# Patient Record
Sex: Male | Born: 1935 | Race: White | Hispanic: No | Smoking: Never smoker
Health system: Southern US, Community
[De-identification: ages and names within clinical notes are randomized; demographics above are authoritative.]

## PROBLEM LIST (undated history)

## (undated) DIAGNOSIS — I219 Acute myocardial infarction, unspecified: Secondary | ICD-10-CM

## (undated) DIAGNOSIS — L570 Actinic keratosis: Secondary | ICD-10-CM

## (undated) DIAGNOSIS — C8590 Non-Hodgkin lymphoma, unspecified, unspecified site: Secondary | ICD-10-CM

## (undated) HISTORY — DX: Non-Hodgkin lymphoma, unspecified, unspecified site: C85.90

## (undated) HISTORY — PX: CARDIAC SURGERY: SHX584

## (undated) HISTORY — DX: Actinic keratosis: L57.0

## (undated) HISTORY — PX: BACK SURGERY: SHX140

## (undated) HISTORY — PX: PROSTATE SURGERY: SHX751

## (undated) HISTORY — DX: Acute myocardial infarction, unspecified: I21.9

---

## 1989-10-03 DIAGNOSIS — C8590 Non-Hodgkin lymphoma, unspecified, unspecified site: Secondary | ICD-10-CM

## 1989-10-03 DIAGNOSIS — I2510 Atherosclerotic heart disease of native coronary artery without angina pectoris: Secondary | ICD-10-CM | POA: Diagnosis present

## 1989-10-03 DIAGNOSIS — R079 Chest pain, unspecified: Secondary | ICD-10-CM | POA: Diagnosis present

## 1989-10-03 HISTORY — DX: Non-Hodgkin lymphoma, unspecified, unspecified site: C85.90

## 2006-03-28 ENCOUNTER — Other Ambulatory Visit: Payer: Self-pay

## 2006-03-28 ENCOUNTER — Emergency Department: Payer: Self-pay | Admitting: Unknown Physician Specialty

## 2006-03-29 ENCOUNTER — Ambulatory Visit: Payer: Self-pay | Admitting: Unknown Physician Specialty

## 2006-03-30 ENCOUNTER — Ambulatory Visit: Payer: Self-pay | Admitting: Internal Medicine

## 2006-03-31 ENCOUNTER — Ambulatory Visit: Payer: Self-pay | Admitting: Surgery

## 2006-04-09 ENCOUNTER — Other Ambulatory Visit: Payer: Self-pay

## 2006-04-09 ENCOUNTER — Inpatient Hospital Stay: Payer: Self-pay | Admitting: Internal Medicine

## 2006-10-03 HISTORY — PX: ROTATOR CUFF REPAIR: SHX139

## 2007-01-08 ENCOUNTER — Ambulatory Visit: Payer: Self-pay | Admitting: Internal Medicine

## 2007-01-22 ENCOUNTER — Ambulatory Visit: Payer: Self-pay | Admitting: Internal Medicine

## 2007-01-23 ENCOUNTER — Ambulatory Visit: Payer: Self-pay | Admitting: Unknown Physician Specialty

## 2007-01-30 ENCOUNTER — Ambulatory Visit: Payer: Self-pay | Admitting: Unknown Physician Specialty

## 2007-02-23 ENCOUNTER — Ambulatory Visit: Payer: Self-pay | Admitting: Unknown Physician Specialty

## 2007-02-23 ENCOUNTER — Other Ambulatory Visit: Payer: Self-pay

## 2007-02-27 ENCOUNTER — Ambulatory Visit: Payer: Self-pay | Admitting: Unknown Physician Specialty

## 2007-03-09 ENCOUNTER — Encounter: Payer: Self-pay | Admitting: General Practice

## 2007-04-03 ENCOUNTER — Encounter: Payer: Self-pay | Admitting: General Practice

## 2007-05-04 ENCOUNTER — Encounter: Payer: Self-pay | Admitting: General Practice

## 2007-06-04 ENCOUNTER — Encounter: Payer: Self-pay | Admitting: General Practice

## 2007-06-12 ENCOUNTER — Ambulatory Visit: Payer: Self-pay | Admitting: Pain Medicine

## 2007-07-04 ENCOUNTER — Encounter: Payer: Self-pay | Admitting: General Practice

## 2007-08-02 ENCOUNTER — Ambulatory Visit: Payer: Self-pay | Admitting: Pain Medicine

## 2007-08-16 ENCOUNTER — Ambulatory Visit: Payer: Self-pay | Admitting: Physician Assistant

## 2007-09-13 ENCOUNTER — Ambulatory Visit: Payer: Self-pay | Admitting: Physician Assistant

## 2009-06-15 ENCOUNTER — Ambulatory Visit: Payer: Self-pay | Admitting: Physician Assistant

## 2009-06-15 ENCOUNTER — Inpatient Hospital Stay: Payer: Self-pay | Admitting: Internal Medicine

## 2009-06-17 DIAGNOSIS — I219 Acute myocardial infarction, unspecified: Secondary | ICD-10-CM

## 2009-06-17 HISTORY — DX: Acute myocardial infarction, unspecified: I21.9

## 2009-07-30 ENCOUNTER — Encounter: Payer: Self-pay | Admitting: Internal Medicine

## 2009-08-03 ENCOUNTER — Encounter: Payer: Self-pay | Admitting: Internal Medicine

## 2009-09-02 ENCOUNTER — Encounter: Payer: Self-pay | Admitting: Internal Medicine

## 2010-02-17 ENCOUNTER — Ambulatory Visit: Payer: Self-pay | Admitting: Vascular Surgery

## 2010-02-19 ENCOUNTER — Ambulatory Visit: Payer: Self-pay | Admitting: Vascular Surgery

## 2010-02-24 ENCOUNTER — Inpatient Hospital Stay: Payer: Self-pay | Admitting: Vascular Surgery

## 2010-02-24 HISTORY — PX: CAROTID ARTERY ANGIOPLASTY: SHX1300

## 2010-05-13 ENCOUNTER — Emergency Department: Payer: Self-pay | Admitting: Emergency Medicine

## 2010-10-03 HISTORY — PX: EYE SURGERY: SHX253

## 2010-10-03 HISTORY — PX: CATARACT EXTRACTION: SUR2

## 2011-03-31 ENCOUNTER — Ambulatory Visit: Payer: Self-pay | Admitting: Ophthalmology

## 2011-04-18 ENCOUNTER — Ambulatory Visit: Payer: Self-pay | Admitting: Ophthalmology

## 2011-05-11 ENCOUNTER — Ambulatory Visit: Payer: Self-pay | Admitting: Ophthalmology

## 2015-02-23 ENCOUNTER — Ambulatory Visit: Payer: Medicare PPO | Attending: Surgery

## 2015-02-23 DIAGNOSIS — M545 Low back pain, unspecified: Secondary | ICD-10-CM

## 2015-02-23 DIAGNOSIS — M47816 Spondylosis without myelopathy or radiculopathy, lumbar region: Secondary | ICD-10-CM | POA: Diagnosis not present

## 2015-02-23 NOTE — Therapy (Signed)
[place] MAIN [facility] SERVICES [address] [place], [place], [address] Phone: [phone]   Fax:  [phone]  Physical Therapy Evaluation  Patient Details  Name: Travis Schultz MRN: [id number] Date of Birth: July 16, 1926 Referring Provider:  Corky Mull, MD  Encounter Date: 02/23/2015      PT End of Session - 02/23/15 0949    Visit Number 1   Number of Visits 9   Date for PT Re-Evaluation 03/26/15   Authorization Type 1/10   PT Start Time 0800   PT Stop Time 0858   PT Time Calculation (min) 58 min   Activity Tolerance Patient tolerated treatment well   Behavior During Therapy [facility] for tasks assessed/performed      Past Medical History  Diagnosis Date  . Myocardial infarction 2012  . Lymphoma 1991    No past surgical history on file.  There were no vitals filed for this visit.  Visit Diagnosis:  Bilateral low back pain without sciatica      Subjective Assessment - 02/23/15 0807    Subjective lumbar surgery 1971- current pain is different. pt reports 6-8 months ago he fell off a ladder, pt reports he didnt have pain at the time. Pt reports he began to have L sidded lower back pain that wraps around the L hip and sometimes across his back. pt reports the pain is constant and mild  to the L lower back and sometimes is severe  when he does abrupt movements. pt reports he does not have numbness/tingling. pt denies any weakness. pt denies any changea in bowel or bladder. pt reports he can go hit golf balls but is very sore afterwards. pt reports he can sit to relieve the pain sometimes.    Patient Stated Goals reduce pain,    Currently in Pain? Yes   Pain Location Back   Pain Orientation Lower   Pain Descriptors / Indicators Aching   Aggravating Factors  unknown- sometimes abrupt movements    Pain Relieving Factors unknown            [facility] PT Assessment - 02/23/15 0819    Assessment   Medical Diagnosis lower back pain   Onset Date/Surgical Date 08/25/14   Balance Screen   Has the patient fallen in the past 6 months No   Has the patient had a decrease in activity level because of a fear of falling?  No   Is the patient reluctant to leave their home because of a fear of falling?  No   Home Environment   Living Environment Private residence   Living Arrangements Spouse/significant other   Home Access Level entry   [place] One level   Prior Function   Level of Independence Independent;Independent with basic ADLs  golf withoutpain   Observation/Other Assessments   Observations --  guarded movement   ROM / Strength   AROM / PROM / Strength Strength;AROM   AROM   Overall AROM  Deficits   Overall AROM Comments lumbar flexion 50 deg, extension 15 deg, R/L sidebend 30 deg, hip PROM WNL,    Strength   Overall Strength Comments hip flexion 4/5, remaining LE strength 5/5, abdominal strength 2/5, extensor strength 3/5, difficutly initiating TA contraction   Palpation   SI assessment  + LLL test, MET attempted, did not reduce- will retry next visit  - posterior shear, - figure 4, - sacral thrust   Palpation comment pt did not have particular area of tenderness  along L spine, hips, SIJ or surrounding musculature. pt is hypomobile throughout   Ambulation/Gait   Ambulation/Gait Yes   Ambulation/Gait Assistance 7: Independent   Gait Pattern --  reduced pelvic rotation, reduced arm swing   Functional Gait  Assessment   Gait assessed  --      repeated lumbar flexion increased pain, repeated lumbar extension increased pain Myotomes/dermatomes WNL bilaterally Reflexes 1+/bilaterally patellar and achilles Hip strength: hip abd 4/5, adduction 4/5, ER 3+/5, IR 3+/5  Therex: Transverse abdominis contraction in supine, mod verbal, tactile cues for performance and maintaining breath.                           PT Long Term Goals - 03-23-2015 0956    PT LONG TERM GOAL #1   Title pt will  demonstrate full lumbar flexion with pain <3/10 order to pick up a golf ball x5.    Time 4   Period Weeks   Status New   PT LONG TERM GOAL #2   Title pt will demo proper lifting mechanics of 20lb item x 5.    Time 4   Period Weeks   Status New   PT LONG TERM GOAL #3   Title pt will swing a golf club x 5 without pain >3/10 to return to PLOF   Time 4   Period Weeks   Status New   PT LONG TERM GOAL #4   Title pt will perfrom a sit up with arms outstretched demonstrating 3/5 abdominal strength significantly increasing core strength.    Baseline 2/5   Time 4   Period Weeks   Status New               Plan - 2015/03/23 0949    Clinical Impression Statement pt presents with what is now chronic L back pain, mostly L sided. pt is somewhat a poor historian, gathering information on aggs/eases was difficutly. pt did not have response with repeated movement testing, no particular areas of tenderness with bony of soft tissue palpation. pt does demonstrate  reduced ROM, joint mobility, impaired flexibility, impaired strength, especially of the core,. pt does not have symptoms of sciatic symptoms, no motor loss or sensation changes. pt does have + LLL testing for pelvic inomonate rotation.     Pt will benefit from skilled therapeutic intervention in order to improve on the following deficits Decreased range of motion;Pain;Impaired flexibility;Improper body mechanics;Postural dysfunction;Decreased strength;Decreased mobility;Decreased activity tolerance   Rehab Potential Good   PT Frequency 2x / week   PT Duration 4 weeks   PT Treatment/Interventions Traction;Moist Heat;Therapeutic exercise;Manual techniques;Taping;Therapeutic activities;Electrical Stimulation;Aquatic Therapy;Ultrasound;Neuromuscular re-education;Passive range of motion;Dry needling;Patient/family education;Gait training;Functional mobility training   PT Next Visit Plan re-try MET to correct pelvic inomonate    Consulted and Agree  with Plan of Care Patient          G-Codes - 03/23/15 1003    Functional Assessment Tool Used ROM, pain scale, clinical judement    Functional Limitation Changing and maintaining body position   Changing and Maintaining Body Position Current Status (E3154) At least 20 percent but less than 40 percent impaired, limited or restricted   Changing and Maintaining Body Position Goal Status (M0867) At least 1 percent but less than 20 percent impaired, limited or restricted       Problem List There are no active problems to display for this patient.  Gorden Harms. Dereon Williamsen, PT, DPT [phone]  Abigael Mogle 2015/03/23,  10:05 AM  [place] MAIN [facility] SERVICES [address] [place], [place], [address] Phone: [phone]   Fax:  [phone]

## 2015-02-23 NOTE — Patient Instructions (Signed)
TA contraction with breathing, diaphragmatic breathing x 5 min

## 2015-03-09 ENCOUNTER — Ambulatory Visit: Payer: Medicare PPO | Attending: Surgery

## 2015-03-09 DIAGNOSIS — M545 Low back pain, unspecified: Secondary | ICD-10-CM

## 2015-03-09 NOTE — Therapy (Signed)
[place] MAIN [facility] SERVICES [address] [place], [place], [address] Phone: [phone]   Fax:  [phone]  Physical Therapy Treatment  Patient Details  Name: Travis Schultz MRN: [id number] Date of Birth: July 23, 1926 Referring Provider:  Corky Mull, MD  Encounter Date: 03/09/2015      PT End of Session - 03/09/15 0845    Visit Number 2   Number of Visits 9   Date for PT Re-Evaluation 03/26/15   Authorization Type 2/10   PT Start Time 0800   PT Stop Time 0835   PT Time Calculation (min) 35 min   Activity Tolerance Patient tolerated treatment well   Behavior During Therapy [facility] for tasks assessed/performed      Past Medical History  Diagnosis Date  . Myocardial infarction 2012  . Lymphoma 1991    History reviewed. No pertinent past surgical history.  There were no vitals filed for this visit.  Visit Diagnosis:  Bilateral low back pain without sciatica      Subjective Assessment - 03/09/15 0840    Subjective pt reports he has been doing his initial HEP at home and it seems to be helping his R flank pain.    Patient Stated Goals reduce pain,    Currently in Pain? Yes   Pain Score 3    Pain Location Back   Pain Orientation Lower;Right     Therex: Alt UE flexion 2x10, bridges 2x10, alt LE march 2x10, LTR  2x10, double knee to chest 2x10  Pt required mod cues for proper exercise performance both verbal and tactile                    [facility] Adult PT Treatment/Exercise - 03/09/15 0001    Exercises   Exercises Lumbar   Lumbar Exercises: Supine   Bridge --  2x10                PT Education - 03/09/15 0845    Education provided Yes   Education Details diaphragmatic breathing    Person(s) Educated Patient   Methods Explanation   Comprehension Verbalized understanding;Returned demonstration             PT Long Term Goals - 02/23/15 0956    PT LONG TERM GOAL #1   Title pt will  demonstrate full lumbar flexion with pain <3/10 order to pick up a golf ball x5.    Time 4   Period Weeks   Status New   PT LONG TERM GOAL #2   Title pt will demo proper lifting mechanics of 20lb item x 5.    Time 4   Period Weeks   Status New   PT LONG TERM GOAL #3   Title pt will swing a golf club x 5 without pain >3/10 to return to PLOF   Time 4   Period Weeks   Status New   PT LONG TERM GOAL #4   Title pt will perfrom a sit up with arms outstretched demonstrating 3/5 abdominal strength significantly increasing core strength.    Baseline 2/5   Time 4   Period Weeks   Status New               Plan - 03/09/15 4259    Clinical Impression Statement pt tolerated progression of core strengthening well, did not have increased pain. pt had difficulty maintaining normal breathing with TA contractions needing frequent verbal and tactile cues. still unable to reproduce  specific "grabbing" pain.    Pt will benefit from skilled therapeutic intervention in order to improve on the following deficits Decreased range of motion;Pain;Impaired flexibility;Improper body mechanics;Postural dysfunction;Decreased strength;Decreased mobility;Decreased activity tolerance   Rehab Potential Good   PT Frequency 2x / week   PT Duration 4 weeks   PT Treatment/Interventions Traction;Moist Heat;Therapeutic exercise;Manual techniques;Taping;Therapeutic activities;Electrical Stimulation;Aquatic Therapy;Ultrasound;Neuromuscular re-education;Passive range of motion;Dry needling;Patient/family education;Gait training;Functional mobility training   PT Next Visit Plan re-try MET to correct pelvic inomonate         Problem List There are no active problems to display for this patient.  Gorden Harms. Ruffin Lada, PT, DPT [phone]  Jafet Wissing 03/09/2015, 8:52 AM  [place] MAIN [facility] SERVICES [address] [place], [place], [address] Phone: [phone]   Fax:   [phone]

## 2015-03-09 NOTE — Patient Instructions (Addendum)
[email or web address] transverse abdominis contractions with the following: Alt UE flexion 2x10, bridges 2x10, alt LE march 2x10, LTR  2x10, double knee to chest 2x10

## 2015-03-12 ENCOUNTER — Ambulatory Visit: Payer: Medicare PPO

## 2015-03-12 DIAGNOSIS — M545 Low back pain, unspecified: Secondary | ICD-10-CM

## 2015-03-12 NOTE — Therapy (Signed)
[place] MAIN [facility] SERVICES [address] [place], [place], [address] Phone: [phone]   Fax:  [phone]  Physical Therapy Treatment  Patient Details  Name: REQUAN HARDGE MRN: [id number] Date of Birth: 1925/11/23 Referring Provider:  Corky Mull, MD  Encounter Date: 03/12/2015      PT End of Session - 03/12/15 0859    Visit Number 3   Number of Visits 9   Date for PT Re-Evaluation 03/26/15   Authorization Type 3/10   PT Start Time 0802   PT Stop Time 0850   PT Time Calculation (min) 48 min   Activity Tolerance Patient tolerated treatment well   Behavior During Therapy [facility] for tasks assessed/performed      Past Medical History  Diagnosis Date  . Myocardial infarction 2012  . Lymphoma 1991    History reviewed. No pertinent past surgical history.  There were no vitals filed for this visit.  Visit Diagnosis:  Bilateral low back pain without sciatica      Subjective Assessment - 03/12/15 0857    Subjective pt reports he played 9 holes of golf and his L lower back did hurt, but he was able to finish the game.    Patient Stated Goals reduce pain,    Currently in Pain? Yes   Pain Score 3    Pain Location Back   Pain Orientation Lower;Left   Pain Descriptors / Indicators Aching      Therex: PT assessed pelvic alignment: + LLL on the L side MET to correct inomonate rotation followed by shotgun technique which was unsuccessful x 2. Pt then issued R heel lift  Diaphragmatic breathing x 2 min cues min verbal cues and visual cues with hand on abdomin.  Continue diaphragmatic breathing through exercises perform exercise on the exhale: Including: alt LE march, bridges, alt UE flexion, LTR  piriformis stretching 30 s x 2 QL stretching in sidelyingand standing 30s x 2 each Pt reports his pain changed post exercises, reports pain is more central but bilateral at 3/10 Pt required min-max cues at time throughout exercises.                            PT Education - 03/12/15 0858    Education provided Yes   Education Details diaphragmatic breathing during exercises. !   Person(s) Educated Patient   Methods Explanation;Tactile cues;Verbal cues   Comprehension Verbalized understanding;Returned demonstration;Verbal cues required             PT Long Term Goals - 02/23/15 0956    PT LONG TERM GOAL #1   Title pt will demonstrate full lumbar flexion with pain <3/10 order to pick up a golf ball x5.    Time 4   Period Weeks   Status New   PT LONG TERM GOAL #2   Title pt will demo proper lifting mechanics of 20lb item x 5.    Time 4   Period Weeks   Status New   PT LONG TERM GOAL #3   Title pt will swing a golf club x 5 without pain >3/10 to return to PLOF   Time 4   Period Weeks   Status New   PT LONG TERM GOAL #4   Title pt will perfrom a sit up with arms outstretched demonstrating 3/5 abdominal strength significantly increasing core strength.    Baseline 2/5   Time 4   Period Weeks  Status New               Plan - 03/12/15 0859    Clinical Impression Statement modifed therex today to include diaphragmatic breathing throughout exercises with exertion on the exhale as pt continues to not be able to maintain breath and TA contraction during exercises. pt improved his ability with this as session continued. pt reported having less pain following therex in supine, but then more pain bilaterally as he stood up. pt reported feeling a "big stretch" with the QL stretching on the L side. unable to reduce pelvic inomonate rotation today with MET. x 2. PT issued heel lift on the R to compensate for this.    Pt will benefit from skilled therapeutic intervention in order to improve on the following deficits Decreased range of motion;Pain;Impaired flexibility;Improper body mechanics;Postural dysfunction;Decreased strength;Decreased mobility;Decreased activity tolerance   Rehab Potential  Good   PT Frequency 2x / week   PT Duration 4 weeks   PT Treatment/Interventions Traction;Moist Heat;Therapeutic exercise;Manual techniques;Taping;Therapeutic activities;Electrical Stimulation;Aquatic Therapy;Ultrasound;Neuromuscular re-education;Passive range of motion;Dry needling;Patient/family education;Gait training;Functional mobility training   PT Next Visit Plan --        Problem List There are no active problems to display for this patient.  Gorden Harms. Arnette Driggs, PT, DPT [phone]  Rut Betterton 03/12/2015, 9:02 AM  [place] [facility] MAIN [facility] SERVICES [address] [place], [place], [address] Phone: [phone]   Fax:  [phone]

## 2015-03-16 ENCOUNTER — Ambulatory Visit: Payer: Medicare PPO

## 2015-03-16 DIAGNOSIS — M545 Low back pain, unspecified: Secondary | ICD-10-CM

## 2015-03-16 NOTE — Therapy (Signed)
[place] MAIN [facility] SERVICES [address] [place], [place], [address] Phone: [phone]   Fax:  [phone]  Physical Therapy Treatment  Patient Details  Name: Travis Schultz MRN: [id number] Date of Birth: 1925/12/15 Referring Provider:  Corky Mull, MD  Encounter Date: 03/16/2015      PT End of Session - 03/16/15 0911    Visit Number 4   Number of Visits 9   Date for PT Re-Evaluation 03/26/15   Authorization Type 4/10   PT Start Time 0810   PT Stop Time 0852   PT Time Calculation (min) 42 min   Activity Tolerance Patient tolerated treatment well   Behavior During Therapy [facility] for tasks assessed/performed      Past Medical History  Diagnosis Date  . Myocardial infarction 2012  . Lymphoma 1991    History reviewed. No pertinent past surgical history.  There were no vitals filed for this visit.  Visit Diagnosis:  Bilateral low back pain without sciatica      Subjective Assessment - 03/16/15 0843    Subjective pt reports he is having the sharp catching in his back much frequently. he feels stif but is having less pain.    Patient Stated Goals reduce pain,    Currently in Pain? Yes   Pain Score 2    Pain Location Back   Pain Orientation Lower   Pain Descriptors / Indicators Aching     Treatment: LTR with Tball 4x10 Transverse abdominis contraction (TrA) with alt LE/UE marching 2x10 TrA contraction with mini bridging 2x10 TrA contraction with clamshell 2x10 bilaterally  Pavloff press : red band 2x10  Pt requires mod cues to maintain diaphragmatic breathing and proper exercise performance.                             PT Education - 03/16/15 0908    Education provided Yes   Education Details wallet in front pocket due to pelvic inominate, diaphragmatic breathing.     Person(s) Educated Patient   Methods Explanation   Comprehension Verbalized understanding;Returned demonstration              PT Long Term Goals - 02/23/15 0956    PT LONG TERM GOAL #1   Title pt will demonstrate full lumbar flexion with pain <3/10 order to pick up a golf ball x5.    Time 4   Period Weeks   Status New   PT LONG TERM GOAL #2   Title pt will demo proper lifting mechanics of 20lb item x 5.    Time 4   Period Weeks   Status New   PT LONG TERM GOAL #3   Title pt will swing a golf club x 5 without pain >3/10 to return to PLOF   Time 4   Period Weeks   Status New   PT LONG TERM GOAL #4   Title pt will perfrom a sit up with arms outstretched demonstrating 3/5 abdominal strength significantly increasing core strength.    Baseline 2/5   Time 4   Period Weeks   Status New               Plan - 03/16/15 0913    Clinical Impression Statement pt still has difficutly maintaining normal breath volume with diaphragmatic breathing. pt still needs mod to max cues to perform exercises correctly. did progress hip and core strengthening today. pt progressing as he  is having less sharp catching pain, and more stiffness. vs pain.    Pt will benefit from skilled therapeutic intervention in order to improve on the following deficits Decreased range of motion;Pain;Impaired flexibility;Improper body mechanics;Postural dysfunction;Decreased strength;Decreased mobility;Decreased activity tolerance   Rehab Potential Good   PT Frequency 2x / week   PT Duration 4 weeks   PT Treatment/Interventions Traction;Moist Heat;Therapeutic exercise;Manual techniques;Taping;Therapeutic activities;Electrical Stimulation;Aquatic Therapy;Ultrasound;Neuromuscular re-education;Passive range of motion;Dry needling;Patient/family education;Gait training;Functional mobility training    plan: next visit try posterior pelvic tilt, modify/correct exercises     Problem List There are no active problems to display for this patient.   Daphanie Oquendo 03/16/2015, 9:20 AM  [place] MAIN [facility] SERVICES [address] [place], [place], [address] Phone: [phone]   Fax:  [phone]

## 2015-03-19 ENCOUNTER — Ambulatory Visit: Payer: Medicare PPO

## 2015-03-19 DIAGNOSIS — M545 Low back pain, unspecified: Secondary | ICD-10-CM

## 2015-03-19 NOTE — Therapy (Signed)
[place] MAIN [facility] SERVICES [address] [place], [place], [address] Phone: [phone]   Fax:  [phone]  Physical Therapy Treatment  Patient Details  Name: Travis Schultz MRN: [id number] Date of Birth: [birth year] Referring Provider:  Corky Mull, MD  Encounter Date: 03/19/2015      PT End of Session - 03/19/15 0918    Visit Number 5   Number of Visits 9   Date for PT Re-Evaluation 03/26/15   Authorization Type 5/10   PT Start Time 0804   PT Stop Time 5053   PT Time Calculation (min) 40 min   Activity Tolerance Patient tolerated treatment well;No increased pain   Behavior During Therapy [facility] for tasks assessed/performed      Past Medical History  Diagnosis Date  . Myocardial infarction 2012  . Lymphoma 1991    History reviewed. No pertinent past surgical history.  There were no vitals filed for this visit.  Visit Diagnosis:  Bilateral low back pain without sciatica      Subjective Assessment - 03/19/15 0914    Subjective pt reports he had some increased pain after last session when he got in his car, but no more since then. pt is happy with his progress.    Patient Stated Goals reduce pain,    Currently in Pain? No/denies     Therex: DKTC with PT assist 10s x 5 (performed at start and end of session) Posterior pelvic tilt 5s x 10 (performed at start and end of session) Seated on Tball alt LE march with diaphragmatic breathing 2x10 Seated on Tball over headh arm lift with diaphragmatic breathing Seated on Tball pavloff press red band 2x x10 bilaterally Seated on Tball D2 shoulder fleixon red band 2x10 bilaterally Low row red band 2x10 bilaterally on Tball Pt requried min-mod verbal and tactile cues for TA contraction with diaphragmatic breathing and scapular positioning/ engagement,.                             PT Education - 03/19/15 0916    Education provided Yes   Education Details  diaphragmatic breathing    Person(s) Educated Patient   Methods Explanation;Verbal cues;Tactile cues             PT Long Term Goals - 02/23/15 0956    PT LONG TERM GOAL #1   Title pt will demonstrate full lumbar flexion with pain <3/10 order to pick up a golf ball x5.    Time 4   Period Weeks   Status New   PT LONG TERM GOAL #2   Title pt will demo proper lifting mechanics of 20lb item x 5.    Time 4   Period Weeks   Status New   PT LONG TERM GOAL #3   Title pt will swing a golf club x 5 without pain >3/10 to return to PLOF   Time 4   Period Weeks   Status New   PT LONG TERM GOAL #4   Title pt will perfrom a sit up with arms outstretched demonstrating 3/5 abdominal strength significantly increasing core strength.    Baseline 2/5   Time 4   Period Weeks   Status New               Plan - 03/19/15 9767    Clinical Impression Statement progressed therex today to core strengthening on Tball as well as posture/scapular muscle strenghtening.pt  still needs min-mod cues for maintain diaphragmatic breathing during exercies with verbal and tactile cues, but is improving his coordination somewhat. pt responded well to sunrise stretch, reporting "thats the spot that bothers me" pt points to QL, upon palpation in this position pt was tender. progressed stretching program for home also.    Pt will benefit from skilled therapeutic intervention in order to improve on the following deficits Decreased range of motion;Pain;Impaired flexibility;Improper body mechanics;Postural dysfunction;Decreased strength;Decreased mobility;Decreased activity tolerance   Rehab Potential Good   PT Frequency 2x / week   PT Duration 4 weeks   PT Treatment/Interventions Traction;Moist Heat;Therapeutic exercise;Manual techniques;Taping;Therapeutic activities;Electrical Stimulation;Aquatic Therapy;Ultrasound;Neuromuscular re-education;Passive range of motion;Dry needling;Patient/family education;Gait  training;Functional mobility training        Problem List There are no active problems to display for this patient.  Gorden Harms. Travis Schultz, PT, DPT [phone]   Travis Schultz 03/19/2015, 9:22 AM  [place] MAIN [facility] SERVICES [address] [place], [place], [address] Phone: [phone]   Fax:  [phone]

## 2015-03-19 NOTE — Patient Instructions (Signed)
Sunrise stretch L side 30s x 2 Double knee to chest 10s x 5 Issued from [email or web address]

## 2015-03-23 ENCOUNTER — Ambulatory Visit: Payer: Medicare PPO

## 2015-03-23 DIAGNOSIS — M545 Low back pain, unspecified: Secondary | ICD-10-CM

## 2015-03-23 NOTE — Therapy (Signed)
[place] MAIN [facility] SERVICES [address] [place], [place], [address] Phone: [phone]   Fax:  [phone]  Physical Therapy Treatment  Patient Details  Name: AYOUB AREY MRN: [id number] Date of Birth: Dec 16, 1925 Referring Provider:  Corky Mull, MD  Encounter Date: 03/23/2015      PT End of Session - 03/23/15 0905    Visit Number 6   Number of Visits 9   Date for PT Re-Evaluation 03/26/15   Authorization Type 6/10   PT Start Time 0804   PT Stop Time 8338   PT Time Calculation (min) 45 min   Activity Tolerance Patient tolerated treatment well;No increased pain   Behavior During Therapy [facility] for tasks assessed/performed      Past Medical History  Diagnosis Date  . Myocardial infarction 2012  . Lymphoma 1991    History reviewed. No pertinent past surgical history.  There were no vitals filed for this visit.  Visit Diagnosis:  Bilateral low back pain without sciatica      Subjective Assessment - 03/23/15 0903    Subjective pt reports he has been doing his new stretches at home. pt reports only mild pain and tightness, no more catching in the back.    Patient Stated Goals reduce pain,    Pain Score 1    Pain Location Back      Therex:  DKTC with PT assist 10s x 5  Posterior pelvic tilt 5s x 10 on Tball  Seated on Tball alt LE march with diaphragmatic breathing 2x10 Seated on Tball pavloff press red band 2x x10 bilaterally Seated on Tball D2 shoulder fleixon red band 2x10 bilaterally Shoulder horiz abduction red band 2x10 Low row red band 2x10 bilaterally on Tball Ball bridges 2x10 Sunrise stretch x 1 min each side with contract relax for QL  Pt requried min-mod verbal and tactile cues for TA contraction with diaphragmatic breathing and scapular positioning/ engagement,.                           PT Education - 03/23/15 0904    Education provided Yes   Education Details diaphragmatic  breathing  during exertion    Methods Explanation;Verbal cues;Tactile cues   Comprehension Verbalized understanding;Returned demonstration;Verbal cues required;Tactile cues required             PT Long Term Goals - 02/23/15 0956    PT LONG TERM GOAL #1   Title pt will demonstrate full lumbar flexion with pain <3/10 order to pick up a golf ball x5.    Time 4   Period Weeks   Status New   PT LONG TERM GOAL #2   Title pt will demo proper lifting mechanics of 20lb item x 5.    Time 4   Period Weeks   Status New   PT LONG TERM GOAL #3   Title pt will swing a golf club x 5 without pain >3/10 to return to PLOF   Time 4   Period Weeks   Status New   PT LONG TERM GOAL #4   Title pt will perfrom a sit up with arms outstretched demonstrating 3/5 abdominal strength significantly increasing core strength.    Baseline 2/5   Time 4   Period Weeks   Status New               Plan - 03/23/15 0905    Clinical Impression Statement pt still  requires mod cues for proper breathing during exercises to promote TA contraction. pt needs verbal, visual and tactile cues, but is slowely improving. pt no longer has sharp instances of catching pain in his back, but still mild pain/tightness.    Pt will benefit from skilled therapeutic intervention in order to improve on the following deficits Decreased range of motion;Pain;Impaired flexibility;Improper body mechanics;Postural dysfunction;Decreased strength;Decreased mobility;Decreased activity tolerance   Rehab Potential Good   PT Frequency 2x / week   PT Duration 4 weeks   PT Treatment/Interventions Traction;Moist Heat;Therapeutic exercise;Manual techniques;Taping;Therapeutic activities;Electrical Stimulation;Aquatic Therapy;Ultrasound;Neuromuscular re-education;Passive range of motion;Dry needling;Patient/family education;Gait training;Functional mobility training        Problem List There are no active problems to display for this  patient.  Gorden Harms. Kylie Simmonds, PT, DPT [phone]  Brenley Priore 03/23/2015, 9:09 AM  [place] MAIN [facility] SERVICES [address] [place], [place], [address] Phone: [phone]   Fax:  [phone]

## 2015-03-26 ENCOUNTER — Ambulatory Visit: Payer: Medicare PPO

## 2015-03-26 DIAGNOSIS — M545 Low back pain, unspecified: Secondary | ICD-10-CM

## 2015-03-26 NOTE — Therapy (Signed)
[place] MAIN [facility] SERVICES [address] [place], [place], [address] Phone: [phone]   Fax:  [phone]  Physical Therapy Treatment  Patient Details  Name: Travis Schultz MRN: [id number] Date of Birth: 12-18-1925 Referring Provider:  Corky Mull, MD  Encounter Date: 03/26/2015      PT End of Session - 03/26/15 1236    Visit Number 7   Number of Visits 9   Date for PT Re-Evaluation 03/26/15   Authorization Type 7/10   PT Start Time 0802   PT Stop Time 7124   PT Time Calculation (min) 42 min   Activity Tolerance Patient tolerated treatment well;No increased pain   Behavior During Therapy [facility] for tasks assessed/performed      Past Medical History  Diagnosis Date  . Myocardial infarction 2012  . Lymphoma 1991    History reviewed. No pertinent past surgical history.  There were no vitals filed for this visit.  Visit Diagnosis:  Bilateral low back pain without sciatica      Subjective Assessment - 03/26/15 1235    Subjective pt reports he no longer is having much of the L flank pain, just c/o stiffness in the middle of his lower back which eases with movement.    Patient Stated Goals reduce pain,    Currently in Pain? Yes   Pain Score 2    Pain Location Back   Pain Orientation Lower   Pain Descriptors / Indicators --  stiff     Therex: DKTC 10s x 10 LTR with Tball 2x10 Posterior pelvic tilt 5s x 10 Ball bridge 2x10 Alt LE/UE flexion on Tball 2x10 Golf swing with cues for TA contraction  X 8 min Pt education with demonstration/pt performance of his usual exercise routine including bicep curls, chest press etc. With instruction to exhale on the contraction to engage transverse abdominis- pt needs mod -max cues at times.                             PT Education - 03/26/15 1236    Education provided Yes   Education Details pt continues to struggle to perform exercises upon exhale, pt again  educated in this including his golf swing    Person(s) Educated Patient   Methods Explanation;Demonstration   Comprehension Verbalized understanding;Returned demonstration;Verbal cues required;Tactile cues required;Need further instruction             PT Long Term Goals - 02/23/15 0956    PT LONG TERM GOAL #1   Title pt will demonstrate full lumbar flexion with pain <3/10 order to pick up a golf ball x5.    Time 4   Period Weeks   Status New   PT LONG TERM GOAL #2   Title pt will demo proper lifting mechanics of 20lb item x 5.    Time 4   Period Weeks   Status New   PT LONG TERM GOAL #3   Title pt will swing a golf club x 5 without pain >3/10 to return to PLOF   Time 4   Period Weeks   Status New   PT LONG TERM GOAL #4   Title pt will perfrom a sit up with arms outstretched demonstrating 3/5 abdominal strength significantly increasing core strength.    Baseline 2/5   Time 4   Period Weeks   Status New  Plan - 03/26/15 1237    Clinical Impression Statement pt still struggles to perform exercise contractions with exhale at times, needing cues. did progress to his golf swing today with exhale on the swing. pt advised to practice this and his other exercises with focus on performing the contraction upon exhale. pt is progresing however, with regarding to reduced symptom severity and frequency.    Pt will benefit from skilled therapeutic intervention in order to improve on the following deficits Decreased range of motion;Pain;Impaired flexibility;Improper body mechanics;Postural dysfunction;Decreased strength;Decreased mobility;Decreased activity tolerance   Rehab Potential Good   PT Frequency 2x / week   PT Duration 4 weeks   PT Treatment/Interventions Traction;Moist Heat;Therapeutic exercise;Manual techniques;Taping;Therapeutic activities;Electrical Stimulation;Aquatic Therapy;Ultrasound;Neuromuscular re-education;Passive range of motion;Dry  needling;Patient/family education;Gait training;Functional mobility training        Problem List There are no active problems to display for this patient.  Gorden Harms. Velvia Mehrer, PT, DPT [phone]  Romaine Maciolek 03/26/2015, 12:41 PM  [place] MAIN [facility] SERVICES [address] [place], [place], [address] Phone: [phone]   Fax:  [phone]

## 2015-03-30 ENCOUNTER — Ambulatory Visit: Payer: Medicare PPO

## 2015-03-30 DIAGNOSIS — M545 Low back pain, unspecified: Secondary | ICD-10-CM

## 2015-03-30 NOTE — Therapy (Signed)
[place] MAIN [facility] SERVICES [address] [place], [place], [address] Phone: [phone]   Fax:  [phone]  Physical Therapy Treatment  Patient Details  Name: Travis Schultz MRN: [id number] Date of Birth: October 20, 1925 Referring Provider:  Corky Mull, MD  Encounter Date: 03/30/2015      PT End of Session - 03/30/15 1110    Visit Number 8   Number of Visits 9   Date for PT Re-Evaluation 04/27/15   Authorization Type 1/10   PT Start Time 0802   PT Stop Time 0840   PT Time Calculation (min) 38 min   Activity Tolerance Patient tolerated treatment well;No increased pain   Behavior During Therapy [facility] for tasks assessed/performed      Past Medical History  Diagnosis Date  . Myocardial infarction 2012  . Lymphoma 1991    History reviewed. No pertinent past surgical history.  There were no vitals filed for this visit.  Visit Diagnosis:  Bilateral low back pain without sciatica - Plan: PT plan of care cert/re-cert      Subjective Assessment - 03/30/15 1108    Subjective pt reportshe had an increase in his lower back pain over the weekend. pt reports no specific event, but it just feels sore and stiff.    Patient Stated Goals reduce pain,    Currently in Pain? Yes   Pain Score 3    Pain Location Back     Manual therapy Soft tissue massage and IASTM using edge tool to the paraspinals  And QL, including efflurage, petrusague, muscle stripping Myofascial release to the thoracolumbar fascia  Pt needed cues to relax during manual therapy. Pt particularly tender L QL                           PT Education - 03/30/15 1109    Education provided Yes   Education Details lumbar flexion stretching and quadratus lumborum stretching, modified over a smaller boulster than hes been doing.    Person(s) Educated Patient   Methods Explanation   Comprehension Verbalized understanding             PT Long Term  Goals - 03/30/15 1121    PT LONG TERM GOAL #1   Title pt will demonstrate full lumbar flexion with pain <3/10 order to pick up a golf ball x5.    Time 4   Period Weeks   Status Partially Met   PT LONG TERM GOAL #2   Title pt will demo proper lifting mechanics of 20lb item x 5.    Time 4   Period Weeks   Status Partially Met   PT LONG TERM GOAL #3   Title pt will swing a golf club x 5 without pain >3/10 to return to PLOF   Time 4   Period Weeks   Status Partially Met   PT LONG TERM GOAL #4   Title pt will perfrom a sit up with arms outstretched demonstrating 3/5 abdominal strength significantly increasing core strength.    Baseline 2/5   Time 4   Period Weeks   Status Partially Met               Plan - 03/30/15 1111    Clinical Impression Statement pt responded well to manual therapy today. pt has soft tissue tightness/ restriction of the paraspinals and QL L>R. emphasized stretching program again today. pt posutre forward flexed at hips likely  increases the muscle tightness. hip flexors not particularily tight upon exam today.  pt has partially met goals at this time and would benefit from continued skilled PT services  to improve impairments.    Pt will benefit from skilled therapeutic intervention in order to improve on the following deficits Decreased range of motion;Pain;Impaired flexibility;Improper body mechanics;Postural dysfunction;Decreased strength;Decreased mobility;Decreased activity tolerance   Rehab Potential Good   PT Frequency 2x / week   PT Duration 4 weeks   PT Treatment/Interventions Traction;Moist Heat;Therapeutic exercise;Manual techniques;Taping;Therapeutic activities;Electrical Stimulation;Aquatic Therapy;Ultrasound;Neuromuscular re-education;Passive range of motion;Dry needling;Patient/family education;Gait training;Functional mobility training          G-Codes - 27-Apr-2015 1122    Functional Assessment Tool Used ROM, pain scale, clinical judement     Functional Limitation Changing and maintaining body position   Changing and Maintaining Body Position Current Status (N2258) At least 20 percent but less than 40 percent impaired, limited or restricted   Changing and Maintaining Body Position Goal Status (T4621) At least 1 percent but less than 20 percent impaired, limited or restricted      Problem List There are no active problems to display for this patient.  Gorden Harms. Irva Loser, PT, DPT [phone]  Niang Mitcheltree April 27, 2015, 11:24 AM  [place] MAIN [facility] SERVICES [address] [place], [place], [address] Phone: [phone]   Fax:  [phone]

## 2015-04-02 ENCOUNTER — Ambulatory Visit: Payer: Medicare PPO

## 2015-04-02 DIAGNOSIS — M545 Low back pain, unspecified: Secondary | ICD-10-CM

## 2015-04-02 NOTE — Therapy (Signed)
[place] MAIN [facility] SERVICES [address] [place], [place], [address] Phone: [phone]   Fax:  [phone]  Physical Therapy Treatment  Patient Details  Name: OBDULIO MASH MRN: [id number] Date of Birth: 10/27/1925 Referring Provider:  Corky Mull, MD  Encounter Date: 04/02/2015      PT End of Session - 04/02/15 1240    Visit Number 9   Number of Visits 17   Date for PT Re-Evaluation 04/27/15   Authorization Type 2/10   PT Start Time 0802   PT Stop Time 0845   PT Time Calculation (min) 43 min   Activity Tolerance Patient tolerated treatment well;No increased pain   Behavior During Therapy [facility] for tasks assessed/performed      Past Medical History  Diagnosis Date  . Myocardial infarction 2012  . Lymphoma 1991    No past surgical history on file.  There were no vitals filed for this visit.  Visit Diagnosis:  Bilateral low back pain without sciatica     manual therapy: Soft tissue massage and ischemic trigger point release to quadratus lumborum (QL) Patient received dry needling therapy education and acknowledged understanding of risks and benefits of dry needling therapy prior to receiving treatment. Patient voiced understanding of treatment options and elected to proceed with dry needling therapy.  triggerpoint dry needling to the L QL in sidelying. Pt reported feeling the associated light dull ache with the technique.  Followed by QL stretch over boulster  Therex: Seated on Tball: Low row: red band 2x10 pavloff press red band 2x10 each side horiz shoulder abudction 2x10 red band  D2 shoulder flexion red band 2x10 Min-mod cues for proper diaphragmatic breathing and scap positioning  Standing D1 shoulder extension (Bilateral) green band 2x10                           PT Education - 04/02/15 1239    Education provided Yes   Education Details dry needling education, diaphragmatic breathing  education   Person(s) Educated Patient   Methods Explanation   Comprehension Returned demonstration;Verbalized understanding;Tactile cues required;Verbal cues required;Need further instruction             PT Long Term Goals - 03/30/15 1121    PT LONG TERM GOAL #1   Title pt will demonstrate full lumbar flexion with pain <3/10 order to pick up a golf ball x5.    Time 4   Period Weeks   Status Partially Met   PT LONG TERM GOAL #2   Title pt will demo proper lifting mechanics of 20lb item x 5.    Time 4   Period Weeks   Status Partially Met   PT LONG TERM GOAL #3   Title pt will swing a golf club x 5 without pain >3/10 to return to PLOF   Time 4   Period Weeks   Status Partially Met   PT LONG TERM GOAL #4   Title pt will perfrom a sit up with arms outstretched demonstrating 3/5 abdominal strength significantly increasing core strength.    Baseline 2/5   Time 4   Period Weeks   Status Partially Met               Plan - 04/02/15 1242    Clinical Impression Statement pt is starting to need less cues for proper performance of this Therex. pt is doing better with diaphragmatic breathing on the concentric part  of exercises. pt responded well to dry needling, reporting reduced stiffness following.    Pt will benefit from skilled therapeutic intervention in order to improve on the following deficits Decreased range of motion;Pain;Impaired flexibility;Improper body mechanics;Postural dysfunction;Decreased strength;Decreased mobility;Decreased activity tolerance   Rehab Potential Good   PT Frequency 2x / week   PT Duration 4 weeks   PT Treatment/Interventions Traction;Moist Heat;Therapeutic exercise;Manual techniques;Taping;Therapeutic activities;Electrical Stimulation;Aquatic Therapy;Ultrasound;Neuromuscular re-education;Passive range of motion;Dry needling;Patient/family education;Gait training;Functional mobility training        Problem List There are no active problems  to display for this patient.  Gorden Harms. Shamar Kracke, PT, DPT [phone]  Shany Marinez 04/02/2015, 12:44 PM  [place] MAIN [facility] SERVICES [address] [place], [place], [address] Phone: [phone]   Fax:  [phone]

## 2015-04-13 ENCOUNTER — Ambulatory Visit: Payer: Medicare PPO | Attending: Surgery

## 2015-04-13 DIAGNOSIS — M545 Low back pain, unspecified: Secondary | ICD-10-CM

## 2015-04-13 NOTE — Therapy (Signed)
[place] MAIN [facility] SERVICES [address] [place], [place], [address] Phone: [phone]   Fax:  [phone]  Physical Therapy Treatment  Patient Details  Name: Travis Schultz MRN: [id number] Date of Birth: 02/11/1926 Referring Provider:  Corky Mull, MD  Encounter Date: 04/13/2015      PT End of Session - 04/13/15 1330    Visit Number 10   Number of Visits 17   Date for PT Re-Evaluation 04/27/15   Authorization Type 3/10   PT Start Time 1117   PT Stop Time 1200   PT Time Calculation (min) 43 min   Activity Tolerance Patient tolerated treatment well;No increased pain   Behavior During Therapy [facility] for tasks assessed/performed      Past Medical History  Diagnosis Date  . Myocardial infarction 2012  . Lymphoma 1991    History reviewed. No pertinent past surgical history.  There were no vitals filed for this visit.  Visit Diagnosis:  Bilateral low back pain without sciatica      Subjective Assessment - 04/13/15 1327    Subjective pt reports his left low back is sore/stiff this morning after weeding and lifting a bucket with his R UE.  pt relates he felt "wonderful" after his last session.    Patient Stated Goals reduce pain,    Pain Score 3    Pain Location Back   Pain Orientation Left        Therex: Seated on Tball: pavloff press red band 2x10 each side horiz shoulder abudction 2x10 red band  D2 shoulder flexion red band 2x10 Min cues for proper diaphragmatic breathing  Posterior pelvic tilts x10, pt required verbal and tactile cues for correct technique  Alternating marching x10, pt required verbal cues to maintain upright posture versus leaning towards the opposite LE   Standing D1 shoulder extension (Bilateral) green band 2x10 Bilateral Lateral trunk rotation with green theraband 2x10, pt required verbal and tactile from rotating hips   Supine: Knees to chest 2x20 sec Bridges x10 Posterior pelvic  tilts, pt required verbal and tactile cueing for T/A activation   Pt is progressing well with there ex, requiring less verbal and tactile cueing.                         PT Education - 04/13/15 1329    Education provided Yes   Education Details how the ther ex involving his UE help impove core strength    Person(s) Educated Patient   Methods Explanation   Comprehension Verbalized understanding             PT Long Term Goals - 03/30/15 1121    PT LONG TERM GOAL #1   Title pt will demonstrate full lumbar flexion with pain <3/10 order to pick up a golf ball x5.    Time 4   Period Weeks   Status Partially Met   PT LONG TERM GOAL #2   Title pt will demo proper lifting mechanics of 20lb item x 5.    Time 4   Period Weeks   Status Partially Met   PT LONG TERM GOAL #3   Title pt will swing a golf club x 5 without pain >3/10 to return to PLOF   Time 4   Period Weeks   Status Partially Met   PT LONG TERM GOAL #4   Title pt will perfrom a sit up with arms outstretched demonstrating 3/5 abdominal strength  significantly increasing core strength.    Baseline 2/5   Time 4   Period Weeks   Status Partially Met               Plan - 04/13/15 1331    Clinical Impression Statement pt is able to perform ther ex with improved form and less verbal and tactile cueing and tolerated progression of strength exercises well with no increase in pain.  pt would continue to benefit from skilled PT services to improve lifting mechanics and remaining deficits.    Pt will benefit from skilled therapeutic intervention in order to improve on the following deficits Decreased range of motion;Pain;Impaired flexibility;Improper body mechanics;Postural dysfunction;Decreased strength;Decreased mobility;Decreased activity tolerance   Rehab Potential Good   PT Frequency 2x / week   PT Duration 4 weeks   PT Treatment/Interventions Traction;Moist Heat;Therapeutic exercise;Manual  techniques;Taping;Therapeutic activities;Electrical Stimulation;Aquatic Therapy;Ultrasound;Neuromuscular re-education;Passive range of motion;Dry needling;Patient/family education;Gait training;Functional mobility training   PT Next Visit Plan lifting mechanics, HEP         Problem List There are no active problems to display for this patient.  Renford Dills, SPT Renford Dills 04/13/2015, 1:36 PM This entire session was performed under direct supervision and direction of a licensed therapist/therapist assistant . I have personally read, edited and approve of the note as written.  Gorden Harms. [place], PT, DPT [phone]  [place] MAIN [facility] SERVICES [address] [place], [place], [address] Phone: [phone]   Fax:  [phone]

## 2015-04-20 ENCOUNTER — Ambulatory Visit: Payer: Medicare PPO

## 2015-04-20 DIAGNOSIS — M545 Low back pain, unspecified: Secondary | ICD-10-CM

## 2015-04-20 NOTE — Patient Instructions (Signed)
[email or web address] Pt issued low rows, shoulder horiz abduction, shoulder D2 flexion, shoulder D1 extension, double knee to chest, bridges, 2x10 each, written cues for breathing

## 2015-04-20 NOTE — Therapy (Signed)
[place] MAIN [facility] SERVICES [address] [place], [place], [address] Phone: [phone]   Fax:  [phone]  Physical Therapy Treatment and Discharge summary  Patient Details  Name: Travis Schultz MRN: [id number] Date of Birth: August 08, 1926 Referring Provider:  [name], MD  Encounter Date: 04/20/2015      PT End of Session - 04/20/15 1039    Visit Number 11   Number of Visits 17   Date for PT Re-Evaluation 04/27/15   Authorization Type 4/10   PT Start Time 1002   PT Stop Time 1040   PT Time Calculation (min) 38 min   Activity Tolerance Patient tolerated treatment well;No increased pain   Behavior During Therapy [facility] for tasks assessed/performed      Past Medical History  Diagnosis Date  . Myocardial infarction 2012  . Lymphoma 1991    History reviewed. No pertinent past surgical history.  There were no vitals filed for this visit.  Visit Diagnosis:  Bilateral low back pain without sciatica      Subjective Assessment - 04/20/15 1037    Subjective pt reports he no longer has the sharp pains in his back. he only c/o mild pain/stiffness in the AM, which is eased with movement and stretches.    Patient Stated Goals reduce pain,    Currently in Pain? No/denies      therexTherex: Pt assessed ROM: WFL all planes without pain Repeated motion testing was - for reproduction of symptoms pavloff press red band 2x10 each side horiz shoulder abudction 2x10 red band  Min cues for proper diaphragmatic breathing     Standing D1 shoulder extension (Bilateral) green band 2x10 D2 shoulder flexion red band 2x10 Bilateral Lateral trunk rotation with green theraband 2x10, pt required verbal and tactile from rotating hips   Supine: Knees to chest 2x20 sec Bridges x10 Posterior pelvic tilts, pt required verbal and tactile cueing for T/A activation   Pt is progressing well with there ex, requiring less verbal and tactile  cueing.   Outcome measures  Modified Oswestry : 4% disability                                 PT Long Term Goals - 04/20/15 1042    PT LONG TERM GOAL #1   Title pt will demonstrate full lumbar flexion with pain <3/10 order to pick up a golf ball x5.    Time 4   Period Weeks   Status Achieved   PT LONG TERM GOAL #2   Title pt will demo proper lifting mechanics of 20lb item x 5.    Time 4   Period Weeks   Status Achieved   PT LONG TERM GOAL #3   Title pt will swing a golf club x 5 without pain >3/10 to return to PLOF   Time 4   Period Weeks   Status Achieved   PT LONG TERM GOAL #4   Title pt will perfrom a sit up with arms outstretched demonstrating 3/5 abdominal strength significantly increasing core strength.    Baseline 2/5   Time 4   Period Weeks   Status Achieved               Plan - 04/20/15 1040    Clinical Impression Statement pt has achieved PT goals and will be DC to HEP at this time. progressed HEP today with pt demonstrating improved  performance, however did need min cues. most cues were written on the HEP for him, however he was urged to call with any questions or concerns. pt has made good imrpovement regarding his strength and pain. pt is no longer having his primary compliant of sharp L sided pain, and now has mild morning stiffness/pain which is eased by his stretches likely due to OA. Modified oswestry survey showed only 4% disability which is very mild.    Pt will benefit from skilled therapeutic intervention in order to improve on the following deficits Decreased range of motion;Pain;Impaired flexibility;Improper body mechanics;Postural dysfunction;Decreased strength;Decreased mobility;Decreased activity tolerance   Rehab Potential Good   PT Frequency 2x / week   PT Duration 4 weeks   PT Treatment/Interventions Traction;Moist Heat;Therapeutic exercise;Manual techniques;Taping;Therapeutic activities;Electrical Stimulation;Aquatic  Therapy;Ultrasound;Neuromuscular re-education;Passive range of motion;Dry needling;Patient/family education;Gait training;Functional mobility training   PT Next Visit Plan lifting mechanics, HEP           G-Codes - 04-29-15 1043    Functional Assessment Tool Used ROM, pain scale, clinical judement , modifed oswestry    Functional Limitation Changing and maintaining body position   Changing and Maintaining Body Position Current Status (O6712) At least 1 percent but less than 20 percent impaired, limited or restricted   Changing and Maintaining Body Position Goal Status (W5809) At least 1 percent but less than 20 percent impaired, limited or restricted   Changing and Maintaining Body Position Discharge Status (X8338) At least 1 percent but less than 20 percent impaired, limited or restricted      Problem List There are no active problems to display for this patient.  Gorden Harms. Raechel Marcos, PT, DPT [phone]  [name] 29-Apr-2015, 10:44 AM  [place] MAIN [facility] SERVICES [address] [place], [place], [address] Phone: [phone]   Fax:  [phone]

## 2016-01-19 ENCOUNTER — Emergency Department: Payer: Medicare PPO

## 2016-01-19 ENCOUNTER — Emergency Department
Admission: EM | Admit: 2016-01-19 | Discharge: 2016-01-19 | Disposition: A | Discharge status: Home / Self Care | Payer: Medicare PPO | Attending: Emergency Medicine | Admitting: Emergency Medicine

## 2016-01-19 DIAGNOSIS — Z7982 Long term (current) use of aspirin: Secondary | ICD-10-CM | POA: Insufficient documentation

## 2016-01-19 DIAGNOSIS — Y9389 Activity, other specified: Secondary | ICD-10-CM | POA: Diagnosis not present

## 2016-01-19 DIAGNOSIS — Y929 Unspecified place or not applicable: Secondary | ICD-10-CM | POA: Diagnosis not present

## 2016-01-19 DIAGNOSIS — Y999 Unspecified external cause status: Secondary | ICD-10-CM | POA: Insufficient documentation

## 2016-01-19 DIAGNOSIS — Z888 Allergy status to other drugs, medicaments and biological substances status: Secondary | ICD-10-CM | POA: Insufficient documentation

## 2016-01-19 DIAGNOSIS — Z79899 Other long term (current) drug therapy: Secondary | ICD-10-CM | POA: Insufficient documentation

## 2016-01-19 DIAGNOSIS — M79604 Pain in right leg: Secondary | ICD-10-CM | POA: Diagnosis not present

## 2016-01-19 DIAGNOSIS — S76301A Unspecified injury of muscle, fascia and tendon of the posterior muscle group at thigh level, right thigh, initial encounter: Secondary | ICD-10-CM

## 2016-01-19 DIAGNOSIS — M79651 Pain in right thigh: Secondary | ICD-10-CM | POA: Insufficient documentation

## 2016-01-19 DIAGNOSIS — W19XXXA Unspecified fall, initial encounter: Secondary | ICD-10-CM

## 2016-01-19 DIAGNOSIS — I252 Old myocardial infarction: Secondary | ICD-10-CM | POA: Diagnosis not present

## 2016-01-19 DIAGNOSIS — M791 Myalgia: Secondary | ICD-10-CM | POA: Insufficient documentation

## 2016-01-19 MED ORDER — TRAMADOL HCL 50 MG PO TABS
50.0000 mg | ORAL_TABLET | Freq: Four times a day (QID) | ORAL | Status: DC | PRN
Start: 2016-01-19 — End: 2017-03-06

## 2016-01-19 MED ORDER — TRAMADOL HCL 50 MG PO TABS
50.0000 mg | ORAL_TABLET | Freq: Once | ORAL | Status: AC
  Administered 2016-01-19: 50 mg via ORAL
  Filled 2016-01-19: qty 1

## 2016-01-19 NOTE — ED Provider Notes (Signed)
[facility] Emergency Department Provider Note  Time seen: 4:32 PM  I have reviewed the triage vital signs and the nursing notes.   HISTORY  Chief Complaint Fall    HPI Travis Schultz is a 82 y.o. male with a past medical history of MI, lymphoma, presents the emergency department with right leg pain after falling off a lawnmower. According to the patient he was stepping off the lawnmower however it rolled forward approximately 6 inches to 1 foot, his right leg was still in the lawnmower causing him to twist and fall. States pain in the right hamstring, only when he attempts to move or walk. Patient was able to stand, but cannot walk due to pain and states his knee buckles. States very mild dull aching pain in the hamstring at rest, severe pain if he attempts to move the leg.    Past Medical History  Diagnosis Date  . Myocardial infarction ([place]) 2012  . Lymphoma ([place]) 1991    There are no active problems to display for this patient.   History reviewed. No pertinent past surgical history.  Current Outpatient Rx  Name  Route  Sig  Dispense  Refill  . aspirin 81 MG tablet   Oral   Take 81 mg by mouth daily.         . methacholine (PROVOCHOLINE) 100 MG inhaler solution   Inhalation   Inhale into the lungs once.         . multivitamin-iron-minerals-folic acid (CENTRUM) chewable tablet   Oral   Chew 1 tablet by mouth daily.         Marland Kitchen omeprazole (PRILOSEC) 10 MG capsule   Oral   Take 10 mg by mouth daily.           Allergies Phenergan  No family history on file.  Social History Social History  Substance Use Topics  . Smoking status: Never Smoker   . Smokeless tobacco: None  . Alcohol Use: No    Review of Systems Constitutional: Negative for fever. Cardiovascular: Negative for chest pain. Respiratory: Negative for shortness of breath. Gastrointestinal: Negative for abdominal pain Musculoskeletal: Right leg  pain Neurological: Negative for headache 10-point ROS otherwise negative.  ____________________________________________   PHYSICAL EXAM:  VITAL SIGNS: ED Triage Vitals  Enc Vitals Group     BP 01/19/16 1500 137/53 mmHg     Pulse --      Resp 01/19/16 1500 16     Temp 01/19/16 1500 98.3 F (36.8 C)     Temp src --      SpO2 01/19/16 1500 100 %     Weight --      Height --      Head Cir --      Peak Flow --      Pain Score 01/19/16 1457 10     Pain Loc --      Pain Edu? --      Excl. in [place]? --     Constitutional: Alert and oriented. Well appearing and in no distress. Eyes: Normal exam ENT   Head: Normocephalic and atraumatic   Mouth/Throat: Mucous membranes are moist. Cardiovascular: Normal rate, regular rhythm. No murmur Respiratory: Normal respiratory effort without tachypnea nor retractions. Breath sounds are clear Gastrointestinal: Soft and nontender. No distention.   Musculoskeletal: Moderate tenderness to palpation in the right hamstring. No bony tenderness. Good range of motion in all joints without pain. Neurologic:  Normal speech and language. No gross focal neurologic  deficits  Skin:  Skin is warm, dry and intact.  Psychiatric: Mood and affect are normal. Speech and behavior are normal.  ____________________________________________   INITIAL IMPRESSION / ASSESSMENT AND PLAN / ED COURSE  Pertinent labs & imaging results that were available during my care of the patient were reviewed by me and considered in my medical decision making (see chart for details).  Patient presents the emergency department with right side pain. According to the patient he stepped off of his riding lawnmower but the writing or more continued to move approximately 6 inches to 1 foot enough that it twisted his right leg and he fell. Denies hitting his head. Denies LOC. The patient's only pain complaint is of right hamstring pain if he attempts to bear weight. No bony pain. No  pelvis pain. No pain with hip tenderness palpation. Good range of motion in the right hip, right knee, right ankle without pain.  X-ray negative for fracture. Highly suspect muscular injury versus tendon injury. We'll place the patient on crutches, pain medication as needed, and have the patient follow-up with orthopedics as soon as possible.  ____________________________________________   FINAL CLINICAL IMPRESSION(S) / ED DIAGNOSES  Right hamstring pain. Right leg pain Musculoskeletal pain  Harvest Dark, MD 01/19/16 1650

## 2016-01-19 NOTE — ED Notes (Signed)
Patient transported to X-ray 

## 2016-01-19 NOTE — ED Notes (Signed)
Pt from home, states he was mowing the lawn when he fell off the lawnmower, reports R leg pain. Is able to lift leg and pulses present

## 2016-01-19 NOTE — Discharge Instructions (Signed)
You have been seen in the emergency department for right leg pain. As we discussed your workup is most consistent with muscular pain/injury versus tendon injury. Please use your crutches as needed with weightbearing as tolerated. Please call Dr. Sabra Heck for a follow-up appointment as soon as possible. Please use your prescribed pain medication as needed for discomfort. You may also use Tylenol or Motrin. Return to the emergency department for any worsening pain, or any other symptom personally concerning to yourself.   Tendon Injury Tendons are strong, cordlike structures that connect muscle to bone. Tendons are made up of woven fibers, like a rope. A tendon injury is a tear (rupture) of the tendon. The rupture may be partial (only a few of the fibers in your tendon rupture) or complete (your entire tendon ruptures). CAUSES  Tendon injuries can be caused by high-stress activities, such as sports. They also can be caused by a repetitive injury or by a single injury from an excessive, rapid force. SYMPTOMS  Symptoms of tendon injury include pain when you move the joint close to the tendon. Other symptoms are swelling, redness, and warmth. DIAGNOSIS  Tendon injuries often can be diagnosed by physical exam. However, sometimes an X-ray exam or advanced imaging, such as magnetic resonance imaging (MRI), is necessary to determine the extent of the injury. TREATMENT  Partial tendon ruptures often can be treated with immobilization. A splint, bandage, or removable brace usually is used to immobilize the injured tendon. Most injured tendons need to be immobilized for 1-2 months before they are completely healed. Complete tendon ruptures may require surgical reattachment.   This information is not intended to replace advice given to you by your health care provider. Make sure you discuss any questions you have with your health care provider.   Document Released: 10/27/2004 Document Revised: 09/08/2011 Document  Reviewed: 12/11/2011 Elsevier Interactive Patient Education [employer].

## 2016-08-08 ENCOUNTER — Ambulatory Visit: Payer: Self-pay | Attending: Internal Medicine | Admitting: Physical Therapy

## 2016-08-08 DIAGNOSIS — R262 Difficulty in walking, not elsewhere classified: Secondary | ICD-10-CM | POA: Insufficient documentation

## 2016-08-08 NOTE — Therapy (Signed)
[place] MAIN [facility] SERVICES [address] [place], [place], [address] Phone: [phone]   Fax:  [phone]  Patient Details  Name: Travis Schultz MRN: [id number] Date of Birth: [birth year] Referring Provider:  [name], MD  Encounter Date: 08/08/2016  PT/OT/SLP Screening Form   Time: in__4:02p____     Time out_4:34p____   Complaint ____Spinal pain (near coccyx) when walking_______ Past Medical Hx:  ____Spinal surgery at L4-L5, Heart surgery in 1970s_______ Injury Date:____2-3 weeks ago______  Pain Scale: _Now: 2/10, Worst: 8/10_____ Patient's phone number: [phone]  Hx (this occurrence):  Throughout history, the patient is mildly unsure of the injury timeline. The patient notes he fell off his lawn mower 6-8 weeks ago and injured BLE. He states he injured his "hamstrings" while he points to his R quadricep muscle. He went to the ER after his injury, and they referred him to see an orthopedic specialist.  He received 2 shots of cortisone but cannot remember where although he states he thinks it was in the knees. His orthopedist prescribed exercises for him to do. The exercises include LAQ with weights and hip extension with weights. He states he does exercise on the floor every morning, which he thinks his orthopedist gave him but that he has also been doing these exercises long before the pain began. The patient states that since he has been performing his exercises, his hamstrings have improved but his bottom as hurt more. The patient notes pain pointing to his coccyx and lateral aspect each direction near SI joint. He states the pain only occurs when walking and lifting LLE. He cannot give a specific point in his gait that is bothersome. He notes that he is able to exercise with little/no discomfort including walking on a treadmill. He states he has received PT before for low back pain but notes that this pain is very different.    Assessment: The patient gait pattern looks very uncomfortable with a wide base of support and very limited advancement of each lower extremity. His SLR does not reproduce symptoms and the patient demonstrates good flexibility with ability to achieve at least 70 degrees. With piriformis stretching he denies increase in pain or stretching feeling. He notes that he does both these stretches every morning. He is tender to palpation at distal coccyx and lateral to SI joint. He appears to have difficulty relaxing gluteal muscles upon palpation and when laying still in prone. He denies tenderness in lumbar spine and has mild hypomobility in paraspinal muscles. Pelvic compression, SI distraction, and Gaenlyn's were negative. Patient notes no difference in repeated flexion or extension. Repeated forward flexion irritates the pain initially when moving out of the position, but then patient does not feel pain although it's not necessarily relieving. The only pain producing activities were ambulation, lifting LLE x1, and forward flexion x1 with the only consistent pain being during ambulation. Other than pain during ambulation, patient denies any limitation to daily activities noting that sitting, his exercises, and moving BLE was not painful.     Recommendations: Recommended the patient to get a physical therapy evaluation in order to address pain and muscular tightness in gluteal muscles. Instructed to perform activities that he usually performs on the floor on a bed or sleeping bag to relieve pressure in that area.  Comments: Patient noted that since it's improving, he wants to wait a couple weeks to see if the pain begins to dissipate before getting a physical therapy evaluation.    [  x] Patient would benefit from an MD referral [x]  Patient would benefit from a full PT evaluation and treatment. []  No intervention recommended at this time.  Tilman Neat, SPT This entire session was performed under direct  supervision and direction of a licensed therapist/therapist assistant . I have personally read, edited and approve of the note as written.  Trotter,Margaret PT, DPT 08/09/2016, 8:24 AM  [place] MAIN [facility] SERVICES [address] [place], [place], [address] Phone: [phone]   Fax:  [phone]

## 2016-09-01 ENCOUNTER — Ambulatory Visit: Payer: Self-pay | Admitting: Physical Therapy

## 2016-12-26 ENCOUNTER — Other Ambulatory Visit: Payer: Self-pay | Admitting: Surgery

## 2016-12-26 DIAGNOSIS — M47816 Spondylosis without myelopathy or radiculopathy, lumbar region: Secondary | ICD-10-CM

## 2016-12-27 ENCOUNTER — Other Ambulatory Visit: Payer: Self-pay | Admitting: Internal Medicine

## 2016-12-27 DIAGNOSIS — R609 Edema, unspecified: Secondary | ICD-10-CM

## 2016-12-30 ENCOUNTER — Ambulatory Visit
Admission: RE | Admit: 2016-12-30 | Discharge: 2016-12-30 | Disposition: A | Discharge status: Home / Self Care | Payer: Medicare PPO | Source: Ambulatory Visit | Attending: Internal Medicine | Admitting: Internal Medicine

## 2016-12-30 DIAGNOSIS — R609 Edema, unspecified: Secondary | ICD-10-CM

## 2017-01-12 ENCOUNTER — Ambulatory Visit
Admission: RE | Admit: 2017-01-12 | Discharge: 2017-01-12 | Disposition: A | Discharge status: Home / Self Care | Payer: Medicare PPO | Source: Ambulatory Visit | Attending: Surgery | Admitting: Surgery

## 2017-01-12 DIAGNOSIS — M47816 Spondylosis without myelopathy or radiculopathy, lumbar region: Secondary | ICD-10-CM | POA: Insufficient documentation

## 2017-01-12 DIAGNOSIS — M4316 Spondylolisthesis, lumbar region: Secondary | ICD-10-CM | POA: Diagnosis not present

## 2017-01-12 DIAGNOSIS — M48061 Spinal stenosis, lumbar region without neurogenic claudication: Secondary | ICD-10-CM | POA: Insufficient documentation

## 2017-02-28 ENCOUNTER — Encounter (INDEPENDENT_AMBULATORY_CARE_PROVIDER_SITE_OTHER): Payer: Medicare PPO

## 2017-02-28 ENCOUNTER — Other Ambulatory Visit (INDEPENDENT_AMBULATORY_CARE_PROVIDER_SITE_OTHER): Payer: Self-pay | Admitting: Vascular Surgery

## 2017-02-28 ENCOUNTER — Ambulatory Visit (INDEPENDENT_AMBULATORY_CARE_PROVIDER_SITE_OTHER): Payer: Self-pay | Admitting: Vascular Surgery

## 2017-02-28 DIAGNOSIS — I6523 Occlusion and stenosis of bilateral carotid arteries: Secondary | ICD-10-CM

## 2017-03-06 ENCOUNTER — Emergency Department
Admission: EM | Admit: 2017-03-06 | Discharge: 2017-03-06 | Disposition: A | Discharge status: Home / Self Care | Payer: Medicare PPO | Attending: Emergency Medicine | Admitting: Emergency Medicine

## 2017-03-06 DIAGNOSIS — S299XXA Unspecified injury of thorax, initial encounter: Secondary | ICD-10-CM | POA: Diagnosis present

## 2017-03-06 DIAGNOSIS — X58XXXA Exposure to other specified factors, initial encounter: Secondary | ICD-10-CM | POA: Insufficient documentation

## 2017-03-06 DIAGNOSIS — Z7982 Long term (current) use of aspirin: Secondary | ICD-10-CM | POA: Diagnosis not present

## 2017-03-06 DIAGNOSIS — Z8572 Personal history of non-Hodgkin lymphomas: Secondary | ICD-10-CM | POA: Diagnosis not present

## 2017-03-06 DIAGNOSIS — I252 Old myocardial infarction: Secondary | ICD-10-CM | POA: Insufficient documentation

## 2017-03-06 DIAGNOSIS — Y939 Activity, unspecified: Secondary | ICD-10-CM | POA: Diagnosis not present

## 2017-03-06 DIAGNOSIS — Y999 Unspecified external cause status: Secondary | ICD-10-CM | POA: Insufficient documentation

## 2017-03-06 DIAGNOSIS — S29012A Strain of muscle and tendon of back wall of thorax, initial encounter: Secondary | ICD-10-CM | POA: Diagnosis not present

## 2017-03-06 DIAGNOSIS — M549 Dorsalgia, unspecified: Secondary | ICD-10-CM

## 2017-03-06 DIAGNOSIS — Y929 Unspecified place or not applicable: Secondary | ICD-10-CM | POA: Diagnosis not present

## 2017-03-06 DIAGNOSIS — T148XXA Other injury of unspecified body region, initial encounter: Secondary | ICD-10-CM

## 2017-03-06 LAB — COMPREHENSIVE METABOLIC PANEL
ALBUMIN: 4.2 g/dL (ref 3.5–5.0)
ALT: 24 U/L (ref 17–63)
AST: 34 U/L (ref 15–41)
Alkaline Phosphatase: 75 U/L (ref 38–126)
Anion gap: 6 (ref 5–15)
BILIRUBIN TOTAL: 1.5 mg/dL — AB (ref 0.3–1.2)
BUN: 22 mg/dL — AB (ref 6–20)
CO2: 27 mmol/L (ref 22–32)
CREATININE: 0.99 mg/dL (ref 0.61–1.24)
Calcium: 9.3 mg/dL (ref 8.9–10.3)
Chloride: 107 mmol/L (ref 101–111)
GFR calc Af Amer: >60 mL/min (ref >60)
GLUCOSE: 129 mg/dL — AB (ref 65–99)
Potassium: 4.3 mmol/L (ref 3.5–5.1)
Sodium: 140 mmol/L (ref 135–145)
TOTAL PROTEIN: 7.2 g/dL (ref 6.5–8.1)

## 2017-03-06 LAB — CBC WITH DIFFERENTIAL/PLATELET
BASOS ABS: 0.1 10*3/uL (ref 0–0.1)
Basophils Relative: 1 %
EOS ABS: 0.2 10*3/uL (ref 0–0.7)
EOS PCT: 2 %
HCT: 38.3 % — ABNORMAL LOW (ref 40.0–52.0)
Hemoglobin: 13.6 g/dL (ref 13.0–18.0)
LYMPHS ABS: 3.5 10*3/uL (ref 1.0–3.6)
Lymphocytes Relative: 33 %
MCH: 33.1 pg (ref 26.0–34.0)
MCHC: 35.4 g/dL (ref 32.0–36.0)
MCV: 93.3 fL (ref 80.0–100.0)
MONO ABS: 0.5 10*3/uL (ref 0.2–1.0)
Monocytes Relative: 5 %
Neutro Abs: 6.4 10*3/uL (ref 1.4–6.5)
Neutrophils Relative %: 59 %
PLATELETS: 193 10*3/uL (ref 150–440)
RBC: 4.11 MIL/uL — ABNORMAL LOW (ref 4.40–5.90)
RDW: 13.4 % (ref 11.5–14.5)
WBC: 10.6 10*3/uL (ref 3.8–10.6)

## 2017-03-06 LAB — TROPONIN I: Troponin I: <0.03 ng/mL (ref <0.03)

## 2017-03-06 MED ORDER — LIDOCAINE 5 % EX PTCH
1.0000 | MEDICATED_PATCH | CUTANEOUS | Status: DC
  Administered 2017-03-06: 1 via TRANSDERMAL
  Filled 2017-03-06: qty 1

## 2017-03-06 MED ORDER — TRAMADOL HCL 50 MG PO TABS: ORAL_TABLET | ORAL | 0 refills | Status: DC

## 2017-03-06 MED ORDER — TRAMADOL HCL 50 MG PO TABS
100.0000 mg | ORAL_TABLET | Freq: Once | ORAL | Status: AC
  Administered 2017-03-06: 100 mg via ORAL
  Filled 2017-03-06: qty 2

## 2017-03-06 MED ORDER — LIDOCAINE 5 % EX PTCH: 1.0000 | MEDICATED_PATCH | Freq: Two times a day (BID) | CUTANEOUS | 0 refills | Status: AC

## 2017-03-06 NOTE — ED Triage Notes (Signed)
Pt reports upper mid back pain X 3 days, intermittently 10/10. Pt had injection in back last Thursday. [facility] sent patient over. Pt alert and oriented X4, active, cooperative, pt in NAD. RR even and unlabored, color WNL.

## 2017-03-06 NOTE — ED Provider Notes (Signed)
[facility] Emergency Department Provider Note  ____________________________________________   First MD Initiated Contact with Patient 03/06/17 1434     (approximate)  I have reviewed the triage vital signs and the nursing notes.   HISTORY  Chief Complaint Back Pain    HPI Travis Schultz is a 83 y.o. male who is quite healthy and active in spite of his listed medical history.  He presents for evaluation of approximately 5 days of persistent upper back pain.  It is worse on the left between his scapula and spine but also radiates across his entire upper back and is now severe.  He states that there is a constant dull aching pain that occasionally gets worse with sharp stabbing pains.  It does not radiate to his chest.  It is not accompanied with shortness of breath, chest pain, nausea, vomiting, abdominal pain.  He denies any recent fever or chills.  Chronic orthopedic pain in his knees as well as his lower back and he had a lumbar injection by Dr. Sharlet Salina about a week ago but the pain in his upper back started after the injection and is in an unrelated site.  Either he nor his companion have noted any rash in the area that is painful.  It is very tender to palpation and he asks his companion to rub his back and massage the shoulders but then tells her it hurts when she does so.  It does not seem to be particularly sensitive to light touch.  Heating pads used at night make it a little bit better.  He has been taking ibuprofen or Aleve but neither one seems to be making a difference.  He has no history of any recent trauma or injuries, but he did probably tell me that he works out about 45 minutes every day including aerobic exercise and lifting weights and that several times a week he goes to his heart workout regimen.  This included several different constant weightlifting including lat pull downs  He goes to Dr. Roland Rack for orthopedic care and called that clinic but  was told he could not have an appointment for 2 weeks.  He went to [facility] walk-in today but was sent over to the emergency department.   Past Medical History:  Diagnosis Date  . Lymphoma ([place]) 1991  . Myocardial infarction [facility]) 2012    There are no active problems to display for this patient.   History reviewed. No pertinent surgical history.  Prior to Admission medications   Medication Sig Start Date End Date Taking? Authorizing Provider  aspirin 81 MG tablet Take 81 mg by mouth daily.    [provider]  lidocaine (LIDODERM) 5 % Place 1 patch onto the skin every 12 (twelve) hours. Remove & Discard patch within 12 hours or as directed by MD.  Pershing Proud the patch off for 12 hours before applying a new one. 03/06/17 03/06/18  Hinda Kehr, MD  methacholine (PROVOCHOLINE) 100 MG inhaler solution Inhale into the lungs once.    [provider]  multivitamin-iron-minerals-folic acid (CENTRUM) chewable tablet Chew 1 tablet by mouth daily.    [provider]  omeprazole (PRILOSEC) 10 MG capsule Take 10 mg by mouth daily.    [provider]  traMADol Veatrice Bourbon) 50 MG tablet Take 1-2 tablets by mouth every 6 hours as needed for moderate to severe pain 03/06/17   Hinda Kehr, MD    Allergies Phenergan [promethazine hcl]  No family history on file.  Social History Social History  Substance Use Topics  . Smoking status: Never Smoker  . Smokeless tobacco: Not on file  . Alcohol use No    Review of Systems Constitutional: No fever/chills Eyes: No visual changes. ENT: No sore throat. Cardiovascular: Denies chest pain. Respiratory: Denies shortness of breath. Gastrointestinal: No abdominal pain.  No nausea, no vomiting.  No diarrhea.  No constipation. Genitourinary: Negative for dysuria. Musculoskeletal: Severe pain for about 5 days across his upper back, sometimes radiating into his left shoulder and into his neck but mostly going across his upper  back worse slightly on the left Integumentary: Negative for rash. Neurological: Negative for headaches, focal weakness or numbness.   ____________________________________________   PHYSICAL EXAM:  VITAL SIGNS: ED Triage Vitals  Enc Vitals Group     BP 03/06/17 1153 114/63     Pulse Rate 03/06/17 1153 77     Resp 03/06/17 1153 16     Temp 03/06/17 1153 97.5 F (36.4 C)     Temp Source 03/06/17 1153 Oral     SpO2 03/06/17 1153 98 %     Weight 03/06/17 1153 83.9 kg (185 lb)     Height 03/06/17 1153 1.829 m (6')     Head Circumference --      Peak Flow --      Pain Score 03/06/17 1156 10     Pain Loc --      Pain Edu? --      Excl. in [place]? --     Constitutional: Alert and oriented. Well appearing But does appear to be in pain and is holding his shoulder and back Eyes: Conjunctivae are normal.  Head: Atraumatic. Nose: No congestion/rhinnorhea. Mouth/Throat: Mucous membranes are moist. Neck: No stridor.  No meningeal signs.  No cervical spine tenderness to palpation. Cardiovascular: Normal rate, regular rhythm. Good peripheral circulation. Grossly normal heart sounds. Respiratory: Normal respiratory effort.  No retractions. Lungs CTAB. Gastrointestinal: Soft and nontender. No distention.  Musculoskeletal: The patient has easily reproducible musculoskeletal tenderness all along his upper back.  There is no spinal tenderness to palpation of the C-spine nor the T-spine.  There are no vesicular lesions or any evidence of erythema or any kind of infection or rash.  The intensity of the tenderness increases with the intensity of the pressure.  He most easily reproduced the pain when he demonstrated for me the range of motion used for lat pull downs.  No lower extremity tenderness nor edema. No gross deformities of extremities. Neurologic:  Normal speech and language. No gross focal neurologic deficits are appreciated.  Skin:  Skin is warm, dry and intact. No rash noted.  No  allodynia. Psychiatric: Mood and affect are normal. Speech and behavior are normal.  ____________________________________________   LABS (all labs ordered are listed, but only abnormal results are displayed)  Labs Reviewed  COMPREHENSIVE METABOLIC PANEL - Abnormal; Notable for the following:       Result Value   Glucose, Bld 129 (*)    BUN 22 (*)    Total Bilirubin 1.5 (*)    All other components within normal limits  CBC WITH DIFFERENTIAL/PLATELET - Abnormal; Notable for the following:    RBC 4.11 (*)    HCT 38.3 (*)    All other components within normal limits  TROPONIN I   ____________________________________________  EKG  ED ECG REPORT I, Laityn Bensen, the attending physician, personally viewed and interpreted this ECG.  Date: 03/06/2017 EKG Time: 12:02 Rate: 77 Rhythm: sinus  rhythm with first-degree AV block QRS Axis: LAD Intervals: PR interval is 214 ms.  Right bundle branch block is present and has been present since at least 2011 according to old EKG ST/T Wave abnormalities: Non-specific ST segment / T-wave changes, but no evidence of acute ischemia. Conduction Disturbances: none Narrative Interpretation: No evidence of acute ischemia and no significant changes from prior  ____________________________________________  RADIOLOGY   No results found.  ____________________________________________   PROCEDURES  Critical Care performed: No   Procedure(s) performed:   Procedures   ____________________________________________   INITIAL IMPRESSION / ASSESSMENT AND PLAN / ED COURSE  Pertinent labs & imaging results that were available during my care of the patient were reviewed by me and considered in my medical decision making (see chart for details).  The patient has an unchanged EKG from 7 years ago and normal lab work.  He has highly reproducible muscular skeletal pain is upper back and sides reproducing it with palpation, he exacerbated it on ask  him to demonstrate how he does lat pull downs.  I believe that he has caused some muscle strain in his back as a result of his admirable workout routine.  I counseled him about this and encouraged him to back off on the weight he uses but to continue to try to stay mobile to that he does not become stiff and sore.  He will continue to use ibuprofen and I will prescribe him tramadol as well as Lidoderm patches.  We will place a Lidoderm patch on him today in the emergency department to demonstrate how to use it and I explained about using it for 12 hours and then removing it for 12 hours.  I will send Dr. Sharlet Salina a message through [facility] because I think that he may offer some additional management recommendations, possibly including nerve blocks, if the patient's pain is not improved.  The patient understands the plan and commented that he hopes that helps because he has a golf game scheduled for tomorrow.  I encouraged him to only play if he is feeling better and to otherwise take it easy and allow himself to heal.  I also recommended that he only use the heating pad when he does not have a Lidoderm patch in place.  I gave my usual and customary return precautions.         ____________________________________________  FINAL CLINICAL IMPRESSION(S) / ED DIAGNOSES  Final diagnoses:  Upper back pain  Muscle strain     MEDICATIONS GIVEN DURING THIS VISIT:  Medications  traMADol (ULTRAM) tablet 100 mg (100 mg Oral Given 03/06/17 1503)     NEW OUTPATIENT MEDICATIONS STARTED DURING THIS VISIT:  Discharge Medication List as of 03/06/2017  3:34 PM    START taking these medications   Details  lidocaine (LIDODERM) 5 % Place 1 patch onto the skin every 12 (twelve) hours. Remove & Discard patch within 12 hours or as directed by MD.  Pershing Proud the patch off for 12 hours before applying a new one., Starting Mon 03/06/2017, Until Tue 03/06/2018, Print        Discharge Medication List as of 03/06/2017  3:34 PM     CONTINUE these medications which have CHANGED   Details  traMADol (ULTRAM) 50 MG tablet Take 1-2 tablets by mouth every 6 hours as needed for moderate to severe pain, Print        Discharge Medication List as of 03/06/2017  3:34 PM       Note:  This  document was prepared using Systems analyst and may include unintentional dictation errors.    Hinda Kehr, MD 03/06/17 2217

## 2017-03-06 NOTE — ED Notes (Signed)
Electronic signature pad not working at this time. Discharge consent signed and placed in paper chart.

## 2017-03-06 NOTE — ED Notes (Signed)
Spoke with Dr. Kerman Passey regarding patient sx. Orders received.

## 2017-03-06 NOTE — ED Notes (Signed)
Dr Karma Greaser at bedside.

## 2017-03-06 NOTE — ED Notes (Signed)
FN: pt reports upper back pain, constant stabbing pain.

## 2017-03-06 NOTE — Discharge Instructions (Signed)
You have been seen in the Emergency Department (ED)  today for back pain.  Your workup and exam have not shown any acute abnormalities and you are likely suffering from muscle strain or possible problems with your discs, but there is no treatment that will fix your symptoms at this time.  Please take Motrin (ibuprofen) as needed for your pain according to the instructions written on the box.  Alternatively, for the next five days you can take 600mg  three times daily with meals (it may upset your stomach).  Take Tramadol as prescribed for severe pain. Do not drink alcohol, drive or participate in any other potentially dangerous activities while taking this medication as it may make you sleepy. Do not take this medication with any other sedating medications, either prescription or over-the-counter. If you were prescribed Percocet or Vicodin, do not take these with acetaminophen (Tylenol) as it is already contained within these medications.   This medication is an opiate (or narcotic) pain medication and can be habit forming.  Use it as little as possible to achieve adequate pain control.  Do not use or use it with extreme caution if you have a history of opiate abuse or dependence.  If you are on a pain contract with your primary care doctor or a pain specialist, be sure to let them know you were prescribed this medication today from the [facility] Emergency Department.  This medication is intended for your use only - do not give any to anyone else and keep it in a secure place where nobody else, especially children, have access to it.  It will also cause or worsen constipation, so you may want to consider taking an over-the-counter stool softener while you are taking this medication.  Please follow up with your doctor as soon as possible regarding today's ED visit and your back pain.  Return to the ED for worsening back pain, fever, weakness or numbness of either leg, or if you develop either (1) an  inability to urinate or have bowel movements, or (2) loss of your ability to control your bathroom functions (if you start having "accidents"), or if you develop other new symptoms that concern you.

## 2018-04-16 ENCOUNTER — Ambulatory Visit
Payer: Medicare PPO | Attending: Student in an Organized Health Care Education/Training Program | Admitting: Student in an Organized Health Care Education/Training Program

## 2018-04-16 ENCOUNTER — Encounter: Payer: Self-pay | Admitting: Student in an Organized Health Care Education/Training Program

## 2018-04-16 VITALS — BP 122/78 | HR 76 | Temp 98.2°F | Resp 16 | Ht 72.0 in | Wt 186.0 lb

## 2018-04-16 DIAGNOSIS — Z79899 Other long term (current) drug therapy: Secondary | ICD-10-CM | POA: Insufficient documentation

## 2018-04-16 DIAGNOSIS — M5136 Other intervertebral disc degeneration, lumbar region: Secondary | ICD-10-CM | POA: Insufficient documentation

## 2018-04-16 DIAGNOSIS — M51369 Other intervertebral disc degeneration, lumbar region without mention of lumbar back pain or lower extremity pain: Secondary | ICD-10-CM | POA: Insufficient documentation

## 2018-04-16 DIAGNOSIS — G894 Chronic pain syndrome: Secondary | ICD-10-CM | POA: Insufficient documentation

## 2018-04-16 DIAGNOSIS — M47896 Other spondylosis, lumbar region: Secondary | ICD-10-CM | POA: Insufficient documentation

## 2018-04-16 DIAGNOSIS — Z7982 Long term (current) use of aspirin: Secondary | ICD-10-CM | POA: Insufficient documentation

## 2018-04-16 DIAGNOSIS — M1288 Other specific arthropathies, not elsewhere classified, other specified site: Secondary | ICD-10-CM | POA: Diagnosis not present

## 2018-04-16 DIAGNOSIS — M170 Bilateral primary osteoarthritis of knee: Secondary | ICD-10-CM | POA: Diagnosis not present

## 2018-04-16 DIAGNOSIS — M47816 Spondylosis without myelopathy or radiculopathy, lumbar region: Secondary | ICD-10-CM | POA: Diagnosis not present

## 2018-04-16 MED ORDER — DICLOFENAC SODIUM 75 MG PO TBEC: 75.0000 mg | DELAYED_RELEASE_TABLET | Freq: Two times a day (BID) | ORAL | 0 refills | Status: DC

## 2018-04-16 NOTE — Progress Notes (Signed)
Safety precautions to be maintained throughout the outpatient stay will include: orient to surroundings, keep bed in low position, maintain call bell within reach at all times, provide assistance with transfer out of bed and ambulation.  

## 2018-04-16 NOTE — Patient Instructions (Addendum)
Procedure: Lumbar Facet   Facet Blocks Patient Information  Description: The facets are joints in the spine between the vertebrae.  Like any joints in the body, facets can become irritated and painful.  Arthritis can also effect the facets.  By injecting steroids and local anesthetic in and around these joints, we can temporarily block the nerve supply to them.  Steroids act directly on irritated nerves and tissues to reduce selling and inflammation which often leads to decreased pain.  Facet blocks may be done anywhere along the spine from the neck to the low back depending upon the location of your pain.   After numbing the skin with local anesthetic (like Novocaine), a small needle is passed onto the facet joints under x-ray guidance.  You may experience a sensation of pressure while this is being done.  The entire block usually lasts about 15-25 minutes.   Conditions which may be treated by facet blocks:   Low back/buttock pain  Neck/shoulder pain  Certain types of headaches  Preparation for the injection:  1. Do not eat any solid food or dairy products within 8 hours of your appointment. 2. You may drink clear liquid up to 3 hours before appointment.  Clear liquids include water, black coffee, juice or soda.  No milk or cream please. 3. You may take your regular medication, including pain medications, with a sip of water before your appointment.  Diabetics should hold regular insulin (if taken separately) and take 1/2 normal NPH dose the morning of the procedure.  Carry some sugar containing items with you to your appointment. 4. A driver must accompany you and be prepared to drive you home after your procedure. 5. Bring all your current medications with you. 6. An IV may be inserted and sedation may be given at the discretion of the physician. 7. A blood pressure cuff, EKG and other monitors will often be applied during the procedure.  Some patients may need to have extra oxygen  administered for a short period. 8. You will be asked to provide medical information, including your allergies and medications, prior to the procedure.  We must know immediately if you are taking blood thinners (like Coumadin/Warfarin) or if you are allergic to IV iodine contrast (dye).  We must know if you could possible be pregnant.  Possible side-effects:   Bleeding from needle site  Infection (rare, may require surgery)  Nerve injury (rare)  Numbness & tingling (temporary)  Difficulty urinating (rare, temporary)  Spinal headache (a headache worse with upright posture)  Light-headedness (temporary)  Pain at injection site (serveral days)  Decreased blood pressure (rare, temporary)  Weakness in arm/leg (temporary)  Pressure sensation in back/neck (temporary)   Call if you experience:   Fever/chills associated with headache or increased back/neck pain  Headache worsened by an upright position  New onset, weakness or numbness of an extremity below the injection site  Hives or difficulty breathing (go to the emergency room)  Inflammation or drainage at the injection site(s)  Severe back/neck pain greater than usual  New symptoms which are concerning to you  Please note:  Although the local anesthetic injected can often make your back or neck feel good for several hours after the injection, the pain will likely return. It takes 3-7 days for steroids to work.  You may not notice any pain relief for at least one week.  If effective, we will often do a series of 2-3 injections spaced 3-6 weeks apart to maximally decrease your  pain.  After the initial series, you may be a candidate for a more permanent nerve block of the facets.  If you have any questions, please call #[phone]) [facility]   ____________________________________________________________________________________________  Preparing for Procedure with  Sedation  Instructions: . Oral Intake: Do not eat or drink anything for at least 8 hours prior to your procedure. . Transportation: Public transportation is not allowed. Bring an adult driver. The driver must be physically present in our waiting room before any procedure can be started. Marland Kitchen Physical Assistance: Bring an adult physically capable of assisting you, in the event you need help. This adult should keep you company at home for at least 6 hours after the procedure. . Blood Pressure Medicine: Take your blood pressure medicine with a sip of water the morning of the procedure. . Blood thinners:  . Diabetics on insulin: Notify the staff so that you can be scheduled 1st case in the morning. If your diabetes requires high dose insulin, take only  of your normal insulin dose the morning of the procedure and notify the staff that you have done so. . Preventing infections: Shower with an antibacterial soap the morning of your procedure. . Build-up your immune system: Take 1000 mg of Vitamin C with every meal (3 times a day) the day prior to your procedure. Marland Kitchen Antibiotics: Inform the staff if you have a condition or reason that requires you to take antibiotics before dental procedures. . Pregnancy: If you are pregnant, call and cancel the procedure. . Sickness: If you have a cold, fever, or any active infections, call and cancel the procedure. . Arrival: You must be in the facility at least 30 minutes prior to your scheduled procedure. . Children: Do not bring children with you. . Dress appropriately: Bring dark clothing that you would not mind if they get stained. . Valuables: Do not bring any jewelry or valuables.  Procedure appointments are reserved for interventional treatments only. Marland Kitchen No Prescription Refills. . No medication changes will be discussed during procedure appointments. . No disability issues will be discussed.  Remember:  Regular Business hours are:  Monday to Thursday 8:00 AM  to 4:00 PM  Provider's Schedule: Milinda Pointer, MD:  Procedure days: Tuesday and Thursday 7:30 AM to 4:00 PM  Gillis Santa, MD:  Procedure days: Monday and Wednesday 7:30 AM to 4:00 PM ____________________________________________________________________________________________

## 2018-04-16 NOTE — Progress Notes (Signed)
Patient's Name: Travis Schultz  MRN: [id number]  Referring Provider: Corky Mull, MD  DOB: 05-Mar-1926  PCP: [name], MD  DOS: 04/16/2018  Note by: Gillis Santa, MD  Service setting: Ambulatory outpatient  Specialty: Interventional Pain Management  Location: [facility] (AMB) Pain Management Facility  Visit type: Initial Patient Evaluation  Patient type: New Patient   Primary Reason(s) for Visit: Encounter for initial evaluation of one or more chronic problems (new to examiner) potentially causing chronic pain, and posing a threat to normal musculoskeletal function. (Level of risk: High) CC: Back Pain (lower lumbar bilateral ) and Knee Pain (left is worse, injured when he fell off lawnmower when gettin  off)  HPI  Travis Schultz is a 84 y.o. year old, male patient, who comes today to see [place] for the first time for an initial evaluation of his chronic pain. He has Lumbar facet arthropathy; Spondylosis without myelopathy or radiculopathy, lumbar region; Lumbar degenerative disc disease; Bilateral primary osteoarthritis of knee; and Chronic pain syndrome on their problem list. Today he comes in for evaluation of his Back Pain (lower lumbar bilateral ) and Knee Pain (left is worse, injured when he fell off lawnmower when gettin  off)  Pain Assessment: Location: Lower, Left, Right Back Radiating: denies  Onset: More than a month ago Duration: Chronic pain Quality: Discomfort, Radiating Severity: 5 /10 (subjective, self-reported pain score)  Note: Reported level is compatible with observation.                         When using our objective Pain Scale, levels between 6 and 10/10 are said to belong in an emergency room, as it progressively worsens from a 6/10, described as severely limiting, requiring emergency care not usually available at an outpatient pain management facility. At a 6/10 level, communication becomes difficult and requires great effort. Assistance to reach the emergency department  may be required. Facial flushing and profuse sweating along with potentially dangerous increases in heart rate and blood pressure will be evident. Effect on ADL: walking is what seems to aggravate the pain.  he exercises every morning for approx 45 in the floor and he does not have pain at that time.  Timing: Intermittent Modifying factors: aleve bid.  vibrating chair.  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 Severity: Getting worse Timing: Not influenced by the time of the day Aggravating Factors: Walking Alleviating Factors: Lying down and Sitting Associated Problems: Erectile dysfunction and Inability to control bladder (urine) Quality of Pain: Pressure-like Previous Examinations or Tests: CT scan, Epidurogram, MRI scan, Orthopedic evaluation and Chiropractic evaluation Previous Treatments: Chiropractic manipulations, Epidural steroid injections, Physical Therapy and Strengthening exercises  The patient comes into the clinics today for the first time for a chronic pain management evaluation.   84 year old male with a chief complaint of axial low back pain that occasionally radiates into his upper buttock region.  Patient also endorses bilateral knee pain that started after he fell off of his lawnmower.  Patient's back pain is due to degenerative disc disease, degenerative anterolisthesis along with spinal canal stenosis.  Patient also has facet arthropathy.  Patient has been evaluated by neurosurgery who recommended physical therapy and given his age recommended against surgery.  Patient is generally taking Aleve twice daily as well as applying Biofreeze to his lower back which is only helping out to a moderate extent.  He finds greater  pain when he is walking or ambulating and states that his pain gets better when he is resting or sitting.  Of note patient does have an impressive cardiac history with 5 vessel bypass along with carotid  endarterectomy.  Patient does exercise regularly.  Today I took the time to provide the patient with information regarding my pain practice. The patient was informed that my practice is divided into two sections: an interventional pain management section, as well as a completely separate and distinct medication management section. I explained that I have procedure days for my interventional therapies, and evaluation days for follow-ups and medication management. Because of the amount of documentation required during both, they are kept separated. This means that there is the possibility that he may be scheduled for a procedure on one day, and medication management the next. I have also informed him that because of staffing and facility limitations, I no longer take patients for medication management only. To illustrate the reasons for this, I gave the patient the example of surgeons, and how inappropriate it would be to refer a patient to his/her care, just to write for the post-surgical antibiotics on a surgery done by a different surgeon.   Because interventional pain management is my board-certified specialty, the patient was informed that joining my practice means that they are open to any and all interventional therapies. I made it clear that this does not mean that they will be forced to have any procedures done. What this means is that I believe interventional therapies to be essential part of the diagnosis and proper management of chronic pain conditions. Therefore, patients not interested in these interventional alternatives will be better served under the care of a different practitioner.  The patient was also made aware of my Comprehensive Pain Management Safety Guidelines where by joining my practice, they limit all of their nerve blocks and joint injections to those done by our practice, for as long as we are retained to manage their care.   Historic Controlled Substance Pharmacotherapy Review   PMP and historical list of controlled substances: Tramadol 50 mill grams, quantity 30, last fill 03/06/2017.  Not on chronic opioid therapy Medications: The patient did not bring the medication(s) to the appointment, as requested in our "New Patient Package" Pharmacodynamics: Desired effects: Analgesia: The patient reports <50% benefit. Reported improvement in function: The patient reports medication allows him to accomplish basic ADLs. Clinically meaningful improvement in function (CMIF): Sustained CMIF goals met Perceived effectiveness: Described as relatively effective but with some room for improvement Undesirable effects: Side-effects or Adverse reactions: None reported Historical Monitoring: The patient  reports that he does not use drugs. List of all UDS Test(s): No results found for: MDMA, COCAINSCRNUR, PCPSCRNUR, PCPQUANT, CANNABQUANT, THCU, ETH List of other Serum/Urine Drug Screening Test(s):  No results found for: AMPHSCRSER, BARBSCRSER, BENZOSCRSER, COCAINSCRSER, COCAINSCRNUR, PCPSCRSER, PCPQUANT, THCSCRSER, THCU, CANNABQUANT, OPIATESCRSER, OXYSCRSER, PROPOXSCRSER, ETH Historical Background Evaluation: [place] PMP: Six (6) year initial data search conducted.             [place] Department of public safety, offender search: (Public Information) Non-contributory Risk Assessment Profile: Aberrant behavior: None observed or detected today Risk factors for fatal opioid overdose: None identified today Fatal overdose hazard ratio (HR): Calculation deferred Non-fatal overdose hazard ratio (HR): Calculation deferred Risk of opioid abuse or dependence: 0.7-3.0% with doses ? 36 MME/day and 6.1-26% with doses ? 120 MME/day. Substance use disorder (SUD) risk level: Low Opioid risk tool (ORT) (Total Score): 0 Opioid Risk   Tool - 04/16/18 1350      Family History of Substance Abuse   Alcohol  Negative    Illegal Drugs  Negative    Rx Drugs  Negative      Personal History of Substance Abuse    Alcohol  Negative    Illegal Drugs  Negative    Rx Drugs  Negative      Psychological Disease   Psychological Disease  Negative    Depression  Negative      Total Score   Opioid Risk Tool Scoring  0    Opioid Risk Interpretation  Low Risk      ORT Scoring interpretation table:  Score <3 = Low Risk for SUD  Score between 4-7 = Moderate Risk for SUD  Score >8 = High Risk for Opioid Abuse   PHQ-2 Depression Scale:  Total score: 0  PHQ-2 Scoring interpretation table: (Score and probability of major depressive disorder)  Score 0 = No depression  Score 1 = 15.4% Probability  Score 2 = 21.1% Probability  Score 3 = 38.4% Probability  Score 4 = 45.5% Probability  Score 5 = 56.4% Probability  Score 6 = 78.6% Probability   PHQ-9 Depression Scale:  Total score: 0  PHQ-9 Scoring interpretation table:  Score 0-4 = No depression  Score 5-9 = Mild depression  Score 10-14 = Moderate depression  Score 15-19 = Moderately severe depression  Score 20-27 = Severe depression (2.4 times higher risk of SUD and 2.89 times higher risk of overuse)   Pharmacologic Plan: As per protocol, I have not taken over any controlled substance management, pending the results of ordered tests and/or consults.            Initial impression: Pending review of available data and ordered tests.  Meds   Current Outpatient Medications:  .  aspirin 81 MG tablet, Take 81 mg by mouth daily., Disp: , Rfl:  .  docusate sodium (COLACE) 100 MG capsule, Take 1 capsule by mouth as needed., Disp: , Rfl:  .  Multiple Vitamins-Minerals (CENTRUM SILVER 50+MEN PO), Take 1 tablet by mouth daily., Disp: , Rfl:  .  Multiple Vitamins-Minerals (ICAPS AREDS 2) CAPS, Take 1 capsule by mouth 2 (two) times daily., Disp: , Rfl:  .  multivitamin-iron-minerals-folic acid (CENTRUM) chewable tablet, Chew 1 tablet by mouth daily., Disp: , Rfl:  .  omeprazole (PRILOSEC) 10 MG capsule, Take 10 mg by mouth daily., Disp: , Rfl:  .  pravastatin  (PRAVACHOL) 40 MG tablet, Take 40 mg by mouth daily., Disp: , Rfl:  .  diclofenac (VOLTAREN) 75 MG EC tablet, Take 1 tablet (75 mg total) by mouth 2 (two) times daily., Disp: 60 tablet, Rfl: 0 .  methacholine (PROVOCHOLINE) 100 MG inhaler solution, Inhale into the lungs once., Disp: , Rfl:  .  traMADol (ULTRAM) 50 MG tablet, Take 1-2 tablets by mouth every 6 hours as needed for moderate to severe pain (Patient not taking: Reported on 04/16/2018), Disp: 30 tablet, Rfl: 0  Imaging Review  Lumbosacral Imaging: Lumbar MR wo contrast:  Results for orders placed during the hospital encounter of 01/12/17  MR LUMBAR SPINE WO CONTRAST   Narrative CLINICAL DATA:  84 year old male with lumbar back pain after falling off riding lawnmower about 1 year ago. Remote history of lumbar spine surgery in 1971.  EXAM: MRI LUMBAR SPINE WITHOUT CONTRAST  TECHNIQUE: Multiplanar, multisequence MR imaging of the lumbar spine was performed. No intravenous contrast was administered.  COMPARISON:  Lumbar  MRI 01/23/2007. CT Abdomen and Pelvis 01/22/2007  FINDINGS: Segmentation: Appears to be normal on the comparison CT Abdomen and Pelvis. This appears to be the same numbering system used on the 2008 MRI.  Alignment: Stable vertebral height and alignment since 2008. Mild grade 1 anterolisthesis of L4 on L5.  Vertebrae: No marrow edema or evidence of acute osseous abnormality. Visualized bone marrow signal is within normal limits. Visible sacrum and SI joints appear intact.  Conus medullaris: Extends to the L1 level and appears normal.  Paraspinal and other soft tissues: Stable since 2008 and negative.  Disc levels:  T11-T12:  Negative.  T12-L1:  Negative.  L1-L2: Mild disc desiccation and circumferential disc bulge. Mild to moderate facet and ligament flavum hypertrophy with mild progression since 2008. New borderline to mild left lateral recess stenosis. Stable borderline to mild L1 foraminal  stenosis.  L2-L3: Chronic disc desiccation. Progressed circumferential disc bulge and broad-based posterior component of disc. Mild progression of moderate facet and ligament flavum hypertrophy. New moderate to severe spinal stenosis and bilateral lateral recess stenosis. New moderate bilateral L2 foraminal stenosis.  L3-L4: Chronic severe but further progressed disc space loss with circumferential disc osteophyte complex and broad-based posterior component. Chronic moderate to severe facet and ligament flavum hypertrophy with mild progression. Severe spinal and right greater than left lateral recess stenosis appears mildly progressed (series 6, image 16). Mild left and moderate to severe right L3 foraminal stenosis appears stable.  L4-L5: Chronic grade 1 anterolisthesis with severe disc space loss and circumferential disc osteophyte complex with a broad-based posterior component. Chronic moderate ligament flavum and moderate to severe facet hypertrophy. Chronic moderate spinal stenosis and severe right greater than left lateral recess stenosis. Chronic moderate to severe right greater than left L4 foraminal stenosis. This level is stable.  L5-S1: Stable mild circumferential, mostly far lateral disc bulging. Mildly increased mild to moderate facet and ligament flavum hypertrophy. No spinal or lateral recess stenosis. Moderate left greater than right L5 foraminal stenosis is stable.  IMPRESSION: 1. No acute osseous abnormality. Progressed lumbar spine degeneration at L2-L3 and L3-L4 since 2008. New multifactorial moderate spinal and biforaminal stenosis at L2-L3, and progressed severe spinal and right greater than left lateral recess stenosis at L3-L4. Moderate to severe right L3 foraminal stenosis is stable. 2. Stable L4-L5 degeneration since 2008 with underlying chronic grade 1 anterolisthesis. Multifactorial moderate spinal with severe right greater than left lateral recess  stenosis and moderate to severe right greater than left foraminal stenosis is stable. 3. Progressed but mild for age spinal degeneration at L1-L2. Stable L5-S1 level with moderate neural foraminal stenosis.   Electronically Signed   By: Genevie Ann M.D.   On: 01/12/2017 11:01    Complexity Note: Imaging results reviewed. Results shared with Travis Schultz, using Layman's terms.                         ROS  Cardiovascular: Heart trouble, Daily Aspirin intake and Heart attack ( Date: 9/10) Pulmonary or Respiratory: No reported pulmonary signs or symptoms such as wheezing and difficulty taking a deep full breath (Asthma), difficulty blowing air out (Emphysema), coughing up mucus (Bronchitis), persistent dry cough, or temporary stoppage of breathing during sleep Neurological: Incontinence:  Urinary Review of Past Neurological Studies: No results found for this or any previous visit. Psychological-Psychiatric: No reported psychological or psychiatric signs or symptoms such as difficulty sleeping, anxiety, depression, delusions or hallucinations (schizophrenial), mood swings (bipolar disorders) or suicidal  ideations or attempts Gastrointestinal: Irregular, infrequent bowel movements (Constipation) Genitourinary: No reported renal or genitourinary signs or symptoms such as difficulty voiding or producing urine, peeing blood, non-functioning kidney, kidney stones, difficulty emptying the bladder, difficulty controlling the flow of urine, or chronic kidney disease Hematological: No reported hematological signs or symptoms such as prolonged bleeding, low or poor functioning platelets, bruising or bleeding easily, hereditary bleeding problems, low energy levels due to low hemoglobin or being anemic Endocrine: No reported endocrine signs or symptoms such as high or low blood sugar, rapid heart rate due to high thyroid levels, obesity or weight gain due to slow thyroid or thyroid disease Rheumatologic: No  reported rheumatological signs and symptoms such as fatigue, joint pain, tenderness, swelling, redness, heat, stiffness, decreased range of motion, with or without associated rash Musculoskeletal: Negative for myasthenia gravis, muscular dystrophy, multiple sclerosis or malignant hyperthermia Work History: Retired  Allergies  Travis Schultz is allergic to phenergan [promethazine hcl].  Laboratory Chemistry  Inflammation Markers (CRP: Acute Phase) (ESR: Chronic Phase) No results found for: CRP, ESRSEDRATE, LATICACIDVEN                       Rheumatology Markers No results found for: RF, ANA, LABURIC, URICUR, LYMEIGGIGMAB, LYMEABIGMQN, HLAB27                      Renal Function Markers Lab Results  Component Value Date   BUN 22 (H) 03/06/2017   CREATININE 0.99 03/06/2017   GFRAA >60 03/06/2017   GFRNONAA >60 03/06/2017                             Hepatic Function Markers Lab Results  Component Value Date   AST 34 03/06/2017   ALT 24 03/06/2017   ALBUMIN 4.2 03/06/2017   ALKPHOS 75 03/06/2017                        Electrolytes Lab Results  Component Value Date   NA 140 03/06/2017   K 4.3 03/06/2017   CL 107 03/06/2017   CALCIUM 9.3 03/06/2017                        Neuropathy Markers No results found for: VITAMINB12, FOLATE, HGBA1C, HIV                      Bone Pathology Markers No results found for: VD25OH, VD125OH2TOT, [id number], FW2637CH8, 25OHVITD1, 25OHVITD2, 25OHVITD3, TESTOFREE, TESTOSTERONE                       Coagulation Parameters Lab Results  Component Value Date   PLT 193 03/06/2017                        Cardiovascular Markers Lab Results  Component Value Date   TROPONINI <0.03 03/06/2017   HGB 13.6 03/06/2017   HCT 38.3 (L) 03/06/2017                         CA Markers No results found for: CEA, CA125, LABCA2                      Note: Lab results reviewed.  PFSH  Drug: Travis Schultz  reports that he does not use drugs.  Alcohol:   reports that he does not drink alcohol. Tobacco:  reports that he has never smoked. He has never used smokeless tobacco. Medical:  has a past medical history of Lymphoma ([place]) (1991) and Myocardial infarction ([place]) (06/17/2009). Family: family history includes Heart disease in his brother and father.  Past Surgical History:  Procedure Laterality Date  . BACK SURGERY     after injury to back   . CAROTID ARTERY ANGIOPLASTY N/A 02/24/2010  . CATARACT EXTRACTION Bilateral 2012  . EYE SURGERY Bilateral 2012  . ROTATOR CUFF REPAIR Right 2008   Active Ambulatory Problems    Diagnosis Date Noted  . Lumbar facet arthropathy 04/16/2018  . Spondylosis without myelopathy or radiculopathy, lumbar region 04/16/2018  . Lumbar degenerative disc disease 04/16/2018  . Bilateral primary osteoarthritis of knee 04/16/2018  . Chronic pain syndrome 04/16/2018   Resolved Ambulatory Problems    Diagnosis Date Noted  . No Resolved Ambulatory Problems   Past Medical History:  Diagnosis Date  . Lymphoma ([place]) 1991  . Myocardial infarction ([place]) 06/17/2009   Constitutional Exam  General appearance: Well nourished, well developed, and well hydrated. In no apparent acute distress Vitals:   04/16/18 1334  BP: 122/78  Pulse: 76  Resp: 16  Temp: 98.2 F (36.8 C)  TempSrc: Oral  SpO2: 97%  Weight: 186 lb (84.4 kg)  Height: 6' (1.829 m)   BMI Assessment: Estimated body mass index is 25.23 kg/m as calculated from the following:   Height as of this encounter: 6' (1.829 m).   Weight as of this encounter: 186 lb (84.4 kg).  BMI interpretation table: BMI level Category Range association with higher incidence of chronic pain  <18 kg/m2 Underweight   18.5-24.9 kg/m2 Ideal body weight   25-29.9 kg/m2 Overweight Increased incidence by 20%  30-34.9 kg/m2 Obese (Class I) Increased incidence by 68%  35-39.9 kg/m2 Severe obesity (Class II) Increased incidence by 136%  >40 kg/m2 Extreme obesity (Class III)  Increased incidence by 254%   Patient's current BMI Ideal Body weight  Body mass index is 25.23 kg/m. Ideal body weight: 77.6 kg (171 lb 1.2 oz) Adjusted ideal body weight: 80.3 kg (177 lb 0.7 oz)   BMI Readings from Last 4 Encounters:  04/16/18 25.23 kg/m  03/06/17 25.09 kg/m   Wt Readings from Last 4 Encounters:  04/16/18 186 lb (84.4 kg)  03/06/17 185 lb (83.9 kg)  Psych/Mental status: Alert, oriented x 3 (person, place, & time)       Eyes: PERLA Respiratory: No evidence of acute respiratory distress  Cervical Spine Area Exam  Skin & Axial Inspection: No masses, redness, edema, swelling, or associated skin lesions Alignment: Symmetrical Functional ROM: Unrestricted ROM      Stability: No instability detected Muscle Tone/Strength: Functionally intact. No obvious neuro-muscular anomalies detected. Sensory (Neurological): Unimpaired Palpation: No palpable anomalies              Upper Extremity (UE) Exam    Side: Right upper extremity  Side: Left upper extremity  Skin & Extremity Inspection: Skin color, temperature, and hair growth are WNL. No peripheral edema or cyanosis. No masses, redness, swelling, asymmetry, or associated skin lesions. No contractures.  Skin & Extremity Inspection: Skin color, temperature, and hair growth are WNL. No peripheral edema or cyanosis. No masses, redness, swelling, asymmetry, or associated skin lesions. No contractures.  Functional ROM: Unrestricted ROM          Functional ROM: Unrestricted ROM  Muscle Tone/Strength: Functionally intact. No obvious neuro-muscular anomalies detected.  Muscle Tone/Strength: Functionally intact. No obvious neuro-muscular anomalies detected.  Sensory (Neurological): Unimpaired          Sensory (Neurological): Unimpaired          Palpation: No palpable anomalies              Palpation: No palpable anomalies              Provocative Test(s):  Phalen's test: deferred Tinel's test: deferred Apley's scratch test  (touch opposite shoulder):  Action 1 (Across chest): deferred Action 2 (Overhead): deferred Action 3 (LB reach): deferred   Provocative Test(s):  Phalen's test: deferred Tinel's test: deferred Apley's scratch test (touch opposite shoulder):  Action 1 (Across chest): deferred Action 2 (Overhead): deferred Action 3 (LB reach): deferred    Thoracic Spine Area Exam  Skin & Axial Inspection: No masses, redness, or swelling Alignment: Symmetrical Functional ROM: Unrestricted ROM Stability: No instability detected Muscle Tone/Strength: Functionally intact. No obvious neuro-muscular anomalies detected. Sensory (Neurological): Unimpaired Muscle strength & Tone: No palpable anomalies  Lumbar Spine Area Exam  Skin & Axial Inspection: No masses, redness, or swelling Alignment: Symmetrical Functional ROM: Adequate ROM       Stability: No instability detected Muscle Tone/Strength: Functionally intact. No obvious neuro-muscular anomalies detected. Sensory (Neurological): Articular pain pattern pain musculoskeletal Palpation: No palpable anomalies       Provocative Tests: Lumbar Hyperextension/rotation test: (+) bilaterally for facet joint pain. Lumbar quadrant test (Kemp's test): deferred today       Lumbar Lateral bending test: deferred today       Patrick's Maneuver: deferred today                   FABER test: deferred today                   Thigh-thrust test: deferred today       S-I compression test: deferred today       S-I distraction test: deferred today        Gait & Posture Assessment  Ambulation: Unassisted Gait: Relatively normal for age and body habitus Posture: WNL   Lower Extremity Exam    Side: Right lower extremity  Side: Left lower extremity  Stability: No instability observed          Stability: No instability observed          Skin & Extremity Inspection: Skin color, temperature, and hair growth are WNL. No peripheral edema or cyanosis. No masses, redness,  swelling, asymmetry, or associated skin lesions. No contractures.  Skin & Extremity Inspection: Skin color, temperature, and hair growth are WNL. No peripheral edema or cyanosis. No masses, redness, swelling, asymmetry, or associated skin lesions. No contractures.  Functional ROM: Unrestricted ROM                  Functional ROM: Unrestricted ROM                  Muscle Tone/Strength: Functionally intact. No obvious neuro-muscular anomalies detected.  Muscle Tone/Strength: Functionally intact. No obvious neuro-muscular anomalies detected.  Sensory (Neurological): Unimpaired  Sensory (Neurological): Unimpaired  Palpation: No palpable anomalies  Palpation: No palpable anomalies   Assessment  Primary Diagnosis & Pertinent Problem List: The primary encounter diagnosis was Lumbar facet arthropathy. Diagnoses of Spondylosis without myelopathy or radiculopathy, lumbar region, Lumbar degenerative disc disease, Bilateral primary osteoarthritis of knee, and Chronic pain syndrome were also pertinent to   this visit.  Visit Diagnosis (New problems to examiner): 1. Lumbar facet arthropathy   2. Spondylosis without myelopathy or radiculopathy, lumbar region   3. Lumbar degenerative disc disease   4. Bilateral primary osteoarthritis of knee   5. Chronic pain syndrome    General Recommendations: The pain condition that the patient suffers from is best treated with a multidisciplinary approach that involves an increase in physical activity to prevent de-conditioning and worsening of the pain cycle, as well as psychological counseling (formal and/or informal) to address the co-morbid psychological affects of pain. Treatment will often involve judicious use of pain medications and interventional procedures to decrease the pain, allowing the patient to participate in the physical activity that will ultimately produce long-lasting pain reductions. The goal of the multidisciplinary approach is to return the patient to a  higher level of overall function and to restore their ability to perform activities of daily living.  84-year-old male with a history of lumbar degenerative disc disease, spondylosis, facet arthropathy who presents with axial low back pain that occasionally radiates into his superior buttock region.  Patient has tried bilateral S1 transforaminal steroid injections done previously and May 2018 which did not provide the patient with any significant benefit.  Patient does have increased pain when he is walking or upright that is worsened with lumbar extension.  He has been trying physical therapy exercises regularly which have help with his strength in his range of motion but not with his pain.  Given that the patient has lumbar facet arthropathy and lumbar spondylosis, we discussed lumbar facet medial branch nerve blocks at L3, L4, L5.   Travis Schultz has a history of greater than 3 months of moderate to severe pain which is resulted in functional impairment.  The patient has tried various conservative therapeutic options such as NSAIDs, Tylenol, muscle relaxants, physical therapy which was inadequately effective.  Patient's pain is predominantly axial with physical exam findings suggestive of facet arthropathy.  Lumbar facet medial branch nerve blocks were discussed with the patient.  Risks and benefits were reviewed.  Patient would like to proceed with bilateral L3, L4, L5 medial branch nerve block.  In regards to medication management I will have the patient discontinue his Aleve and we will trial diclofenac 75 mg twice daily for 30 days instead.  Risks of NSAIDs discussed with patient including increased risk of stroke as well as adverse cardiovascular events.  Patient aware of risks with NSAID therapy and would like to proceed.  We will plan for 30-day trial of diclofenac.  Patient instructed to  discontinue all other NSAIDs.  Can consider low-dose chronic opioid therapy in the form of hydrocodone  if trial of NSAID and or lumbar facet blocks are not effective.   In regards to the patient's bilateral knee pain, can consider bilateral genicular nerve block and possible genicular nerve radiofrequency ablation   Plan of Care (Initial workup plan)  Note: Please be advised that as per protocol, today's visit has been an evaluation only. We have not taken over the patient's controlled substance management.  Ordered Lab-work, Procedure(s), Referral(s), & Consult(s): Orders Placed This Encounter  Procedures  . LUMBAR FACET(MEDIAL BRANCH NERVE BLOCK) MBNB  . Compliance Drug Analysis, Ur   Pharmacotherapy (current): Medications ordered:  Meds ordered this encounter  Medications  . diclofenac (VOLTAREN) 75 MG EC tablet    Sig: Take 1 tablet (75 mg total) by mouth 2 (two) times daily.    Dispense:  60 tablet      Refill:  0   Medications administered during this visit: Travis Schultz had no medications administered during this visit.   Pharmacological management options:  Opioid Analgesics: The patient was informed that there is no guarantee that he would be a candidate for opioid analgesics. The decision will be made following CDC guidelines. This decision will be based on the results of diagnostic studies, as well as Travis Schultz risk profile.   Membrane stabilizer: To be determined at a later time  Muscle relaxant: To be determined at a later time  NSAID: To be determined at a later time  Other analgesic(s): To be determined at a later time   Interventional management options: Travis Schultz was informed that there is no guarantee that he would be a candidate for interventional therapies. The decision will be based on the results of diagnostic studies, as well as Travis Schultz risk profile.  Procedure(s) under consideration:  Lumbar facet medial branch nerve blocks SI joint injection Genicular nerve block followed by possible genicular nerve radio frequency ablation    Provider-requested follow-up: Return in about 9 days (around 04/25/2018).  No future appointments.  Primary Care Physician: [name], MD Location: [facility] Note by: Gillis Santa, M.D, Date: 04/16/2018; Time: 4:18 PM  Patient Instructions  Procedure: Lumbar Facet   Facet Blocks Patient Information  Description: The facets are joints in the spine between the vertebrae.  Like any joints in the body, facets can become irritated and painful.  Arthritis can also effect the facets.  By injecting steroids and local anesthetic in and around these joints, we can temporarily block the nerve supply to them.  Steroids act directly on irritated nerves and tissues to reduce selling and inflammation which often leads to decreased pain.  Facet blocks may be done anywhere along the spine from the neck to the low back depending upon the location of your pain.   After numbing the skin with local anesthetic (like Novocaine), a small needle is passed onto the facet joints under x-ray guidance.  You may experience a sensation of pressure while this is being done.  The entire block usually lasts about 15-25 minutes.   Conditions which may be treated by facet blocks:   Low back/buttock pain  Neck/shoulder pain  Certain types of headaches  Preparation for the injection:  1. Do not eat any solid food or dairy products within 8 hours of your appointment. 2. You may drink clear liquid up to 3 hours before appointment.  Clear liquids include water, black coffee, juice or soda.  No milk or cream please. 3. You may take your regular medication, including pain medications, with a sip of water before your appointment.  Diabetics should hold regular insulin (if taken separately) and take 1/2 normal NPH dose the morning of the procedure.  Carry some sugar containing items with you to your appointment. 4. A driver must accompany you and be prepared to drive you home after  your procedure. 5. Bring all your current medications with you. 6. An IV may be inserted and sedation may be given at the discretion of the physician. 7. A blood pressure cuff, EKG and other monitors will often be applied during the procedure.  Some patients may need to have extra oxygen administered for a short period. 8. You will be asked to provide medical information, including your allergies and medications, prior to the procedure.  We must know immediately if you are taking blood thinners (like Coumadin/Warfarin) or if  you are allergic to IV iodine contrast (dye).  We must know if you could possible be pregnant.  Possible side-effects:   Bleeding from needle site  Infection (rare, may require surgery)  Nerve injury (rare)  Numbness & tingling (temporary)  Difficulty urinating (rare, temporary)  Spinal headache (a headache worse with upright posture)  Light-headedness (temporary)  Pain at injection site (serveral days)  Decreased blood pressure (rare, temporary)  Weakness in arm/leg (temporary)  Pressure sensation in back/neck (temporary)   Call if you experience:   Fever/chills associated with headache or increased back/neck pain  Headache worsened by an upright position  New onset, weakness or numbness of an extremity below the injection site  Hives or difficulty breathing (go to the emergency room)  Inflammation or drainage at the injection site(s)  Severe back/neck pain greater than usual  New symptoms which are concerning to you  Please note:  Although the local anesthetic injected can often make your back or neck feel good for several hours after the injection, the pain will likely return. It takes 3-7 days for steroids to work.  You may not notice any pain relief for at least one week.  If effective, we will often do a series of 2-3 injections spaced 3-6 weeks apart to maximally decrease your pain.  After the initial series, you may be a candidate for  a more permanent nerve block of the facets.  If you have any questions, please call [phone] [facility]   ____________________________________________________________________________________________  Preparing for Procedure with Sedation  Instructions: . Oral Intake: Do not eat or drink anything for at least 8 hours prior to your procedure. . Transportation: Public transportation is not allowed. Bring an adult driver. The driver must be physically present in our waiting room before any procedure can be started. . Physical Assistance: Bring an adult physically capable of assisting you, in the event you need help. This adult should keep you company at home for at least 6 hours after the procedure. . Blood Pressure Medicine: Take your blood pressure medicine with a sip of water the morning of the procedure. . Blood thinners:  . Diabetics on insulin: Notify the staff so that you can be scheduled 1st case in the morning. If your diabetes requires high dose insulin, take only  of your normal insulin dose the morning of the procedure and notify the staff that you have done so. . Preventing infections: Shower with an antibacterial soap the morning of your procedure. . Build-up your immune system: Take 1000 mg of Vitamin C with every meal (3 times a day) the day prior to your procedure. . Antibiotics: Inform the staff if you have a condition or reason that requires you to take antibiotics before dental procedures. . Pregnancy: If you are pregnant, call and cancel the procedure. . Sickness: If you have a cold, fever, or any active infections, call and cancel the procedure. . Arrival: You must be in the facility at least 30 minutes prior to your scheduled procedure. . Children: Do not bring children with you. . Dress appropriately: Bring dark clothing that you would not mind if they get stained. . Valuables: Do not bring any jewelry or valuables.  Procedure  appointments are reserved for interventional treatments only. . No Prescription Refills. . No medication changes will be discussed during procedure appointments. . No disability issues will be discussed.  Remember:  Regular Business hours are:  Monday to Thursday 8:00 AM to 4:00 PM    Provider's Schedule: Milinda Pointer, MD:  Procedure days: Tuesday and Thursday 7:30 AM to 4:00 PM  Gillis Santa, MD:  Procedure days: Monday and Wednesday 7:30 AM to 4:00 PM ____________________________________________________________________________________________

## 2018-04-20 LAB — COMPLIANCE DRUG ANALYSIS, UR

## 2018-04-25 ENCOUNTER — Ambulatory Visit
Admission: RE | Admit: 2018-04-25 | Discharge: 2018-04-25 | Disposition: A | Discharge status: Home / Self Care | Payer: Medicare PPO | Source: Ambulatory Visit | Attending: Student in an Organized Health Care Education/Training Program | Admitting: Student in an Organized Health Care Education/Training Program

## 2018-04-25 ENCOUNTER — Ambulatory Visit (HOSPITAL_BASED_OUTPATIENT_CLINIC_OR_DEPARTMENT_OTHER): Payer: Medicare PPO | Admitting: Student in an Organized Health Care Education/Training Program

## 2018-04-25 ENCOUNTER — Other Ambulatory Visit: Payer: Self-pay

## 2018-04-25 ENCOUNTER — Encounter: Payer: Self-pay | Admitting: Student in an Organized Health Care Education/Training Program

## 2018-04-25 VITALS — BP 129/67 | HR 73 | Temp 97.7°F | Resp 16 | Ht 72.0 in | Wt 185.0 lb

## 2018-04-25 DIAGNOSIS — M47816 Spondylosis without myelopathy or radiculopathy, lumbar region: Secondary | ICD-10-CM | POA: Insufficient documentation

## 2018-04-25 DIAGNOSIS — Z888 Allergy status to other drugs, medicaments and biological substances status: Secondary | ICD-10-CM | POA: Insufficient documentation

## 2018-04-25 DIAGNOSIS — Z7982 Long term (current) use of aspirin: Secondary | ICD-10-CM | POA: Diagnosis not present

## 2018-04-25 DIAGNOSIS — M1288 Other specific arthropathies, not elsewhere classified, other specified site: Secondary | ICD-10-CM | POA: Diagnosis not present

## 2018-04-25 DIAGNOSIS — Z79899 Other long term (current) drug therapy: Secondary | ICD-10-CM | POA: Insufficient documentation

## 2018-04-25 DIAGNOSIS — M545 Low back pain: Secondary | ICD-10-CM | POA: Diagnosis present

## 2018-04-25 MED ORDER — DEXAMETHASONE SODIUM PHOSPHATE 10 MG/ML IJ SOLN
INTRAMUSCULAR | Status: AC
  Filled 2018-04-25: qty 1

## 2018-04-25 MED ORDER — LIDOCAINE HCL 2 % IJ SOLN
20.0000 mL | Freq: Once | INTRAMUSCULAR | Status: AC
Start: 2018-04-25 — End: 2018-04-25
  Administered 2018-04-25: 400 mg
  Filled 2018-04-25: qty 40

## 2018-04-25 MED ORDER — DEXAMETHASONE SODIUM PHOSPHATE 10 MG/ML IJ SOLN
10.0000 mg | Freq: Once | INTRAMUSCULAR | Status: AC
  Administered 2018-04-25: 10 mg
  Filled 2018-04-25: qty 1

## 2018-04-25 MED ORDER — ROPIVACAINE HCL 2 MG/ML IJ SOLN
10.0000 mL | Freq: Once | INTRAMUSCULAR | Status: AC
  Administered 2018-04-25: 10 mL
  Filled 2018-04-25: qty 10

## 2018-04-25 MED ORDER — FENTANYL CITRATE (PF) 100 MCG/2ML IJ SOLN
25.0000 ug | INTRAMUSCULAR | Status: DC | PRN
  Administered 2018-04-25: 100 ug via INTRAVENOUS
  Filled 2018-04-25: qty 2

## 2018-04-25 MED ORDER — LACTATED RINGERS IV SOLN
1000.0000 mL | Freq: Once | INTRAVENOUS | Status: AC
  Administered 2018-04-25: 1000 mL via INTRAVENOUS

## 2018-04-25 NOTE — Progress Notes (Signed)
Patient's Name: Travis Schultz  MRN: [id number]  Referring Provider: Gillis Santa, MD  DOB: 09-22-1926  PCP: [name], MD  DOS: 04/25/2018  Note by: Gillis Santa, MD  Service setting: Ambulatory outpatient  Specialty: Interventional Pain Management  Patient type: Established  Location: [facility] (AMB) Pain Management Facility  Visit type: Interventional Procedure   Primary Reason for Visit: Interventional Pain Management Treatment. CC: Back Pain (lower)  Procedure:          Anesthesia, Analgesia, Anxiolysis:  Type: Lumbar Facet, Medial Branch Block(s) #1  Primary Purpose: Diagnostic Region: Posterolateral Lumbosacral Spine Level: L3, L4, L5, Medial Branch Level(s). Injecting these levels blocks the L4-5, and L5-S1 lumbar facet joints. Laterality: Bilateral  Type: Moderate (Conscious) Sedation combined with Local Anesthesia Indication(s): Analgesia and Anxiety Route: Intravenous (IV) IV Access: Secured Sedation: Meaningful verbal contact was maintained at all times during the procedure  Local Anesthetic: Lidocaine 2%   Indications: 1. Spondylosis without myelopathy or radiculopathy, lumbar region   2. Lumbar facet arthropathy    Pain Score: Pre-procedure: 5 /10 Post-procedure: 0-No pain/10  Pre-op Assessment:  Travis Schultz is a 84 y.o. (year old), male patient, seen today for interventional treatment. He  has a past surgical history that includes Rotator cuff repair (Right, 2008); Eye surgery (Bilateral, 2012); Carotid angioplasty (N/A, 02/24/2010); Cataract extraction (Bilateral, 2012); and Back surgery. Travis Schultz has a current medication list which includes the following prescription(s): aspirin, diclofenac, docusate sodium, multiple vitamins-minerals, icaps areds 2, omeprazole, pravastatin, methacholine, multivitamin-iron-minerals-folic acid, and tramadol, and the following Facility-Administered Medications: fentanyl. His primarily concern today is the Back Pain  (lower)  Initial Vital Signs:  Pulse/HCG Rate: 65ECG Heart Rate: 69 Temp: 97.7 F (36.5 C) Resp: 16 BP: (!) 143/88 SpO2: 100 %  BMI: Estimated body mass index is 25.09 kg/m as calculated from the following:   Height as of this encounter: 6' (1.829 m).   Weight as of this encounter: 185 lb (83.9 kg).  Risk Assessment: Allergies: Reviewed. He is allergic to phenergan [promethazine hcl].  Allergy Precautions: None required Coagulopathies: Reviewed. None identified.  Blood-thinner therapy: None at this time Active Infection(s): Reviewed. None identified. Travis Schultz is afebrile  Site Confirmation: Travis Schultz was asked to confirm the procedure and laterality before marking the site Procedure checklist: Completed Consent: Before the procedure and under the influence of no sedative(s), amnesic(s), or anxiolytics, the patient was informed of the treatment options, risks and possible complications. To fulfill our ethical and legal obligations, as recommended by the American Medical Association's Code of Ethics, I have informed the patient of my clinical impression; the nature and purpose of the treatment or procedure; the risks, benefits, and possible complications of the intervention; the alternatives, including doing nothing; the risk(s) and benefit(s) of the alternative treatment(s) or procedure(s); and the risk(s) and benefit(s) of doing nothing. The patient was provided information about the general risks and possible complications associated with the procedure. These may include, but are not limited to: failure to achieve desired goals, infection, bleeding, organ or nerve damage, allergic reactions, paralysis, and death. In addition, the patient was informed of those risks and complications associated to Spine-related procedures, such as failure to decrease pain; infection (i.e.: Meningitis, epidural or intraspinal abscess); bleeding (i.e.: epidural hematoma, subarachnoid hemorrhage, or any  other type of intraspinal or peri-dural bleeding); organ or nerve damage (i.e.: Any type of peripheral nerve, nerve root, or spinal cord injury) with subsequent damage to sensory, motor, and/or autonomic systems, resulting in permanent pain,  numbness, and/or weakness of one or several areas of the body; allergic reactions; (i.e.: anaphylactic reaction); and/or death. Furthermore, the patient was informed of those risks and complications associated with the medications. These include, but are not limited to: allergic reactions (i.e.: anaphylactic or anaphylactoid reaction(s)); adrenal axis suppression; blood sugar elevation that in diabetics may result in ketoacidosis or comma; water retention that in patients with history of congestive heart failure may result in shortness of breath, pulmonary edema, and decompensation with resultant heart failure; weight gain; swelling or edema; medication-induced neural toxicity; particulate matter embolism and blood vessel occlusion with resultant organ, and/or nervous system infarction; and/or aseptic necrosis of one or more joints. Finally, the patient was informed that Medicine is not an exact science; therefore, there is also the possibility of unforeseen or unpredictable risks and/or possible complications that may result in a catastrophic outcome. The patient indicated having understood very clearly. We have given the patient no guarantees and we have made no promises. Enough time was given to the patient to ask questions, all of which were answered to the patient's satisfaction. Travis Schultz has indicated that he wanted to continue with the procedure. Attestation: I, the ordering provider, attest that I have discussed with the patient the benefits, risks, side-effects, alternatives, likelihood of achieving goals, and potential problems during recovery for the procedure that I have provided informed consent. Date  Time: 04/25/2018 10:19 AM  Pre-Procedure Preparation:   Monitoring: As per clinic protocol. Respiration, ETCO2, SpO2, BP, heart rate and rhythm monitor placed and checked for adequate function Safety Precautions: Patient was assessed for positional comfort and pressure points before starting the procedure. Time-out: I initiated and conducted the "Time-out" before starting the procedure, as per protocol. The patient was asked to participate by confirming the accuracy of the "Time Out" information. Verification of the correct person, site, and procedure were performed and confirmed by me, the nursing staff, and the patient. "Time-out" conducted as per Joint Commission's Universal Protocol (UP.01.01.01). Time: 1118  Description of Procedure:          Position: Prone Laterality: Bilateral. The procedure was performed in identical fashion on both sides. Levels:  L3, L4, L5, Medial Branch Level(s) Area Prepped: Posterior Lumbosacral Region Prepping solution: ChloraPrep (2% chlorhexidine gluconate and 70% isopropyl alcohol) Safety Precautions: Aspiration looking for blood return was conducted prior to all injections. At no point did we inject any substances, as a needle was being advanced. Before injecting, the patient was told to immediately notify me if he was experiencing any new onset of "ringing in the ears, or metallic taste in the mouth". No attempts were made at seeking any paresthesias. Safe injection practices and needle disposal techniques used. Medications properly checked for expiration dates. SDV (single dose vial) medications used. After the completion of the procedure, all disposable equipment used was discarded in the proper designated medical waste containers. Local Anesthesia: Protocol guidelines were followed. The patient was positioned over the fluoroscopy table. The area was prepped in the usual manner. The time-out was completed. The target area was identified using fluoroscopy. A 12-in long, straight, sterile hemostat was used with  fluoroscopic guidance to locate the targets for each level blocked. Once located, the skin was marked with an approved surgical skin marker. Once all sites were marked, the skin (epidermis, dermis, and hypodermis), as well as deeper tissues (fat, connective tissue and muscle) were infiltrated with a small amount of a short-acting local anesthetic, loaded on a 10cc syringe with a 25G, 1.5-in  Needle. An appropriate amount of time was allowed for local anesthetics to take effect before proceeding to the next step. Local Anesthetic: Lidocaine 2.0% The unused portion of the local anesthetic was discarded in the proper designated containers. Technical explanation of process:   L3 Medial Branch Nerve Block (MBB): The target area for the L3 medial branch is at the junction of the postero-lateral aspect of the superior articular process and the superior, posterior, and medial edge of the transverse process of L4. Under fluoroscopic guidance, a Quincke needle was inserted until contact was made with os over the superior postero-lateral aspect of the pedicular shadow (target area). After negative aspiration for blood, 25mL of the nerve block solution was injected without difficulty or complication. The needle was removed intact. L4 Medial Branch Nerve Block (MBB): The target area for the L4 medial branch is at the junction of the postero-lateral aspect of the superior articular process and the superior, posterior, and medial edge of the transverse process of L5. Under fluoroscopic guidance, a Quincke needle was inserted until contact was made with os over the superior postero-lateral aspect of the pedicular shadow (target area). After negative aspiration for blood,1 mL of the nerve block solution was injected without difficulty or complication. The needle was removed intact. L5 Medial Branch Nerve Block (MBB): The target area for the L5 medial branch is at the junction of the postero-lateral aspect of the superior  articular process and the superior, posterior, and medial edge of the sacral ala. Under fluoroscopic guidance, a Quincke needle was inserted until contact was made with os over the superior postero-lateral aspect of the pedicular shadow (target area). After negative aspiration for blood, 27mL of the nerve block solution was injected without difficulty or complication. The needle was removed intact.  Procedural Needles: 22-gauge, 3.5-inch, Quincke needles used for all levels. Nerve block solution:  10 cc solution containing 9 cc of 0.2% ropivacaine, 1 cc of Decadron 10 mg/cc.  1 cc injected at each level above bilaterally. The unused portion of the solution was discarded in the proper designated containers.  Once the entire procedure was completed, the treated area was cleaned, making sure to leave some of the prepping solution back to take advantage of its long term bactericidal properties.   Illustration of the posterior view of the lumbar spine and the posterior neural structures. Laminae of L2 through S1 are labeled. DPRL5, dorsal primary ramus of L5; DPRS1, dorsal primary ramus of S1; DPR3, dorsal primary ramus of L3; FJ, facet (zygapophyseal) joint L3-L4; I, inferior articular process of L4; LB1, lateral branch of dorsal primary ramus of L1; IAB, inferior articular branches from L3 medial branch (supplies L4-L5 facet joint); IBP, intermediate branch plexus; MB3, medial branch of dorsal primary ramus of L3; NR3, third lumbar nerve root; S, superior articular process of L5; SAB, superior articular branches from L4 (supplies L4-5 facet joint also); TP3, transverse process of L3.  Vitals:   04/25/18 1135 04/25/18 1145 04/25/18 1156 04/25/18 1206  BP: 140/76 127/72 (!) 160/61 129/67  Pulse: 73     Resp: 20 18 18 16   Temp:      TempSrc:      SpO2: 94% 99% 100% 100%  Weight:      Height:        Start Time: 1118 hrs. End Time: 1132 hrs.  Imaging Guidance (Spinal):          Type of Imaging  Technique: Fluoroscopy Guidance (Spinal) Indication(s): Assistance in needle guidance and placement  for procedures requiring needle placement in or near specific anatomical locations not easily accessible without such assistance. Exposure Time: Please see nurses notes. Contrast: None used. Fluoroscopic Guidance: I was personally present during the use of fluoroscopy. "Tunnel Vision Technique" used to obtain the best possible view of the target area. Parallax error corrected before commencing the procedure. "Direction-depth-direction" technique used to introduce the needle under continuous pulsed fluoroscopy. Once target was reached, antero-posterior, oblique, and lateral fluoroscopic projection used confirm needle placement in all planes. Images permanently stored in EMR. Interpretation: No contrast injected. I personally interpreted the imaging intraoperatively. Adequate needle placement confirmed in multiple planes. Permanent images saved into the patient's record.  Antibiotic Prophylaxis:   Anti-infectives (From admission, onward)   None     Indication(s): None identified  Post-operative Assessment:  Post-procedure Vital Signs:  Pulse/HCG Rate: 73(!) 58 Temp: 97.7 F (36.5 C) Resp: 16 BP: 129/67 SpO2: 100 %  EBL: None  Complications: No immediate post-treatment complications observed by team, or reported by patient.  Note: The patient tolerated the entire procedure well. A repeat set of vitals were taken after the procedure and the patient was kept under observation following institutional policy, for this type of procedure. Post-procedural neurological assessment was performed, showing return to baseline, prior to discharge. The patient was provided with post-procedure discharge instructions, including a section on how to identify potential problems. Should any problems arise concerning this procedure, the patient was given instructions to immediately contact us, at any time, without  hesitation. In any case, we plan to contact the patient by telephone for a follow-up status report regarding this interventional procedure.  Comments:  No additional relevant information. 5 out of 5 strength bilateral lower extremity: Plantar flexion, dorsiflexion, knee flexion, knee extension.  Plan of Care   Imaging Orders     DG C-Arm 1-60 Min-No Report Procedure Orders    No procedure(s) ordered today    Medications ordered for procedure: Meds ordered this encounter  Medications  . lactated ringers infusion 1,000 mL  . fentaNYL (SUBLIMAZE) injection 25-100 mcg    Make sure Narcan is available in the pyxis when using this medication. In the event of respiratory depression (RR< 8/min): Titrate NARCAN (naloxone) in increments of 0.1 to 0.2 mg IV at 2-3 minute intervals, until desired degree of reversal.  . lidocaine (XYLOCAINE) 2 % (with pres) injection 400 mg  . ropivacaine (PF) 2 mg/mL (0.2%) (NAROPIN) injection 10 mL  . dexamethasone (DECADRON) injection 10 mg   Medications administered: We administered lactated ringers, fentaNYL, lidocaine, ropivacaine (PF) 2 mg/mL (0.2%), and dexamethasone.  See the medical record for exact dosing, route, and time of administration.  New Prescriptions   No medications on file   Disposition: Discharge home  Discharge Date & Time: 04/25/2018; 1207 hrs.   Physician-requested Follow-up: Return in about 3 weeks (around 05/16/2018) for Post Procedure Evaluation.  Future Appointments  Date Time Provider [place]  05/21/2018  1:15 PM Gillis Santa, MD [facility] None   Primary Care Physician: [name], MD Location: [facility] Note by: Gillis Santa, MD Date: 04/25/2018; Time: 2:48 PM  Disclaimer:  Medicine is not an exact science. The only guarantee in medicine is that nothing is guaranteed. It is important to note that the decision to proceed with this intervention was based on the information  collected from the patient. The Data and conclusions were drawn from the patient's questionnaire, the interview, and the physical examination. Because the information was provided  in large part by the patient, it cannot be guaranteed that it has not been purposely or unconsciously manipulated. Every effort has been made to obtain as much relevant data as possible for this evaluation. It is important to note that the conclusions that lead to this procedure are derived in large part from the available data. Always take into account that the treatment will also be dependent on availability of resources and existing treatment guidelines, considered by other Pain Management Practitioners as being common knowledge and practice, at the time of the intervention. For Medico-Legal purposes, it is also important to point out that variation in procedural techniques and pharmacological choices are the acceptable norm. The indications, contraindications, technique, and results of the above procedure should only be interpreted and judged by a Board-Certified Interventional Pain Specialist with extensive familiarity and expertise in the same exact procedure and technique.

## 2018-04-25 NOTE — Patient Instructions (Signed)

## 2018-04-25 NOTE — Progress Notes (Signed)
Safety precautions to be maintained throughout the outpatient stay will include: orient to surroundings, keep bed in low position, maintain call bell within reach at all times, provide assistance with transfer out of bed and ambulation.  

## 2018-04-26 ENCOUNTER — Telehealth: Payer: Self-pay

## 2018-04-26 NOTE — Telephone Encounter (Signed)
Post procedure phone call. Patient states he is doing well.  

## 2018-05-21 ENCOUNTER — Ambulatory Visit
Payer: Medicare PPO | Attending: Student in an Organized Health Care Education/Training Program | Admitting: Student in an Organized Health Care Education/Training Program

## 2018-05-21 ENCOUNTER — Other Ambulatory Visit: Payer: Self-pay

## 2018-05-21 ENCOUNTER — Encounter: Payer: Self-pay | Admitting: Student in an Organized Health Care Education/Training Program

## 2018-05-21 VITALS — BP 137/60 | HR 66 | Temp 97.7°F | Resp 18 | Ht 72.0 in | Wt 185.0 lb

## 2018-05-21 DIAGNOSIS — I252 Old myocardial infarction: Secondary | ICD-10-CM | POA: Diagnosis not present

## 2018-05-21 DIAGNOSIS — M5136 Other intervertebral disc degeneration, lumbar region: Secondary | ICD-10-CM | POA: Insufficient documentation

## 2018-05-21 DIAGNOSIS — M47816 Spondylosis without myelopathy or radiculopathy, lumbar region: Secondary | ICD-10-CM | POA: Diagnosis present

## 2018-05-21 DIAGNOSIS — Z79899 Other long term (current) drug therapy: Secondary | ICD-10-CM | POA: Diagnosis not present

## 2018-05-21 DIAGNOSIS — Z8572 Personal history of non-Hodgkin lymphomas: Secondary | ICD-10-CM | POA: Diagnosis not present

## 2018-05-21 DIAGNOSIS — M170 Bilateral primary osteoarthritis of knee: Secondary | ICD-10-CM | POA: Insufficient documentation

## 2018-05-21 DIAGNOSIS — G894 Chronic pain syndrome: Secondary | ICD-10-CM | POA: Diagnosis not present

## 2018-05-21 DIAGNOSIS — Z888 Allergy status to other drugs, medicaments and biological substances status: Secondary | ICD-10-CM | POA: Insufficient documentation

## 2018-05-21 DIAGNOSIS — Z7982 Long term (current) use of aspirin: Secondary | ICD-10-CM | POA: Diagnosis not present

## 2018-05-21 NOTE — Progress Notes (Signed)
Patient's Name: Travis Schultz  MRN: [id number]  Referring Provider: [name], MD  DOB: 06/01/1926  PCP: [name], MD  DOS: 05/21/2018  Note by: Gillis Santa, MD  Service setting: Ambulatory outpatient  Specialty: Interventional Pain Management  Location: [facility] (AMB) Pain Management Facility    Patient type: Established   Primary Reason(s) for Visit: Encounter for post-procedure evaluation of chronic illness with mild to moderate exacerbation CC: Follow-up ( Lumbar Facet, Medial Branch Block(s) #1 )  HPI  Travis Schultz is a 84 y.o. year old, male patient, who comes today for a post-procedure evaluation. He has Lumbar facet arthropathy; Spondylosis without myelopathy or radiculopathy, lumbar region; Lumbar degenerative disc disease; Bilateral primary osteoarthritis of knee; and Chronic pain syndrome on their problem list. His primarily concern today is the Follow-up ( Lumbar Facet, Medial Branch Block(s) #1 )  Pain Assessment: Location: Lower, Right, Left Back Radiating: Shooting pain that radiates from left knee to lower back Onset: More than a month ago Duration: Chronic pain Quality: Shooting Severity: 5 /10 (subjective, self-reported pain score)  Note: Reported level is compatible with observation.                         When using our objective Pain Scale, levels between 6 and 10/10 are said to belong in an emergency room, as it progressively worsens from a 6/10, described as severely limiting, requiring emergency care not usually available at an outpatient pain management facility. At a 6/10 level, communication becomes difficult and requires great effort. Assistance to reach the emergency department may be required. Facial flushing and profuse sweating along with potentially dangerous increases in heart rate and blood pressure will be evident. Effect on ADL: "Makes it difficult to walk becasue that is when pain starts"  Timing: Intermittent Modifying factors:  Denies BP: 137/60  HR: 67  Travis Schultz comes in today for post-procedure evaluation after the treatment done on 04/26/2018.  Further details on both, my assessment(s), as well as the proposed treatment plan, please see below.  Post-Procedure Assessment  04/25/2018 Procedure: Bilateral L3, L4, L5 facet medial branch nerve block Pre-procedure pain score:  5/10 Post-procedure pain score: 0/10         Influential Factors: BMI: 25.09 kg/m Intra-procedural challenges: None observed.         Assessment challenges: None detected.              Reported side-effects: None.        Post-procedural adverse reactions or complications: None reported         Sedation: Please see nurses note. When no sedatives are used, the analgesic levels obtained are directly associated to the effectiveness of the local anesthetics. However, when sedation is provided, the level of analgesia obtained during the initial 1 hour following the intervention, is believed to be the result of a combination of factors. These factors may include, but are not limited to: 1. The effectiveness of the local anesthetics used. 2. The effects of the analgesic(s) and/or anxiolytic(s) used. 3. The degree of discomfort experienced by the patient at the time of the procedure. 4. The patients ability and reliability in recalling and recording the events. 5. The presence and influence of possible secondary gains and/or psychosocial factors. Reported result: Relief experienced during the 1st hour after the procedure: 100 % (Ultra-Short Term Relief)            Interpretative annotation: Clinically appropriate result. Analgesia during this  period is likely to be Local Anesthetic and/or IV Sedative (Analgesic/Anxiolytic) related.          Effects of local anesthetic: The analgesic effects attained during this period are directly associated to the localized infiltration of local anesthetics and therefore cary significant diagnostic value as to the  etiological location, or anatomical origin, of the pain. Expected duration of relief is directly dependent on the pharmacodynamics of the local anesthetic used. Long-acting (4-6 hours) anesthetics used.  Reported result: Relief during the next 4 to 6 hour after the procedure: 100 % (Short-Term Relief)            Interpretative annotation: Clinically appropriate result. Analgesia during this period is likely to be Local Anesthetic-related.          Long-term benefit: Defined as the period of time past the expected duration of local anesthetics (1 hour for short-acting and 4-6 hours for long-acting). With the possible exception of prolonged sympathetic blockade from the local anesthetics, benefits during this period are typically attributed to, or associated with, other factors such as analgesic sensory neuropraxia, antiinflammatory effects, or beneficial biochemical changes provided by agents other than the local anesthetics.  Reported result: Extended relief following procedure: 25 % (Long-Term Relief)            Interpretative annotation: Clinically possible results. Good relief. No permanent benefit expected. Inflammation plays a part in the etiology to the pain.          Current benefits: Defined as reported results that persistent at this point in time.   Analgesia: 25-50 %            Function: Somewhat improved ROM: Somewhat improved Interpretative annotation: Recurrence of symptoms. No permanent benefit expected. Effective diagnostic intervention.          Interpretation: Results would suggest a successful diagnostic intervention.                  Plan:  Set up procedure as a PRN palliative treatment option for this patient.                Laboratory Chemistry  Inflammation Markers (CRP: Acute Phase) (ESR: Chronic Phase) No results found for: CRP, ESRSEDRATE, LATICACIDVEN                       Rheumatology Markers No results found for: RF, ANA, LABURIC, URICUR, LYMEIGGIGMAB,  LYMEABIGMQN, HLAB27                      Renal Function Markers Lab Results  Component Value Date   BUN 22 (H) 03/06/2017   CREATININE 0.99 03/06/2017   GFRAA >60 03/06/2017   GFRNONAA >60 03/06/2017                             Hepatic Function Markers Lab Results  Component Value Date   AST 34 03/06/2017   ALT 24 03/06/2017   ALBUMIN 4.2 03/06/2017   ALKPHOS 75 03/06/2017                        Electrolytes Lab Results  Component Value Date   NA 140 03/06/2017   K 4.3 03/06/2017   CL 107 03/06/2017   CALCIUM 9.3 03/06/2017                        Neuropathy Markers  No results found for: VITAMINB12, FOLATE, HGBA1C, HIV                      Bone Pathology Markers No results found for: [place], [id number], [id number], [id number], 25OHVITD1, 25OHVITD2, 25OHVITD3, TESTOFREE, TESTOSTERONE                       Coagulation Parameters Lab Results  Component Value Date   PLT 193 03/06/2017                        Cardiovascular Markers Lab Results  Component Value Date   TROPONINI <0.03 03/06/2017   HGB 13.6 03/06/2017   HCT 38.3 (L) 03/06/2017                         CA Markers No results found for: CEA, CA125, LABCA2                      Note: Lab results reviewed.  Recent Diagnostic Imaging Results  DG C-Arm 1-60 Min-No Report Fluoroscopy was utilized by the requesting physician.  No radiographic  interpretation.   Complexity Note: Imaging results reviewed. Results shared with Mr. Ng, using Layman's terms.                         Meds   Current Outpatient Medications:  .  aspirin 81 MG tablet, Take 81 mg by mouth daily., Disp: , Rfl:  .  diclofenac (VOLTAREN) 75 MG EC tablet, Take 1 tablet (75 mg total) by mouth 2 (two) times daily. (Patient not taking: Reported on 05/21/2018), Disp: 60 tablet, Rfl: 0 .  docusate sodium (COLACE) 100 MG capsule, Take 1 capsule by mouth as needed., Disp: , Rfl:  .  Multiple Vitamins-Minerals (CENTRUM SILVER 50+MEN PO),  Take 1 tablet by mouth daily., Disp: , Rfl:  .  Multiple Vitamins-Minerals (ICAPS AREDS 2) CAPS, Take 1 capsule by mouth 2 (two) times daily., Disp: , Rfl:  .  multivitamin-iron-minerals-folic acid (CENTRUM) chewable tablet, Chew 1 tablet by mouth daily., Disp: , Rfl:  .  omeprazole (PRILOSEC) 10 MG capsule, Take 10 mg by mouth daily., Disp: , Rfl:  .  pravastatin (PRAVACHOL) 40 MG tablet, Take 40 mg by mouth daily., Disp: , Rfl:   ROS  Constitutional: Denies any fever or chills Gastrointestinal: No reported hemesis, hematochezia, vomiting, or acute GI distress Musculoskeletal: Denies any acute onset joint swelling, redness, loss of ROM, or weakness Neurological: No reported episodes of acute onset apraxia, aphasia, dysarthria, agnosia, amnesia, paralysis, loss of coordination, or loss of consciousness  Allergies  Mr. Platner is allergic to phenergan [promethazine hcl].  PFSH  Drug: Mr. Bramhall  reports that he does not use drugs. Alcohol:  reports that he does not drink alcohol. Tobacco:  reports that he has never smoked. He has never used smokeless tobacco. Medical:  has a past medical history of Lymphoma ([place]) (1991) and Myocardial infarction ([place]) (06/17/2009). Surgical: Mr. Carranza  has a past surgical history that includes Rotator cuff repair (Right, 2008); Eye surgery (Bilateral, 2012); Carotid angioplasty (N/A, 02/24/2010); Cataract extraction (Bilateral, 2012); and Back surgery. Family: family history includes Heart disease in his brother and father.  Constitutional Exam  General appearance: Well nourished, well developed, and well hydrated. In no apparent acute distress Vitals:   05/21/18 1334  BP: 137/60  Pulse: 66  Resp:  18  Temp: 97.7 F (36.5 C)  SpO2: 99%  Weight: 185 lb (83.9 kg)  Height: 6' (1.829 m)   BMI Assessment: Estimated body mass index is 25.09 kg/m as calculated from the following:   Height as of this encounter: 6' (1.829 m).   Weight as of this  encounter: 185 lb (83.9 kg).  BMI interpretation table: BMI level Category Range association with higher incidence of chronic pain  <18 kg/m2 Underweight   18.5-24.9 kg/m2 Ideal body weight   25-29.9 kg/m2 Overweight Increased incidence by 20%  30-34.9 kg/m2 Obese (Class I) Increased incidence by 68%  35-39.9 kg/m2 Severe obesity (Class II) Increased incidence by 136%  >40 kg/m2 Extreme obesity (Class III) Increased incidence by 254%   Patient's current BMI Ideal Body weight  Body mass index is 25.09 kg/m. Ideal body weight: 77.6 kg (171 lb 1.2 oz) Adjusted ideal body weight: 80.1 kg (176 lb 10.3 oz)   BMI Readings from Last 4 Encounters:  05/21/18 25.09 kg/m  04/25/18 25.09 kg/m  04/16/18 25.23 kg/m  03/06/17 25.09 kg/m   Wt Readings from Last 4 Encounters:  05/21/18 185 lb (83.9 kg)  04/25/18 185 lb (83.9 kg)  04/16/18 186 lb (84.4 kg)  03/06/17 185 lb (83.9 kg)  Psych/Mental status: Alert, oriented x 3 (person, place, & time)       Eyes: PERLA Respiratory: No evidence of acute respiratory distress  Cervical Spine Area Exam  Skin & Axial Inspection: No masses, redness, edema, swelling, or associated skin lesions Alignment: Symmetrical Functional ROM: Unrestricted ROM      Stability: No instability detected Muscle Tone/Strength: Functionally intact. No obvious neuro-muscular anomalies detected. Sensory (Neurological): Unimpaired Palpation: No palpable anomalies              Upper Extremity (UE) Exam    Side: Right upper extremity  Side: Left upper extremity  Skin & Extremity Inspection: Skin color, temperature, and hair growth are WNL. No peripheral edema or cyanosis. No masses, redness, swelling, asymmetry, or associated skin lesions. No contractures.  Skin & Extremity Inspection: Skin color, temperature, and hair growth are WNL. No peripheral edema or cyanosis. No masses, redness, swelling, asymmetry, or associated skin lesions. No contractures.  Functional ROM:  Unrestricted ROM          Functional ROM: Unrestricted ROM          Muscle Tone/Strength: Functionally intact. No obvious neuro-muscular anomalies detected.  Muscle Tone/Strength: Functionally intact. No obvious neuro-muscular anomalies detected.  Sensory (Neurological): Unimpaired          Sensory (Neurological): Unimpaired          Palpation: No palpable anomalies              Palpation: No palpable anomalies              Provocative Test(s):  Phalen's test: deferred Tinel's test: deferred Apley's scratch test (touch opposite shoulder):  Action 1 (Across chest): deferred Action 2 (Overhead): deferred Action 3 (LB reach): deferred   Provocative Test(s):  Phalen's test: deferred Tinel's test: deferred Apley's scratch test (touch opposite shoulder):  Action 1 (Across chest): deferred Action 2 (Overhead): deferred Action 3 (LB reach): deferred    Thoracic Spine Area Exam  Skin & Axial Inspection: No masses, redness, or swelling Alignment: Symmetrical Functional ROM: Unrestricted ROM Stability: No instability detected Muscle Tone/Strength: Functionally intact. No obvious neuro-muscular anomalies detected. Sensory (Neurological): Unimpaired Muscle strength & Tone: No palpable anomalies  Lumbar Spine Area Exam  Skin &  Axial Inspection: No masses, redness, or swelling Alignment: Symmetrical Functional ROM: Unrestricted ROM       Stability: No instability detected Muscle Tone/Strength: Functionally intact. No obvious neuro-muscular anomalies detected. Sensory (Neurological): Unimpaired Palpation: No palpable anomalies       Provocative Tests: Hyperextension/rotation test: (+) bilaterally for facet joint pain. Lumbar quadrant test (Kemp's test): deferred today       Lateral bending test: deferred today       Patrick's Maneuver: deferred today                   FABER test: deferred today                   S-I anterior distraction/compression test: deferred today         S-I lateral  compression test: deferred today         S-I Thigh-thrust test: deferred today         S-I Gaenslen's test: deferred today          Gait & Posture Assessment  Ambulation: Unassisted Gait: Relatively normal for age and body habitus Posture: WNL   Lower Extremity Exam    Side: Right lower extremity  Side: Left lower extremity  Stability: No instability observed          Stability: No instability observed          Skin & Extremity Inspection: Skin color, temperature, and hair growth are WNL. No peripheral edema or cyanosis. No masses, redness, swelling, asymmetry, or associated skin lesions. No contractures.  Skin & Extremity Inspection: Skin color, temperature, and hair growth are WNL. No peripheral edema or cyanosis. No masses, redness, swelling, asymmetry, or associated skin lesions. No contractures.  Functional ROM: Unrestricted ROM                  Functional ROM: Unrestricted ROM                  Muscle Tone/Strength: Functionally intact. No obvious neuro-muscular anomalies detected.  Muscle Tone/Strength: Functionally intact. No obvious neuro-muscular anomalies detected.  Sensory (Neurological): Unimpaired  Sensory (Neurological): Unimpaired  Palpation: No palpable anomalies  Palpation: No palpable anomalies   Assessment  Primary Diagnosis & Pertinent Problem List: The primary encounter diagnosis was Lumbar facet arthropathy. Diagnoses of Spondylosis without myelopathy or radiculopathy, lumbar region, Lumbar degenerative disc disease, and Chronic pain syndrome were also pertinent to this visit.  Status Diagnosis  Responding Responding Responding 1. Lumbar facet arthropathy   2. Spondylosis without myelopathy or radiculopathy, lumbar region   3. Lumbar degenerative disc disease   4. Chronic pain syndrome      General Recommendations: The pain condition that the patient suffers from is best treated with a multidisciplinary approach that involves an increase in physical activity to  prevent de-conditioning and worsening of the pain cycle, as well as psychological counseling (formal and/or informal) to address the co-morbid psychological affects of pain. Treatment will often involve judicious use of pain medications and interventional procedures to decrease the pain, allowing the patient to participate in the physical activity that will ultimately produce long-lasting pain reductions. The goal of the multidisciplinary approach is to return the patient to a higher level of overall function and to restore their ability to perform activities of daily living.  84 year old male with a history of axial low back pain secondary to lumbar facet arthropathy and lumbar spondylosis as follows up status post bilateral L3, L4, L5 lumbar facet medial branch  nerve block which he states provided him with significant, greater than 50% pain relief for the first week after the procedure.  Patient is currently endorsing approximately 25 to 30% ongoing pain relief after his procedure.  He states that he was able to ambulate for an extended period of time without as much pain.  Patient usually only has pain when he is walking.  He is pain-free when he is sitting down.  We discussed repeating lumbar facet medial branch nerve block #2 at L3, L4, L5 followed by radiofrequency ablation.  Risks and benefits of procedure were discussed.  Patient would like to proceed.  In regards to the patient's bilateral knee pain, can consider bilateral genicular nerve block and possible genicular nerve radiofrequency ablation  Plan: -Repeat lumbar facet medial branch nerve block #2  Lab-work, procedure(s), and/or referral(s): Orders Placed This Encounter  Procedures  . LUMBAR FACET(MEDIAL BRANCH NERVE BLOCK) [facility]    Time Note: Greater than 50% of the 25 minute(s) of face-to-face time spent with Mr. Morrell, was spent in counseling/coordination of care regarding: Mr. Wempe primary cause of pain, the treatment plan,  treatment alternatives, the risks and possible complications of proposed treatment, going over the informed consent, the results, interpretation and significance of  his recent diagnostic interventional treatment(s) and realistic expectations.  Provider-requested follow-up: Return in about 3 weeks (around 06/11/2018) for Procedure.  Future Appointments  Date Time Provider [place]  06/11/2018 11:00 AM Gillis Santa, MD [facility], [place] None    Primary Care Physician: [name], MD Location: [facility] Note by: Gillis Santa, M.D Date: 05/21/2018; Time: 3:40 PM  Patient Instructions  Preparing for Procedure with Sedation Instructions: . Oral Intake: Do not eat or drink anything for at least 8 hours prior to your procedure. . Transportation: Public transportation is not allowed. Bring an adult driver. The driver must be physically present in our waiting room before any procedure can be started. Marland Kitchen Physical Assistance: Bring an adult capable of physically assisting you, in the event you need help. . Blood Pressure Medicine: Take your blood pressure medicine with a sip of water the morning of the procedure. . Insulin: Take only  of your normal insulin dose. . Preventing infections: Shower with an antibacterial soap the morning of your procedure. . Build-up your immune system: Take 1000 mg of Vitamin C with every meal (3 times a day) the day prior to your procedure. . Pregnancy: If you are pregnant, call and cancel the procedure. . Sickness: If you have a cold, fever, or any active infections, call and cancel the procedure. . Arrival: You must be in the facility at least 30 minutes prior to your scheduled procedure. . Children: Do not bring children with you. . Dress appropriately: Bring dark clothing that you would not mind if they get stained. . Valuables: Do not bring any jewelry or valuables. Procedure appointments are reserved for interventional  treatments only. Marland Kitchen No Prescription Refills. . No medication changes will be discussed during procedure appointments. . No disability issues will be discussed.

## 2018-05-21 NOTE — Patient Instructions (Signed)

## 2018-05-21 NOTE — Progress Notes (Signed)
Safety precautions to be maintained throughout the outpatient stay will include: orient to surroundings, keep bed in low position, maintain call bell within reach at all times, provide assistance with transfer out of bed and ambulation.  

## 2018-06-11 ENCOUNTER — Encounter: Payer: Self-pay | Admitting: Student in an Organized Health Care Education/Training Program

## 2018-06-11 ENCOUNTER — Other Ambulatory Visit: Payer: Self-pay

## 2018-06-11 ENCOUNTER — Ambulatory Visit
Admission: RE | Admit: 2018-06-11 | Discharge: 2018-06-11 | Disposition: A | Discharge status: Home / Self Care | Payer: Medicare PPO | Source: Ambulatory Visit | Attending: Student in an Organized Health Care Education/Training Program | Admitting: Student in an Organized Health Care Education/Training Program

## 2018-06-11 ENCOUNTER — Ambulatory Visit (HOSPITAL_BASED_OUTPATIENT_CLINIC_OR_DEPARTMENT_OTHER): Payer: Medicare PPO | Admitting: Student in an Organized Health Care Education/Training Program

## 2018-06-11 VITALS — BP 141/65 | HR 63 | Temp 96.4°F | Resp 12 | Ht 72.0 in | Wt 185.0 lb

## 2018-06-11 DIAGNOSIS — M5136 Other intervertebral disc degeneration, lumbar region: Secondary | ICD-10-CM | POA: Diagnosis not present

## 2018-06-11 DIAGNOSIS — Z7982 Long term (current) use of aspirin: Secondary | ICD-10-CM | POA: Diagnosis not present

## 2018-06-11 DIAGNOSIS — M47816 Spondylosis without myelopathy or radiculopathy, lumbar region: Secondary | ICD-10-CM | POA: Insufficient documentation

## 2018-06-11 DIAGNOSIS — Z888 Allergy status to other drugs, medicaments and biological substances status: Secondary | ICD-10-CM | POA: Insufficient documentation

## 2018-06-11 DIAGNOSIS — Z79899 Other long term (current) drug therapy: Secondary | ICD-10-CM | POA: Insufficient documentation

## 2018-06-11 DIAGNOSIS — M545 Low back pain: Secondary | ICD-10-CM | POA: Diagnosis not present

## 2018-06-11 DIAGNOSIS — G894 Chronic pain syndrome: Secondary | ICD-10-CM

## 2018-06-11 DIAGNOSIS — M1288 Other specific arthropathies, not elsewhere classified, other specified site: Secondary | ICD-10-CM | POA: Insufficient documentation

## 2018-06-11 MED ORDER — LIDOCAINE HCL 2 % IJ SOLN
20.0000 mL | Freq: Once | INTRAMUSCULAR | Status: AC
  Administered 2018-06-11: 400 mg
  Filled 2018-06-11: qty 40

## 2018-06-11 MED ORDER — FENTANYL CITRATE (PF) 100 MCG/2ML IJ SOLN
25.0000 ug | INTRAMUSCULAR | Status: DC | PRN
  Administered 2018-06-11: 50 ug via INTRAVENOUS
  Filled 2018-06-11: qty 2

## 2018-06-11 MED ORDER — LACTATED RINGERS IV SOLN
1000.0000 mL | Freq: Once | INTRAVENOUS | Status: AC
  Administered 2018-06-11: 1000 mL via INTRAVENOUS

## 2018-06-11 MED ORDER — ROPIVACAINE HCL 2 MG/ML IJ SOLN
10.0000 mL | Freq: Once | INTRAMUSCULAR | Status: AC
  Administered 2018-06-11: 10 mL
  Filled 2018-06-11: qty 10

## 2018-06-11 MED ORDER — DEXAMETHASONE SODIUM PHOSPHATE 10 MG/ML IJ SOLN
10.0000 mg | Freq: Once | INTRAMUSCULAR | Status: AC
  Administered 2018-06-11: 10 mg
  Filled 2018-06-11: qty 1

## 2018-06-11 NOTE — Progress Notes (Signed)
Safety precautions to be maintained throughout the outpatient stay will include: orient to surroundings, keep bed in low position, maintain call bell within reach at all times, provide assistance with transfer out of bed and ambulation.  

## 2018-06-11 NOTE — Patient Instructions (Signed)

## 2018-06-11 NOTE — Progress Notes (Signed)
Patient's Name: Travis Schultz  MRN: [id number]  Referring Provider: [name], MD  DOB: 1925-10-14  PCP: [name], MD  DOS: 06/11/2018  Note by: Gillis Santa, MD  Service setting: Ambulatory outpatient  Specialty: Interventional Pain Management  Patient type: Established  Location: [facility] (AMB) Pain Management Facility  Visit type: Interventional Procedure   Primary Reason for Visit: Interventional Pain Management Treatment. CC: Back Pain (lower)  Procedure:          Anesthesia, Analgesia, Anxiolysis:  Type: Lumbar Facet, Medial Branch Block(s) #2  Primary Purpose: Diagnostic Region: Posterolateral Lumbosacral Spine Level: L3, L4, L5, Medial Branch Level(s). Injecting these levels blocks the L4-5, and L5-S1 lumbar facet joints. Laterality: Bilateral  Type: Moderate (Conscious) Sedation combined with Local Anesthesia Indication(s): Analgesia and Anxiety Route: Intravenous (IV) IV Access: Secured Sedation: Meaningful verbal contact was maintained at all times during the procedure  Local Anesthetic: Lidocaine 2%   Indications: 1. Lumbar facet arthropathy   2. Spondylosis without myelopathy or radiculopathy, lumbar region   3. Lumbar degenerative disc disease   4. Chronic pain syndrome    Pain Score: Pre-procedure: 8 /10 Post-procedure: 0-No pain/10  Pre-op Assessment:  Travis Schultz is a 84 y.o. (year old), male patient, seen today for interventional treatment. He  has a past surgical history that includes Rotator cuff repair (Right, 2008); Eye surgery (Bilateral, 2012); Carotid angioplasty (N/A, 02/24/2010); Cataract extraction (Bilateral, 2012); and Back surgery. Travis Schultz has a current medication list which includes the following prescription(s): aspirin, docusate sodium, multiple vitamins-minerals, icaps areds 2, multivitamin-iron-minerals-folic acid, omeprazole, and pravastatin, and the following Facility-Administered Medications: fentanyl. His primarily  concern today is the Back Pain (lower)  Initial Vital Signs:  Pulse/HCG Rate: 66ECG Heart Rate: 63 Temp: 97.8 F (36.6 C) Resp: 16 BP: (!) 155/71 SpO2: 99 %  BMI: Estimated body mass index is 25.09 kg/m as calculated from the following:   Height as of this encounter: 6' (1.829 m).   Weight as of this encounter: 185 lb (83.9 kg).  Risk Assessment: Allergies: Reviewed. He is allergic to phenergan [promethazine hcl].  Allergy Precautions: None required Coagulopathies: Reviewed. None identified.  Blood-thinner therapy: None at this time Active Infection(s): Reviewed. None identified. Travis Schultz is afebrile  Site Confirmation: Travis Schultz was asked to confirm the procedure and laterality before marking the site Procedure checklist: Completed Consent: Before the procedure and under the influence of no sedative(s), amnesic(s), or anxiolytics, the patient was informed of the treatment options, risks and possible complications. To fulfill our ethical and legal obligations, as recommended by the American Medical Association's Code of Ethics, I have informed the patient of my clinical impression; the nature and purpose of the treatment or procedure; the risks, benefits, and possible complications of the intervention; the alternatives, including doing nothing; the risk(s) and benefit(s) of the alternative treatment(s) or procedure(s); and the risk(s) and benefit(s) of doing nothing. The patient was provided information about the general risks and possible complications associated with the procedure. These may include, but are not limited to: failure to achieve desired goals, infection, bleeding, organ or nerve damage, allergic reactions, paralysis, and death. In addition, the patient was informed of those risks and complications associated to Spine-related procedures, such as failure to decrease pain; infection (i.e.: Meningitis, epidural or intraspinal abscess); bleeding (i.e.: epidural hematoma,  subarachnoid hemorrhage, or any other type of intraspinal or peri-dural bleeding); organ or nerve damage (i.e.: Any type of peripheral nerve, nerve root, or spinal cord injury) with subsequent  damage to sensory, motor, and/or autonomic systems, resulting in permanent pain, numbness, and/or weakness of one or several areas of the body; allergic reactions; (i.e.: anaphylactic reaction); and/or death. Furthermore, the patient was informed of those risks and complications associated with the medications. These include, but are not limited to: allergic reactions (i.e.: anaphylactic or anaphylactoid reaction(s)); adrenal axis suppression; blood sugar elevation that in diabetics may result in ketoacidosis or comma; water retention that in patients with history of congestive heart failure may result in shortness of breath, pulmonary edema, and decompensation with resultant heart failure; weight gain; swelling or edema; medication-induced neural toxicity; particulate matter embolism and blood vessel occlusion with resultant organ, and/or nervous system infarction; and/or aseptic necrosis of one or more joints. Finally, the patient was informed that Medicine is not an exact science; therefore, there is also the possibility of unforeseen or unpredictable risks and/or possible complications that may result in a catastrophic outcome. The patient indicated having understood very clearly. We have given the patient no guarantees and we have made no promises. Enough time was given to the patient to ask questions, all of which were answered to the patient's satisfaction. Mr. Schultz has indicated that he wanted to continue with the procedure. Attestation: I, the ordering provider, attest that I have discussed with the patient the benefits, risks, side-effects, alternatives, likelihood of achieving goals, and potential problems during recovery for the procedure that I have provided informed consent. Date  Time: 06/11/2018 10:43  AM  Pre-Procedure Preparation:  Monitoring: As per clinic protocol. Respiration, ETCO2, SpO2, BP, heart rate and rhythm monitor placed and checked for adequate function Safety Precautions: Patient was assessed for positional comfort and pressure points before starting the procedure. Time-out: I initiated and conducted the "Time-out" before starting the procedure, as per protocol. The patient was asked to participate by confirming the accuracy of the "Time Out" information. Verification of the correct person, site, and procedure were performed and confirmed by me, the nursing staff, and the patient. "Time-out" conducted as per Joint Commission's Universal Protocol (UP.01.01.01). Time: 1200  Description of Procedure:          Position: Prone Laterality: Bilateral. The procedure was performed in identical fashion on both sides. Levels:  L3, L4, L5, Medial Branch Level(s) Area Prepped: Posterior Lumbosacral Region Prepping solution: ChloraPrep (2% chlorhexidine gluconate and 70% isopropyl alcohol) Safety Precautions: Aspiration looking for blood return was conducted prior to all injections. At no point did we inject any substances, as a needle was being advanced. Before injecting, the patient was told to immediately notify me if he was experiencing any new onset of "ringing in the ears, or metallic taste in the mouth". No attempts were made at seeking any paresthesias. Safe injection practices and needle disposal techniques used. Medications properly checked for expiration dates. SDV (single dose vial) medications used. After the completion of the procedure, all disposable equipment used was discarded in the proper designated medical waste containers. Local Anesthesia: Protocol guidelines were followed. The patient was positioned over the fluoroscopy table. The area was prepped in the usual manner. The time-out was completed. The target area was identified using fluoroscopy. A 12-in long, straight,  sterile hemostat was used with fluoroscopic guidance to locate the targets for each level blocked. Once located, the skin was marked with an approved surgical skin marker. Once all sites were marked, the skin (epidermis, dermis, and hypodermis), as well as deeper tissues (fat, connective tissue and muscle) were infiltrated with a small amount of a short-acting  local anesthetic, loaded on a 10cc syringe with a 25G, 1.5-in  Needle. An appropriate amount of time was allowed for local anesthetics to take effect before proceeding to the next step. Local Anesthetic: Lidocaine 2.0% The unused portion of the local anesthetic was discarded in the proper designated containers. Technical explanation of process:   L3 Medial Branch Nerve Block (MBB): The target area for the L3 medial branch is at the junction of the postero-lateral aspect of the superior articular process and the superior, posterior, and medial edge of the transverse process of L4. Under fluoroscopic guidance, a Quincke needle was inserted until contact was made with os over the superior postero-lateral aspect of the pedicular shadow (target area). After negative aspiration for blood, 55mL of the nerve block solution was injected without difficulty or complication. The needle was removed intact. L4 Medial Branch Nerve Block (MBB): The target area for the L4 medial branch is at the junction of the postero-lateral aspect of the superior articular process and the superior, posterior, and medial edge of the transverse process of L5. Under fluoroscopic guidance, a Quincke needle was inserted until contact was made with os over the superior postero-lateral aspect of the pedicular shadow (target area). After negative aspiration for blood,1 mL of the nerve block solution was injected without difficulty or complication. The needle was removed intact. L5 Medial Branch Nerve Block (MBB): The target area for the L5 medial branch is at the junction of the  postero-lateral aspect of the superior articular process and the superior, posterior, and medial edge of the sacral ala. Under fluoroscopic guidance, a Quincke needle was inserted until contact was made with os over the superior postero-lateral aspect of the pedicular shadow (target area). After negative aspiration for blood, 35mL of the nerve block solution was injected without difficulty or complication. The needle was removed intact.  Procedural Needles: 22-gauge, 3.5-inch, Quincke needles used for all levels. Nerve block solution:  10 cc solution containing 9 cc of 0.2% ropivacaine, 1 cc of Decadron 10 mg/cc.  1 cc injected at each level above bilaterally. The unused portion of the solution was discarded in the proper designated containers.  Once the entire procedure was completed, the treated area was cleaned, making sure to leave some of the prepping solution back to take advantage of its long term bactericidal properties.   Illustration of the posterior view of the lumbar spine and the posterior neural structures. Laminae of L2 through S1 are labeled. DPRL5, dorsal primary ramus of L5; DPRS1, dorsal primary ramus of S1; DPR3, dorsal primary ramus of L3; FJ, facet (zygapophyseal) joint L3-L4; I, inferior articular process of L4; LB1, lateral branch of dorsal primary ramus of L1; IAB, inferior articular branches from L3 medial branch (supplies L4-L5 facet joint); IBP, intermediate branch plexus; MB3, medial branch of dorsal primary ramus of L3; NR3, third lumbar nerve root; S, superior articular process of L5; SAB, superior articular branches from L4 (supplies L4-5 facet joint also); TP3, transverse process of L3.  Vitals:   06/11/18 1218 06/11/18 1228 06/11/18 1238 06/11/18 1249  BP: (!) 147/69 (!) 151/69 130/63 (!) 141/65  Pulse:  60 (!) 58 63  Resp: 11 10 11 12   Temp:  (!) 96.4 F (35.8 C)    TempSrc:  Temporal    SpO2: 95% 95% 95% 96%  Weight:      Height:        Start Time: 1200  hrs. End Time: 1216 hrs.  Imaging Guidance (Spinal):  Type of Imaging Technique: Fluoroscopy Guidance (Spinal) Indication(s): Assistance in needle guidance and placement for procedures requiring needle placement in or near specific anatomical locations not easily accessible without such assistance. Exposure Time: Please see nurses notes. Contrast: None used. Fluoroscopic Guidance: I was personally present during the use of fluoroscopy. "Tunnel Vision Technique" used to obtain the best possible view of the target area. Parallax error corrected before commencing the procedure. "Direction-depth-direction" technique used to introduce the needle under continuous pulsed fluoroscopy. Once target was reached, antero-posterior, oblique, and lateral fluoroscopic projection used confirm needle placement in all planes. Images permanently stored in EMR. Interpretation: No contrast injected. I personally interpreted the imaging intraoperatively. Adequate needle placement confirmed in multiple planes. Permanent images saved into the patient's record.  Antibiotic Prophylaxis:   Anti-infectives (From admission, onward)   None     Indication(s): None identified  Post-operative Assessment:  Post-procedure Vital Signs:  Pulse/HCG Rate: 6364 Temp: (!) 96.4 F (35.8 C) Resp: 12 BP: (!) 141/65 SpO2: 96 %  EBL: None  Complications: No immediate post-treatment complications observed by team, or reported by patient.  Note: The patient tolerated the entire procedure well. A repeat set of vitals were taken after the procedure and the patient was kept under observation following institutional policy, for this type of procedure. Post-procedural neurological assessment was performed, showing return to baseline, prior to discharge. The patient was provided with post-procedure discharge instructions, including a section on how to identify potential problems. Should any problems arise concerning this  procedure, the patient was given instructions to immediately contact us, at any time, without hesitation. In any case, we plan to contact the patient by telephone for a follow-up status report regarding this interventional procedure.  Comments:  No additional relevant information. 5 out of 5 strength bilateral lower extremity: Plantar flexion, dorsiflexion, knee flexion, knee extension.  Plan of Care   Imaging Orders     DG C-Arm 1-60 Min-No Report Procedure Orders    No procedure(s) ordered today    Medications ordered for procedure: Meds ordered this encounter  Medications  . lactated ringers infusion 1,000 mL  . fentaNYL (SUBLIMAZE) injection 25-100 mcg    Make sure Narcan is available in the pyxis when using this medication. In the event of respiratory depression (RR< 8/min): Titrate NARCAN (naloxone) in increments of 0.1 to 0.2 mg IV at 2-3 minute intervals, until desired degree of reversal.  . ropivacaine (PF) 2 mg/mL (0.2%) (NAROPIN) injection 10 mL  . lidocaine (XYLOCAINE) 2 % (with pres) injection 400 mg  . dexamethasone (DECADRON) injection 10 mg   Medications administered: We administered lactated ringers, fentaNYL, ropivacaine (PF) 2 mg/mL (0.2%), lidocaine, and dexamethasone.  See the medical record for exact dosing, route, and time of administration.  New Prescriptions   No medications on file   Disposition: Discharge home  Discharge Date & Time: 06/11/2018; 1255 hrs.   Physician-requested Follow-up: Return in about 4 weeks (around 07/09/2018) for Post Procedure Evaluation.  Future Appointments  Date Time Provider [place]  07/10/2018 11:45 AM Gillis Santa, MD [facility] None   Primary Care Physician: [name], MD Location: [facility] Note by: Gillis Santa, MD Date: 06/11/2018; Time: 2:40 PM  Disclaimer:  Medicine is not an exact science. The only guarantee in medicine is that nothing is guaranteed. It is  important to note that the decision to proceed with this intervention was based on the information collected from the patient. The Data and conclusions were drawn from  the patient's questionnaire, the interview, and the physical examination. Because the information was provided in large part by the patient, it cannot be guaranteed that it has not been purposely or unconsciously manipulated. Every effort has been made to obtain as much relevant data as possible for this evaluation. It is important to note that the conclusions that lead to this procedure are derived in large part from the available data. Always take into account that the treatment will also be dependent on availability of resources and existing treatment guidelines, considered by other Pain Management Practitioners as being common knowledge and practice, at the time of the intervention. For Medico-Legal purposes, it is also important to point out that variation in procedural techniques and pharmacological choices are the acceptable norm. The indications, contraindications, technique, and results of the above procedure should only be interpreted and judged by a Board-Certified Interventional Pain Specialist with extensive familiarity and expertise in the same exact procedure and technique.

## 2018-06-12 ENCOUNTER — Telehealth: Payer: Self-pay

## 2018-06-12 NOTE — Telephone Encounter (Signed)
Denies any needs at time. Instructed that he is feeling better today. Instructed to call if needed.

## 2018-07-10 ENCOUNTER — Ambulatory Visit
Payer: Medicare PPO | Attending: Student in an Organized Health Care Education/Training Program | Admitting: Student in an Organized Health Care Education/Training Program

## 2018-07-10 ENCOUNTER — Other Ambulatory Visit: Payer: Self-pay

## 2018-07-10 ENCOUNTER — Encounter: Payer: Self-pay | Admitting: Student in an Organized Health Care Education/Training Program

## 2018-07-10 VITALS — BP 126/71 | HR 65 | Temp 98.5°F | Ht 72.0 in | Wt 190.0 lb

## 2018-07-10 DIAGNOSIS — Z79899 Other long term (current) drug therapy: Secondary | ICD-10-CM | POA: Insufficient documentation

## 2018-07-10 DIAGNOSIS — I252 Old myocardial infarction: Secondary | ICD-10-CM | POA: Insufficient documentation

## 2018-07-10 DIAGNOSIS — Z8579 Personal history of other malignant neoplasms of lymphoid, hematopoietic and related tissues: Secondary | ICD-10-CM | POA: Insufficient documentation

## 2018-07-10 DIAGNOSIS — Z7982 Long term (current) use of aspirin: Secondary | ICD-10-CM | POA: Insufficient documentation

## 2018-07-10 DIAGNOSIS — M47816 Spondylosis without myelopathy or radiculopathy, lumbar region: Secondary | ICD-10-CM | POA: Diagnosis not present

## 2018-07-10 DIAGNOSIS — G894 Chronic pain syndrome: Secondary | ICD-10-CM | POA: Diagnosis not present

## 2018-07-10 DIAGNOSIS — M5136 Other intervertebral disc degeneration, lumbar region: Secondary | ICD-10-CM | POA: Insufficient documentation

## 2018-07-10 DIAGNOSIS — Z8249 Family history of ischemic heart disease and other diseases of the circulatory system: Secondary | ICD-10-CM | POA: Insufficient documentation

## 2018-07-10 DIAGNOSIS — M170 Bilateral primary osteoarthritis of knee: Secondary | ICD-10-CM | POA: Insufficient documentation

## 2018-07-10 NOTE — Patient Instructions (Signed)
____________________________________________________________________________________________  Preparing for Procedure with Sedation  Instructions: . Oral Intake: Do not eat or drink anything for at least 8 hours prior to your procedure. . Transportation: Public transportation is not allowed. Bring an adult driver. The driver must be physically present in our waiting room before any procedure can be started. . Physical Assistance: Bring an adult physically capable of assisting you, in the event you need help. This adult should keep you company at home for at least 6 hours after the procedure. . Blood Pressure Medicine: Take your blood pressure medicine with a sip of water the morning of the procedure. . Blood thinners: Notify our staff if you are taking any blood thinners. Depending on which one you take, there will be specific instructions on how and when to stop it. . Diabetics on insulin: Notify the staff so that you can be scheduled 1st case in the morning. If your diabetes requires high dose insulin, take only  of your normal insulin dose the morning of the procedure and notify the staff that you have done so. . Preventing infections: Shower with an antibacterial soap the morning of your procedure. . Build-up your immune system: Take 1000 mg of Vitamin C with every meal (3 times a day) the day prior to your procedure. . Antibiotics: Inform the staff if you have a condition or reason that requires you to take antibiotics before dental procedures. . Pregnancy: If you are pregnant, call and cancel the procedure. . Sickness: If you have a cold, fever, or any active infections, call and cancel the procedure. . Arrival: You must be in the facility at least 30 minutes prior to your scheduled procedure. . Children: Do not bring children with you. . Dress appropriately: Bring dark clothing that you would not mind if they get stained. . Valuables: Do not bring any jewelry or valuables.  Procedure  appointments are reserved for interventional treatments only. . No Prescription Refills. . No medication changes will be discussed during procedure appointments. . No disability issues will be discussed.  Reasons to call and reschedule or cancel your procedure: (Following these recommendations will minimize the risk of a serious complication.) . Surgeries: Avoid having procedures within 2 weeks of any surgery. (Avoid for 2 weeks before or after any surgery). . Flu Shots: Avoid having procedures within 2 weeks of a flu shots or . (Avoid for 2 weeks before or after immunizations). . Barium: Avoid having a procedure within 7-10 days after having had a radiological study involving the use of radiological contrast. (Myelograms, Barium swallow or enema study). . Heart attacks: Avoid any elective procedures or surgeries for the initial 6 months after a "Myocardial Infarction" (Heart Attack). . Blood thinners: It is imperative that you stop these medications before procedures. Let us know if you if you take any blood thinner.  . Infection: Avoid procedures during or within two weeks of an infection (including chest colds or gastrointestinal problems). Symptoms associated with infections include: Localized redness, fever, chills, night sweats or profuse sweating, burning sensation when voiding, cough, congestion, stuffiness, runny nose, sore throat, diarrhea, nausea, vomiting, cold or Flu symptoms, recent or current infections. It is specially important if the infection is over the area that we intend to treat. . Heart and lung problems: Symptoms that may suggest an active cardiopulmonary problem include: cough, chest pain, breathing difficulties or shortness of breath, dizziness, ankle swelling, uncontrolled high or unusually low blood pressure, and/or palpitations. If you are experiencing any of these symptoms, cancel   your procedure and contact your primary care physician for an evaluation.  Remember:   Regular Business hours are:  Monday to Thursday 8:00 AM to 4:00 PM  Provider's Schedule: Francisco Naveira, MD:  Procedure days: Tuesday and Thursday 7:30 AM to 4:00 PM  Bilal Lateef, MD:  Procedure days: Monday and Wednesday 7:30 AM to 4:00 PM ____________________________________________________________________________________________  Radiofrequency Lesioning Radiofrequency lesioning is a procedure that is performed to relieve pain. The procedure is often used for back, neck, or arm pain. Radiofrequency lesioning involves the use of a machine that creates radio waves to make heat. During the procedure, the heat is applied to the nerve that carries the pain signal. The heat damages the nerve and interferes with the pain signal. Pain relief usually starts about 2 weeks after the procedure and lasts for 6 months to 1 year. Tell a health care provider about:  Any allergies you have.  All medicines you are taking, including vitamins, herbs, eye drops, creams, and over-the-counter medicines.  Any problems you or family members have had with anesthetic medicines.  Any blood disorders you have.  Any surgeries you have had.  Any medical conditions you have.  Whether you are pregnant or may be pregnant. What are the risks? Generally, this is a safe procedure. However, problems may occur, including:  Pain or soreness at the injection site.  Infection at the injection site.  Damage to nerves or blood vessels.  What happens before the procedure?  Ask your health care provider about: ? Changing or stopping your regular medicines. This is especially important if you are taking diabetes medicines or blood thinners. ? Taking medicines such as aspirin and ibuprofen. These medicines can thin your blood. Do not take these medicines before your procedure if your health care provider instructs you not to.  Follow instructions from your health care provider about eating or drinking  restrictions.  Plan to have someone take you home after the procedure.  If you go home right after the procedure, plan to have someone with you for 24 hours. What happens during the procedure?  You will be given one or more of the following: ? A medicine to help you relax (sedative). ? A medicine to numb the area (local anesthetic).  You will be awake during the procedure. You will need to be able to talk with the health care provider during the procedure.  With the help of a type of X-ray (fluoroscopy), the health care provider will insert a radiofrequency needle into the area to be treated.  Next, a wire that carries the radio waves (electrode) will be put through the radiofrequency needle. An electrical pulse will be sent through the electrode to verify the correct nerve. You will feel a tingling sensation, and you may have muscle twitching.  Then, the tissue that is around the needle tip will be heated by an electric current that is passed using the radiofrequency machine. This will numb the nerves.  A bandage (dressing) will be put on the insertion area after the procedure is done. The procedure may vary among health care providers and hospitals. What happens after the procedure?  Your blood pressure, heart rate, breathing rate, and blood oxygen level will be monitored often until the medicines you were given have worn off.  Return to your normal activities as directed by your health care provider. This information is not intended to replace advice given to you by your health care provider. Make sure you discuss any questions you have   with your health care provider. Document Released: 05/18/2011 Document Revised: 02/25/2016 Document Reviewed: 10/27/2014 [employer] Interactive Patient Education  2018 [employer]  

## 2018-07-10 NOTE — Progress Notes (Signed)
Patient's Name: Travis Schultz  MRN: [id number]  Referring Provider: [name], MD  DOB: 08-02-1926  PCP: [name], MD  DOS: 07/10/2018  Note by: Gillis Santa, MD  Service setting: Ambulatory outpatient  Specialty: Interventional Pain Management  Location: [facility] (AMB) Pain Management Facility    Patient type: Established   Primary Reason(s) for Visit: Encounter for post-procedure evaluation of chronic illness with mild to moderate exacerbation CC: Back Pain (lower)  HPI  Travis Schultz is a 84 y.o. year old, male patient, who comes today for a post-procedure evaluation. He has Lumbar facet arthropathy; Spondylosis without myelopathy or radiculopathy, lumbar region; Lumbar degenerative disc disease; Bilateral primary osteoarthritis of knee; and Chronic pain syndrome on their problem list. His primarily concern today is the Back Pain (lower)  Pain Assessment: Location: Lower Back Radiating: denies Onset: More than a month ago Duration: Chronic pain Quality: (when pain hits, aches) Severity: 0-No pain/10 (subjective, self-reported pain score)  Note: Reported level is compatible with observation.                         When using our objective Pain Scale, levels between 6 and 10/10 are said to belong in an emergency room, as it progressively worsens from a 6/10, described as severely limiting, requiring emergency care not usually available at an outpatient pain management facility. At a 6/10 level, communication becomes difficult and requires great effort. Assistance to reach the emergency department may be required. Facial flushing and profuse sweating along with potentially dangerous increases in heart rate and blood pressure will be evident. Effect on ADL: difficult to walk long distances without requiring rest periods Timing: Intermittent Modifying factors: procedures, sitting BP: 126/71  HR: 65  Travis Schultz comes in today for post-procedure evaluation after the  treatment done on 06/12/2018.  Further details on both, my assessment(s), as well as the proposed treatment plan, please see below.  Post-Procedure Assessment  06/11/2018 Procedure: L3, L4, L5 medial branch nerve block #2 Pre-procedure pain score:  8/10 Post-procedure pain score: 0/10         Influential Factors: BMI: 25.77 kg/m Intra-procedural challenges: None observed.         Assessment challenges: None detected.              Reported side-effects: None.        Post-procedural adverse reactions or complications: None reported         Sedation: Please see nurses note. When no sedatives are used, the analgesic levels obtained are directly associated to the effectiveness of the local anesthetics. However, when sedation is provided, the level of analgesia obtained during the initial 1 hour following the intervention, is believed to be the result of a combination of factors. These factors may include, but are not limited to: 1. The effectiveness of the local anesthetics used. 2. The effects of the analgesic(s) and/or anxiolytic(s) used. 3. The degree of discomfort experienced by the patient at the time of the procedure. 4. The patients ability and reliability in recalling and recording the events. 5. The presence and influence of possible secondary gains and/or psychosocial factors. Reported result: Relief experienced during the 1st hour after the procedure: 100 % (Ultra-Short Term Relief)            Interpretative annotation: Clinically appropriate result. Analgesia during this period is likely to be Local Anesthetic and/or IV Sedative (Analgesic/Anxiolytic) related.          Effects of  local anesthetic: The analgesic effects attained during this period are directly associated to the localized infiltration of local anesthetics and therefore cary significant diagnostic value as to the etiological location, or anatomical origin, of the pain. Expected duration of relief is directly dependent on  the pharmacodynamics of the local anesthetic used. Long-acting (4-6 hours) anesthetics used.  Reported result: Relief during the next 4 to 6 hour after the procedure: 100 % (Short-Term Relief)            Interpretative annotation: Clinically appropriate result. Analgesia during this period is likely to be Local Anesthetic-related.          Long-term benefit: Defined as the period of time past the expected duration of local anesthetics (1 hour for short-acting and 4-6 hours for long-acting). With the possible exception of prolonged sympathetic blockade from the local anesthetics, benefits during this period are typically attributed to, or associated with, other factors such as analgesic sensory neuropraxia, antiinflammatory effects, or beneficial biochemical changes provided by agents other than the local anesthetics.  Reported result: Extended relief following procedure: 50 %(depending on activity level, 50-100% better) (Long-Term Relief)            Interpretative annotation: Clinically possible results. Good relief. No permanent benefit expected. Inflammation plays a part in the etiology to the pain.          Current benefits: Defined as reported results that persistent at this point in time.   Analgesia: 50-75 %            Function: Somewhat improved ROM: Somewhat improved Interpretative annotation: Recurrence of symptoms. Therapeutic benefit observed. Effective diagnostic intervention.          Interpretation: Results would suggest a successful diagnostic intervention.                  Plan:  Proceed with Radiofrequency Ablation for the purpose of attaining long-term benefits.                Laboratory Chemistry  Inflammation Markers (CRP: Acute Phase) (ESR: Chronic Phase) No results found for: CRP, ESRSEDRATE, LATICACIDVEN                       Rheumatology Markers No results found for: RF, ANA, LABURIC, URICUR, LYMEIGGIGMAB, LYMEABIGMQN, HLAB27                      Renal Function  Markers Lab Results  Component Value Date   BUN 22 (H) 03/06/2017   CREATININE 0.99 03/06/2017   GFRAA >60 03/06/2017   GFRNONAA >60 03/06/2017                             Hepatic Function Markers Lab Results  Component Value Date   AST 34 03/06/2017   ALT 24 03/06/2017   ALBUMIN 4.2 03/06/2017   ALKPHOS 75 03/06/2017                        Electrolytes Lab Results  Component Value Date   NA 140 03/06/2017   K 4.3 03/06/2017   CL 107 03/06/2017   CALCIUM 9.3 03/06/2017                        Neuropathy Markers No results found for: VITAMINB12, FOLATE, HGBA1C, HIV  CNS Tests No results found for: COLORCSF, APPEARCSF, RBCCOUNTCSF, WBCCSF, POLYSCSF, LYMPHSCSF, EOSCSF, PROTEINCSF, GLUCCSF, JCVIRUS, CSFOLI, IGGCSF                      Bone Pathology Markers No results found for: VD25OH, [id number], [id number], [id number], 25OHVITD1, 25OHVITD2, 25OHVITD3, TESTOFREE, TESTOSTERONE                       Coagulation Parameters Lab Results  Component Value Date   PLT 193 03/06/2017                        Cardiovascular Markers Lab Results  Component Value Date   TROPONINI <0.03 03/06/2017   HGB 13.6 03/06/2017   HCT 38.3 (L) 03/06/2017                         CA Markers No results found for: CEA, CA125, LABCA2                      Note: Lab results reviewed.  Recent Diagnostic Imaging Results  DG C-Arm 1-60 Min-No Report Fluoroscopy was utilized by the requesting physician.  No radiographic  interpretation.   Complexity Note: Imaging results reviewed. Results shared with Travis Schultz, using Layman's terms.                         Meds   Current Outpatient Medications:  .  aspirin 81 MG tablet, Take 81 mg by mouth daily., Disp: , Rfl:  .  docusate sodium (COLACE) 100 MG capsule, Take 1 capsule by mouth as needed., Disp: , Rfl:  .  Multiple Vitamins-Minerals (CENTRUM SILVER 50+MEN PO), Take 1 tablet by mouth daily., Disp: , Rfl:  .  Multiple  Vitamins-Minerals (ICAPS AREDS 2) CAPS, Take 1 capsule by mouth 2 (two) times daily., Disp: , Rfl:  .  naproxen sodium (ALEVE) 220 MG tablet, Take 220 mg by mouth., Disp: , Rfl:  .  omeprazole (PRILOSEC) 10 MG capsule, Take 10 mg by mouth daily., Disp: , Rfl:  .  pravastatin (PRAVACHOL) 40 MG tablet, Take 40 mg by mouth daily., Disp: , Rfl:  .  multivitamin-iron-minerals-folic acid (CENTRUM) chewable tablet, Chew 1 tablet by mouth daily., Disp: , Rfl:   ROS  Constitutional: Denies any fever or chills Gastrointestinal: No reported hemesis, hematochezia, vomiting, or acute GI distress Musculoskeletal: Denies any acute onset joint swelling, redness, loss of ROM, or weakness Neurological: No reported episodes of acute onset apraxia, aphasia, dysarthria, agnosia, amnesia, paralysis, loss of coordination, or loss of consciousness  Allergies  Travis Schultz is allergic to phenergan [promethazine hcl].  PFSH  Drug: Travis Schultz  reports that he does not use drugs. Alcohol:  reports that he does not drink alcohol. Tobacco:  reports that he has never smoked. He has never used smokeless tobacco. Medical:  has a past medical history of Lymphoma ([place]) (1991) and Myocardial infarction ([place]) (06/17/2009). Surgical: Travis Schultz  has a past surgical history that includes Rotator cuff repair (Right, 2008); Eye surgery (Bilateral, 2012); Carotid angioplasty (N/A, 02/24/2010); Cataract extraction (Bilateral, 2012); and Back surgery. Family: family history includes Heart disease in his brother and father.  Constitutional Exam  General appearance: Well nourished, well developed, and well hydrated. In no apparent acute distress Vitals:   07/10/18 1145  BP: 126/71  Pulse: 65  Temp: 98.5 F (36.9 C)  TempSrc: Oral  SpO2: 100%  Weight: 190 lb (86.2 kg)  Height: 6' (1.829 m)   BMI Assessment: Estimated body mass index is 25.77 kg/m as calculated from the following:   Height as of this encounter: 6' (1.829  m).   Weight as of this encounter: 190 lb (86.2 kg).  BMI interpretation table: BMI level Category Range association with higher incidence of chronic pain  <18 kg/m2 Underweight   18.5-24.9 kg/m2 Ideal body weight   25-29.9 kg/m2 Overweight Increased incidence by 20%  30-34.9 kg/m2 Obese (Class I) Increased incidence by 68%  35-39.9 kg/m2 Severe obesity (Class II) Increased incidence by 136%  >40 kg/m2 Extreme obesity (Class III) Increased incidence by 254%   Patient's current BMI Ideal Body weight  Body mass index is 25.77 kg/m. Ideal body weight: 77.6 kg (171 lb 1.2 oz) Adjusted ideal body weight: 81 kg (178 lb 10.3 oz)   BMI Readings from Last 4 Encounters:  07/10/18 25.77 kg/m  06/11/18 25.09 kg/m  05/21/18 25.09 kg/m  04/25/18 25.09 kg/m   Wt Readings from Last 4 Encounters:  07/10/18 190 lb (86.2 kg)  06/11/18 185 lb (83.9 kg)  05/21/18 185 lb (83.9 kg)  04/25/18 185 lb (83.9 kg)  Psych/Mental status: Alert, oriented x 3 (person, place, & time)       Eyes: PERLA Respiratory: No evidence of acute respiratory distress  Cervical Spine Area Exam  Skin & Axial Inspection: No masses, redness, edema, swelling, or associated skin lesions Alignment: Symmetrical Functional ROM: Unrestricted ROM      Stability: No instability detected Muscle Tone/Strength: Functionally intact. No obvious neuro-muscular anomalies detected. Sensory (Neurological): Unimpaired Palpation: No palpable anomalies              Upper Extremity (UE) Exam    Side: Right upper extremity  Side: Left upper extremity  Skin & Extremity Inspection: Skin color, temperature, and hair growth are WNL. No peripheral edema or cyanosis. No masses, redness, swelling, asymmetry, or associated skin lesions. No contractures.  Skin & Extremity Inspection: Skin color, temperature, and hair growth are WNL. No peripheral edema or cyanosis. No masses, redness, swelling, asymmetry, or associated skin lesions. No contractures.   Functional ROM: Unrestricted ROM          Functional ROM: Unrestricted ROM          Muscle Tone/Strength: Functionally intact. No obvious neuro-muscular anomalies detected.  Muscle Tone/Strength: Functionally intact. No obvious neuro-muscular anomalies detected.  Sensory (Neurological): Unimpaired          Sensory (Neurological): Unimpaired          Palpation: No palpable anomalies              Palpation: No palpable anomalies              Provocative Test(s):  Phalen's test: deferred Tinel's test: deferred Apley's scratch test (touch opposite shoulder):  Action 1 (Across chest): deferred Action 2 (Overhead): deferred Action 3 (LB reach): deferred   Provocative Test(s):  Phalen's test: deferred Tinel's test: deferred Apley's scratch test (touch opposite shoulder):  Action 1 (Across chest): deferred Action 2 (Overhead): deferred Action 3 (LB reach): deferred    Thoracic Spine Area Exam  Skin & Axial Inspection: No masses, redness, or swelling Alignment: Symmetrical Functional ROM: Unrestricted ROM Stability: No instability detected Muscle Tone/Strength: Functionally intact. No obvious neuro-muscular anomalies detected. Sensory (Neurological): Unimpaired Muscle strength & Tone: No palpable anomalies  Lumbar Spine Area Exam  Skin & Axial Inspection: No masses,  redness, or swelling Alignment: Symmetrical Functional ROM: Decreased ROM affecting both sides Stability: No instability detected Muscle Tone/Strength: Functionally intact. No obvious neuro-muscular anomalies detected. Sensory (Neurological): Musculoskeletal pain pattern Palpation: No palpable anomalies       Provocative Tests: Hyperextension/rotation test: (+) bilaterally for facet joint pain. Lumbar quadrant test (Kemp's test): deferred today       Lateral bending test: deferred today       Patrick's Maneuver: deferred today                   FABER test: deferred today                   S-I anterior  distraction/compression test: deferred today         S-I lateral compression test: deferred today         S-I Thigh-thrust test: deferred today         S-I Gaenslen's test: deferred today          Gait & Posture Assessment  Ambulation: Unassisted Gait: Relatively normal for age and body habitus Posture: WNL   Lower Extremity Exam    Side: Right lower extremity  Side: Left lower extremity  Stability: No instability observed          Stability: No instability observed          Skin & Extremity Inspection: Skin color, temperature, and hair growth are WNL. No peripheral edema or cyanosis. No masses, redness, swelling, asymmetry, or associated skin lesions. No contractures.  Skin & Extremity Inspection: Skin color, temperature, and hair growth are WNL. No peripheral edema or cyanosis. No masses, redness, swelling, asymmetry, or associated skin lesions. No contractures.  Functional ROM: Unrestricted ROM                  Functional ROM: Unrestricted ROM                  Muscle Tone/Strength: Functionally intact. No obvious neuro-muscular anomalies detected.  Muscle Tone/Strength: Functionally intact. No obvious neuro-muscular anomalies detected.  Sensory (Neurological): Unimpaired  Sensory (Neurological): Unimpaired  Palpation: No palpable anomalies  Palpation: No palpable anomalies   Assessment  Primary Diagnosis & Pertinent Problem List: The primary encounter diagnosis was Lumbar facet arthropathy. Diagnoses of Spondylosis without myelopathy or radiculopathy, lumbar region and Chronic pain syndrome were also pertinent to this visit.  Status Diagnosis  Responding Responding Responding 1. Lumbar facet arthropathy   2. Spondylosis without myelopathy or radiculopathy, lumbar region   3. Chronic pain syndrome     General Recommendations: The pain condition that the patient suffers from is best treated with a multidisciplinary approach that involves an increase in physical activity to prevent  de-conditioning and worsening of the pain cycle, as well as psychological counseling (formal and/or informal) to address the co-morbid psychological affects of pain. Treatment will often involve judicious use of pain medications and interventional procedures to decrease the pain, allowing the patient to participate in the physical activity that will ultimately produce long-lasting pain reductions. The goal of the multidisciplinary approach is to return the patient to a higher level of overall function and to restore their ability to perform activities of daily living.   84 year old male with a history of axial low back pain secondary to lumbar facet arthropathy and lumbar spondylosis as follows up status post bilateral L3, L4, L5 lumbar facet medial branch nerve block #2 on 06/12/19 which he states provided him with significant, greater than 50% pain  relief for the first 2 weeks after the procedure.  Patient is currently endorsing approximately 50% ongoing pain relief after his second facet medial branch nerve block He states that he was able to ambulate for an extended period of time without as much pain.  Patient usually only has pain when he is walking.  He is pain-free when he is sitting down.  We discussed performing lumbar radiofrequency ablation starting with the right side first at L3, L4, L5.  Risks and benefits of radiofrequency ablation were discussed with the patient and patient would like to proceed.  In regards to the patient's bilateral knee pain, can consider bilateral genicular nerve block and possiblegenicular nerve radiofrequency ablation  Plan: -Status post 2 successful, positive diagnostic facet medial branch nerve blocks at L3, L4, L5 bilaterally on 04/25/2018 and 05/21/2018 -Plan for right L3, L4, L5 radiofrequency ablation followed by left   Future considerations: left GNB  Lab-work, procedure(s), and/or referral(s): Orders Placed This Encounter  Procedures  .  Radiofrequency,Lumbar   Time Note: Greater than 50% of the 25 minute(s) of face-to-face time spent with Travis Schultz, was spent in counseling/coordination of care regarding: Travis Schultz primary cause of pain, the treatment plan, treatment alternatives, the risks and possible complications of proposed treatment, going over the informed consent, the results, interpretation and significance of  his recent diagnostic interventional treatment(s), realistic expectations and the goals of pain management (increased in functionality).  Provider-requested follow-up: Return in about 3 weeks (around 07/31/2018) for Procedure.  No future appointments.  Primary Care Physician: [name], MD Location: [facility] Note by: Gillis Santa, M.D Date: 07/10/2018; Time: 12:21 PM  There are no Patient Instructions on file for this visit.

## 2018-07-10 NOTE — Progress Notes (Signed)
Safety precautions to be maintained throughout the outpatient stay will include: orient to surroundings, keep bed in low position, maintain call bell within reach at all times, provide assistance with transfer out of bed and ambulation.  

## 2018-08-06 ENCOUNTER — Encounter: Payer: Self-pay | Admitting: Student in an Organized Health Care Education/Training Program

## 2018-08-06 ENCOUNTER — Ambulatory Visit (HOSPITAL_BASED_OUTPATIENT_CLINIC_OR_DEPARTMENT_OTHER): Payer: Medicare PPO | Admitting: Student in an Organized Health Care Education/Training Program

## 2018-08-06 ENCOUNTER — Ambulatory Visit
Admission: RE | Admit: 2018-08-06 | Discharge: 2018-08-06 | Disposition: A | Discharge status: Home / Self Care | Payer: Medicare PPO | Source: Ambulatory Visit | Attending: Student in an Organized Health Care Education/Training Program | Admitting: Student in an Organized Health Care Education/Training Program

## 2018-08-06 ENCOUNTER — Other Ambulatory Visit: Payer: Self-pay

## 2018-08-06 DIAGNOSIS — M545 Low back pain: Secondary | ICD-10-CM | POA: Diagnosis present

## 2018-08-06 DIAGNOSIS — M47816 Spondylosis without myelopathy or radiculopathy, lumbar region: Secondary | ICD-10-CM | POA: Diagnosis not present

## 2018-08-06 DIAGNOSIS — Z79899 Other long term (current) drug therapy: Secondary | ICD-10-CM | POA: Insufficient documentation

## 2018-08-06 DIAGNOSIS — Z888 Allergy status to other drugs, medicaments and biological substances status: Secondary | ICD-10-CM | POA: Diagnosis not present

## 2018-08-06 DIAGNOSIS — M1288 Other specific arthropathies, not elsewhere classified, other specified site: Secondary | ICD-10-CM | POA: Diagnosis not present

## 2018-08-06 DIAGNOSIS — G8929 Other chronic pain: Secondary | ICD-10-CM | POA: Diagnosis not present

## 2018-08-06 MED ORDER — DIAZEPAM 5 MG PO TABS
2.5000 mg | ORAL_TABLET | Freq: Once | ORAL | Status: AC
  Administered 2018-08-06: 2.5 mg via ORAL

## 2018-08-06 MED ORDER — LACTATED RINGERS IV SOLN: 1000.0000 mL | Freq: Once | INTRAVENOUS | Status: DC

## 2018-08-06 MED ORDER — ROPIVACAINE HCL 2 MG/ML IJ SOLN
10.0000 mL | Freq: Once | INTRAMUSCULAR | Status: AC
  Administered 2018-08-06: 10 mL
  Filled 2018-08-06: qty 10

## 2018-08-06 MED ORDER — DIAZEPAM 5 MG PO TABS
ORAL_TABLET | ORAL | Status: AC
  Filled 2018-08-06: qty 1

## 2018-08-06 MED ORDER — LIDOCAINE HCL 2 % IJ SOLN
20.0000 mL | Freq: Once | INTRAMUSCULAR | Status: AC
  Administered 2018-08-06: 400 mg
  Filled 2018-08-06: qty 40

## 2018-08-06 MED ORDER — FENTANYL CITRATE (PF) 100 MCG/2ML IJ SOLN
25.0000 ug | INTRAMUSCULAR | Status: DC | PRN
  Filled 2018-08-06: qty 2

## 2018-08-06 MED ORDER — DEXAMETHASONE SODIUM PHOSPHATE 10 MG/ML IJ SOLN
10.0000 mg | Freq: Once | INTRAMUSCULAR | Status: AC
  Administered 2018-08-06: 10 mg
  Filled 2018-08-06: qty 1

## 2018-08-06 MED FILL — Diazepam Tab 5 MG: ORAL | Qty: 0.5 | Status: AC

## 2018-08-06 NOTE — Progress Notes (Signed)
Safety precautions to be maintained throughout the outpatient stay will include: orient to surroundings, keep bed in low position, maintain call bell within reach at all times, provide assistance with transfer out of bed and ambulation.  

## 2018-08-06 NOTE — Patient Instructions (Signed)

## 2018-08-06 NOTE — Progress Notes (Signed)
Patient's Name: Travis Schultz  MRN: [id number]  Referring Provider: Gillis Santa, MD  DOB: [birth year]  PCP: [name], MD  DOS: 08/06/2018  Note by: Gillis Santa, MD  Service setting: Ambulatory outpatient  Specialty: Interventional Pain Management  Patient type: Established  Location: [facility] (AMB) Pain Management Facility  Visit type: Interventional Procedure   Primary Reason for Visit: Interventional Pain Management Treatment. CC: Back Pain (lower)  Procedure:          Anesthesia, Analgesia, Anxiolysis:  Type: Thermal Lumbar Facet, Medial Branch Radiofrequency Ablation/Neurotomy           Primary Purpose: Therapeutic Region: Posterolateral Lumbosacral Spine Level: L3, L4, L5,  Medial Branch Level(s). These levels will denervate the L3-4, L4-5, lumbar facet joints. Laterality: Right  Type: Local Anesthesia + PO Valium Indication(s): Analgesia and Anxiety Route: Infiltration ([place]/IM) and PO Valium  Local Anesthetic: Lidocaine 1-2%  Position: Prone   Indications: 1. Lumbar facet arthropathy   2. Spondylosis without myelopathy or radiculopathy, lumbar region    Mr. Travis Schultz has been dealing with the above chronic pain for longer than three months and has either failed to respond, was unable to tolerate, or simply did not get enough benefit from other more conservative therapies including, but not limited to: 1. Over-the-counter medications 2. Anti-inflammatory medications 3. Muscle relaxants 4. Membrane stabilizers 5. Opioids 6. Physical therapy and/or chiropractic manipulation 7. Modalities (Heat, ice, etc.) 8. Invasive techniques such as nerve blocks. Mr. Travis Schultz has attained more than 50% relief of the pain from a series of diagnostic injections conducted in separate occasions.  Pain Score: Pre-procedure: 5 /10 Post-procedure: 5 /10  Pre-op Assessment:  Mr. Travis Schultz is a 84 y.o. (year old), male patient, seen today for interventional treatment. He  has a past  surgical history that includes Rotator cuff repair (Right, 2008); Eye surgery (Bilateral, 2012); Carotid angioplasty (N/A, 02/24/2010); Cataract extraction (Bilateral, 2012); and Back surgery. Mr. Travis Schultz has a current medication list which includes the following prescription(s): aspirin, docusate sodium, multiple vitamins-minerals, icaps areds 2, multivitamin-iron-minerals-folic acid, naproxen sodium, omeprazole, and pravastatin, and the following Facility-Administered Medications: fentanyl and lactated ringers. His primarily concern today is the Back Pain (lower)  Initial Vital Signs:  Pulse/HCG Rate: 68ECG Heart Rate: 70 Temp: 97.8 F (36.6 C) Resp: 16 BP: 129/60 SpO2: 100 %  BMI: Estimated body mass index is 25.09 kg/m as calculated from the following:   Height as of this encounter: 6' (1.829 m).   Weight as of this encounter: 185 lb (83.9 kg).  Risk Assessment: Allergies: Reviewed. He is allergic to phenergan [promethazine hcl].  Allergy Precautions: None required Coagulopathies: Reviewed. None identified.  Blood-thinner therapy: None at this time Active Infection(s): Reviewed. None identified. Mr. Travis Schultz is afebrile  Site Confirmation: Mr. Travis Schultz was asked to confirm the procedure and laterality before marking the site Procedure checklist: Completed Consent: Before the procedure and under the influence of no sedative(s), amnesic(s), or anxiolytics, the patient was informed of the treatment options, risks and possible complications. To fulfill our ethical and legal obligations, as recommended by the American Medical Association's Code of Ethics, I have informed the patient of my clinical impression; the nature and purpose of the treatment or procedure; the risks, benefits, and possible complications of the intervention; the alternatives, including doing nothing; the risk(s) and benefit(s) of the alternative treatment(s) or procedure(s); and the risk(s) and benefit(s) of doing  nothing. The patient was provided information about the general risks and possible complications associated with the procedure.  These may include, but are not limited to: failure to achieve desired goals, infection, bleeding, organ or nerve damage, allergic reactions, paralysis, and death. In addition, the patient was informed of those risks and complications associated to Spine-related procedures, such as failure to decrease pain; infection (i.e.: Meningitis, epidural or intraspinal abscess); bleeding (i.e.: epidural hematoma, subarachnoid hemorrhage, or any other type of intraspinal or peri-dural bleeding); organ or nerve damage (i.e.: Any type of peripheral nerve, nerve root, or spinal cord injury) with subsequent damage to sensory, motor, and/or autonomic systems, resulting in permanent pain, numbness, and/or weakness of one or several areas of the body; allergic reactions; (i.e.: anaphylactic reaction); and/or death. Furthermore, the patient was informed of those risks and complications associated with the medications. These include, but are not limited to: allergic reactions (i.e.: anaphylactic or anaphylactoid reaction(s)); adrenal axis suppression; blood sugar elevation that in diabetics may result in ketoacidosis or comma; water retention that in patients with history of congestive heart failure may result in shortness of breath, pulmonary edema, and decompensation with resultant heart failure; weight gain; swelling or edema; medication-induced neural toxicity; particulate matter embolism and blood vessel occlusion with resultant organ, and/or nervous system infarction; and/or aseptic necrosis of one or more joints. Finally, the patient was informed that Medicine is not an exact science; therefore, there is also the possibility of unforeseen or unpredictable risks and/or possible complications that may result in a catastrophic outcome. The patient indicated having understood very clearly. We have given  the patient no guarantees and we have made no promises. Enough time was given to the patient to ask questions, all of which were answered to the patient's satisfaction. Mr. Travis Schultz has indicated that he wanted to continue with the procedure. Attestation: I, the ordering provider, attest that I have discussed with the patient the benefits, risks, side-effects, alternatives, likelihood of achieving goals, and potential problems during recovery for the procedure that I have provided informed consent. Date  Time: 08/06/2018  7:53 AM  Pre-Procedure Preparation:  Monitoring: As per clinic protocol. Respiration, ETCO2, SpO2, BP, heart rate and rhythm monitor placed and checked for adequate function Safety Precautions: Patient was assessed for positional comfort and pressure points before starting the procedure. Time-out: I initiated and conducted the "Time-out" before starting the procedure, as per protocol. The patient was asked to participate by confirming the accuracy of the "Time Out" information. Verification of the correct person, site, and procedure were performed and confirmed by me, the nursing staff, and the patient. "Time-out" conducted as per Joint Commission's Universal Protocol (UP.01.01.01). Time: 0845  Description of Procedure:          Laterality: Right Levels:  L3, L4, L5, Medial Branch Level(s), at the L3-4, L4-5,lumbar facet joints. Area Prepped: Lumbosacral Prepping solution: ChloraPrep (2% chlorhexidine gluconate and 70% isopropyl alcohol) Safety Precautions: Aspiration looking for blood return was conducted prior to all injections. At no point did we inject any substances, as a needle was being advanced. Before injecting, the patient was told to immediately notify me if he was experiencing any new onset of "ringing in the ears, or metallic taste in the mouth". No attempts were made at seeking any paresthesias. Safe injection practices and needle disposal techniques used. Medications  properly checked for expiration dates. SDV (single dose vial) medications used. After the completion of the procedure, all disposable equipment used was discarded in the proper designated medical waste containers. Local Anesthesia: Protocol guidelines were followed. The patient was positioned over the fluoroscopy table. The area  was prepped in the usual manner. The time-out was completed. The target area was identified using fluoroscopy. A 12-in long, straight, sterile hemostat was used with fluoroscopic guidance to locate the targets for each level blocked. Once located, the skin was marked with an approved surgical skin marker. Once all sites were marked, the skin (epidermis, dermis, and hypodermis), as well as deeper tissues (fat, connective tissue and muscle) were infiltrated with a small amount of a short-acting local anesthetic, loaded on a 10cc syringe with a 25G, 1.5-in  Needle. An appropriate amount of time was allowed for local anesthetics to take effect before proceeding to the next step. Local Anesthetic: Lidocaine 2.0% The unused portion of the local anesthetic was discarded in the proper designated containers. Technical explanation of process:  Radiofrequency Ablation (RFA)  L3 Medial Branch Nerve RFA: The target area for the L3 medial branch is at the junction of the postero-lateral aspect of the superior articular process and the superior, posterior, and medial edge of the transverse process of L4. Under fluoroscopic guidance, a Radiofrequency needle was inserted until contact was made with os over the superior postero-lateral aspect of the pedicular shadow (target area). Sensory and motor testing was conducted to properly adjust the position of the needle. Once satisfactory placement of the needle was achieved, the numbing solution was slowly injected after negative aspiration for blood. 2.0 mL of the nerve block solution was injected without difficulty or complication. After waiting for at  least 3 minutes, the ablation was performed. Once completed, the needle was removed intact. L4 Medial Branch Nerve RFA: The target area for the L4 medial branch is at the junction of the postero-lateral aspect of the superior articular process and the superior, posterior, and medial edge of the transverse process of L5. Under fluoroscopic guidance, a Radiofrequency needle was inserted until contact was made with os over the superior postero-lateral aspect of the pedicular shadow (target area). Sensory and motor testing was conducted to properly adjust the position of the needle. Once satisfactory placement of the needle was achieved, the numbing solution was slowly injected after negative aspiration for blood. 2.0 mL of the nerve block solution was injected without difficulty or complication. After waiting for at least 3 minutes, the ablation was performed. Once completed, the needle was removed intact. L5 Medial Branch Nerve RFA: The target area for the L5 medial branch is at the junction of the postero-lateral aspect of the superior articular process of S1 and the superior, posterior, and medial edge of the sacral ala. Under fluoroscopic guidance, a Radiofrequency needle was inserted until contact was made with os over the superior postero-lateral aspect of the pedicular shadow (target area). Sensory and motor testing was conducted to properly adjust the position of the needle. Once satisfactory placement of the needle was achieved, the numbing solution was slowly injected after negative aspiration for blood. 2.0 mL of the nerve block solution was injected without difficulty or complication. After waiting for at least 3 minutes, the ablation was performed. Once completed, the needle was removed intact.   Radiofrequency lesioning (ablation):  Radiofrequency Generator: NeuroTherm [id number] Sensory Stimulation Parameters: 50 Hz was used to locate & identify the nerve, making sure that the needle was positioned  such that there was no sensory stimulation below 0.3 V or above 0.7 V. Motor Stimulation Parameters: 2 Hz was used to evaluate the motor component. Care was taken not to lesion any nerves that demonstrated motor stimulation of the lower extremities at an output of less  than 2.5 times that of the sensory threshold, or a maximum of 2.0 V. Lesioning Technique Parameters: Standard Radiofrequency settings. (Not bipolar or pulsed.) Temperature Settings: 80 degrees C Lesioning time: 60 seconds Intra-operative Compliance: Compliant Materials & Medications: Needle(s) (Electrode/Cannula) Type: Teflon-coated, curved tip, Radiofrequency needle(s) Gauge: 22G Length: 10cm Numbing solution: 6 cc solution consisting of 5 cc of 0.2% ropivacaine, 1 cc of Decadron 10 mg/cc.  2 cc injected at each level above pre-lesioning.  The unused portion of the solution was discarded in the proper designated containers.  Once the entire procedure was completed, the treated area was cleaned, making sure to leave some of the prepping solution back to take advantage of its long term bactericidal properties.  Illustration of the posterior view of the lumbar spine and the posterior neural structures. Laminae of L2 through S1 are labeled. DPRL5, dorsal primary ramus of L5; DPRS1, dorsal primary ramus of S1; DPR3, dorsal primary ramus of L3; FJ, facet (zygapophyseal) joint L3-L4; I, inferior articular process of L4; LB1, lateral branch of dorsal primary ramus of L1; IAB, inferior articular branches from L3 medial branch (supplies L4-L5 facet joint); IBP, intermediate branch plexus; MB3, medial branch of dorsal primary ramus of L3; NR3, third lumbar nerve root; S, superior articular process of L5; SAB, superior articular branches from L4 (supplies L4-5 facet joint also); TP3, transverse process of L3.  Vitals:   08/06/18 0853 08/06/18 0858 08/06/18 0903 08/06/18 0912  BP: 138/64 (!) 152/77 (!) 143/65 137/64  Pulse:      Resp: 16 19 17  16   Temp:      TempSrc:      SpO2: 98% 98% 100% 100%  Weight:      Height:        Start Time: 0845 hrs. End Time: 0907 hrs.  Imaging Guidance (Spinal):          Type of Imaging Technique: Fluoroscopy Guidance (Spinal) Indication(s): Assistance in needle guidance and placement for procedures requiring needle placement in or near specific anatomical locations not easily accessible without such assistance. Exposure Time: Please see nurses notes. Contrast: None used. Fluoroscopic Guidance: I was personally present during the use of fluoroscopy. "Tunnel Vision Technique" used to obtain the best possible view of the target area. Parallax error corrected before commencing the procedure. "Direction-depth-direction" technique used to introduce the needle under continuous pulsed fluoroscopy. Once target was reached, antero-posterior, oblique, and lateral fluoroscopic projection used confirm needle placement in all planes. Images permanently stored in EMR. Interpretation: No contrast injected. I personally interpreted the imaging intraoperatively. Adequate needle placement confirmed in multiple planes. Permanent images saved into the patient's record.  Antibiotic Prophylaxis:   Anti-infectives (From admission, onward)   None     Indication(s): None identified  Post-operative Assessment:  Post-procedure Vital Signs:  Pulse/HCG Rate: 6869 Temp: 97.8 F (36.6 C) Resp: 16 BP: 137/64 SpO2: 100 %  EBL: None  Complications: No immediate post-treatment complications observed by team, or reported by patient.  Note: The patient tolerated the entire procedure well. A repeat set of vitals were taken after the procedure and the patient was kept under observation following institutional policy, for this type of procedure. Post-procedural neurological assessment was performed, showing return to baseline, prior to discharge. The patient was provided with post-procedure discharge instructions, including  a section on how to identify potential problems. Should any problems arise concerning this procedure, the patient was given instructions to immediately contact us, at any time, without hesitation. In any case, we plan to  contact the patient by telephone for a follow-up status report regarding this interventional procedure.  Comments:  No additional relevant information.  Plan of Care   Imaging Orders     DG C-Arm 1-60 Min-No Report  Procedure Orders     Radiofrequency,Lumbar Follow-up for left L3, L4, L5 RFA.  Medications ordered for procedure: Meds ordered this encounter  Medications  . lactated ringers infusion 1,000 mL  . fentaNYL (SUBLIMAZE) injection 25-100 mcg    Make sure Narcan is available in the pyxis when using this medication. In the event of respiratory depression (RR< 8/min): Titrate NARCAN (naloxone) in increments of 0.1 to 0.2 mg IV at 2-3 minute intervals, until desired degree of reversal.  . ropivacaine (PF) 2 mg/mL (0.2%) (NAROPIN) injection 10 mL  . lidocaine (XYLOCAINE) 2 % (with pres) injection 400 mg  . dexamethasone (DECADRON) injection 10 mg  . diazepam (VALIUM) tablet 2.5 mg   Medications administered: We administered ropivacaine (PF) 2 mg/mL (0.2%), lidocaine, dexamethasone, and diazepam.  See the medical record for exact dosing, route, and time of administration.  Disposition: Discharge home  Discharge Date & Time: 08/06/2018; 0920 hrs.   Physician-requested Follow-up: Return in about 3 weeks (around 08/27/2018) for Contra-lateral RFA.  No future appointments. Primary Care Physician: [name], MD Location: [facility] Note by: Gillis Santa, MD Date: 08/06/2018; Time: 9:16 AM  Disclaimer:  Medicine is not an exact science. The only guarantee in medicine is that nothing is guaranteed. It is important to note that the decision to proceed with this intervention was based on the information collected from the  patient. The Data and conclusions were drawn from the patient's questionnaire, the interview, and the physical examination. Because the information was provided in large part by the patient, it cannot be guaranteed that it has not been purposely or unconsciously manipulated. Every effort has been made to obtain as much relevant data as possible for this evaluation. It is important to note that the conclusions that lead to this procedure are derived in large part from the available data. Always take into account that the treatment will also be dependent on availability of resources and existing treatment guidelines, considered by other Pain Management Practitioners as being common knowledge and practice, at the time of the intervention. For Medico-Legal purposes, it is also important to point out that variation in procedural techniques and pharmacological choices are the acceptable norm. The indications, contraindications, technique, and results of the above procedure should only be interpreted and judged by a Board-Certified Interventional Pain Specialist with extensive familiarity and expertise in the same exact procedure and technique.

## 2018-08-07 ENCOUNTER — Telehealth: Payer: Self-pay

## 2018-08-07 NOTE — Telephone Encounter (Signed)
Left message on answering machine to call if needed. 

## 2018-09-10 ENCOUNTER — Ambulatory Visit (HOSPITAL_BASED_OUTPATIENT_CLINIC_OR_DEPARTMENT_OTHER): Payer: Medicare PPO | Admitting: Student in an Organized Health Care Education/Training Program

## 2018-09-10 ENCOUNTER — Encounter: Payer: Self-pay | Admitting: Student in an Organized Health Care Education/Training Program

## 2018-09-10 ENCOUNTER — Ambulatory Visit
Admission: RE | Admit: 2018-09-10 | Discharge: 2018-09-10 | Disposition: A | Discharge status: Home / Self Care | Payer: Medicare PPO | Source: Ambulatory Visit | Attending: Student in an Organized Health Care Education/Training Program | Admitting: Student in an Organized Health Care Education/Training Program

## 2018-09-10 DIAGNOSIS — M47816 Spondylosis without myelopathy or radiculopathy, lumbar region: Secondary | ICD-10-CM | POA: Diagnosis not present

## 2018-09-10 DIAGNOSIS — G8929 Other chronic pain: Secondary | ICD-10-CM | POA: Insufficient documentation

## 2018-09-10 DIAGNOSIS — M545 Low back pain: Secondary | ICD-10-CM | POA: Diagnosis present

## 2018-09-10 MED ORDER — DEXAMETHASONE SODIUM PHOSPHATE 10 MG/ML IJ SOLN
INTRAMUSCULAR | Status: AC
  Filled 2018-09-10: qty 1

## 2018-09-10 MED ORDER — ROPIVACAINE HCL 2 MG/ML IJ SOLN
INTRAMUSCULAR | Status: AC
  Filled 2018-09-10: qty 10

## 2018-09-10 MED ORDER — LIDOCAINE HCL 2 % IJ SOLN
20.0000 mL | Freq: Once | INTRAMUSCULAR | Status: AC
  Administered 2018-09-10: 400 mg

## 2018-09-10 MED ORDER — DIAZEPAM 5 MG PO TABS
2.5000 mg | ORAL_TABLET | Freq: Once | ORAL | Status: AC
  Administered 2018-09-10: 2.5 mg via ORAL

## 2018-09-10 MED ORDER — DEXAMETHASONE SODIUM PHOSPHATE 10 MG/ML IJ SOLN
10.0000 mg | Freq: Once | INTRAMUSCULAR | Status: AC
  Administered 2018-09-10: 10 mg

## 2018-09-10 MED ORDER — ROPIVACAINE HCL 2 MG/ML IJ SOLN
10.0000 mL | Freq: Once | INTRAMUSCULAR | Status: AC
  Administered 2018-09-10: 10 mL

## 2018-09-10 MED ORDER — DIAZEPAM 5 MG PO TABS
ORAL_TABLET | ORAL | Status: AC
  Filled 2018-09-10: qty 1

## 2018-09-10 MED ORDER — LIDOCAINE HCL 2 % IJ SOLN
INTRAMUSCULAR | Status: AC
  Filled 2018-09-10: qty 20

## 2018-09-10 NOTE — Progress Notes (Signed)
Patient's Name: Travis Schultz  MRN: [id number]  Referring Provider: Gillis Santa, MD  DOB: 02-28-1926  PCP: [name], MD  DOS: 09/10/2018  Note by: Gillis Santa, MD  Service setting: Ambulatory outpatient  Specialty: Interventional Pain Management  Patient type: Established  Location: [facility] (AMB) Pain Management Facility  Visit type: Interventional Procedure   Primary Reason for Visit: Interventional Pain Management Treatment. CC: Back Pain (lower back left side )  Procedure:          Anesthesia, Analgesia, Anxiolysis:  Type: Thermal Lumbar Facet, Medial Branch Radiofrequency Ablation/Neurotomy           Primary Purpose: Therapeutic Region: Posterolateral Lumbosacral Spine Level: L3, L4, L5,  Medial Branch Level(s). These levels will denervate the L3-4, L4-5, lumbar facet joints. Laterality: Left  Type: Local Anesthesia + PO Valium Indication(s): Analgesia and Anxiety Route: Infiltration ([place]/IM) and PO Valium  Local Anesthetic: Lidocaine 1-2%  Position: Prone   Indications: 1. Lumbar facet arthropathy    Travis Schultz has been dealing with the above chronic pain for longer than three months and has either failed to respond, was unable to tolerate, or simply did not get enough benefit from other more conservative therapies including, but not limited to: 1. Over-the-counter medications 2. Anti-inflammatory medications 3. Muscle relaxants 4. Membrane stabilizers 5. Opioids 6. Physical therapy and/or chiropractic manipulation 7. Modalities (Heat, ice, etc.) 8. Invasive techniques such as nerve blocks. Travis Schultz has attained more than 50% relief of the pain from a series of diagnostic injections conducted in separate occasions.  Pain Score: Pre-procedure: 5 /10 Post-procedure: 0-No pain/10  Pre-op Assessment:  Travis Schultz is a 84 y.o. (year old), male patient, seen today for interventional treatment. He  has a past surgical history that includes Rotator cuff  repair (Right, 2008); Eye surgery (Bilateral, 2012); Carotid angioplasty (N/A, 02/24/2010); Cataract extraction (Bilateral, 2012); and Back surgery. Travis Schultz has a current medication list which includes the following prescription(s): aspirin, docusate sodium, multiple vitamins-minerals, icaps areds 2, naproxen sodium, omeprazole, pravastatin, and multivitamin-iron-minerals-folic acid. His primarily concern today is the Back Pain (lower back left side )  Initial Vital Signs:  Pulse/HCG Rate: 76ECG Heart Rate: 69 Temp: 97.6 F (36.4 C) Resp: 16 BP: 114/70 SpO2: 99 %  BMI: Estimated body mass index is 25.7 kg/m as calculated from the following:   Height as of this encounter: 6' (1.829 m).   Weight as of this encounter: 189 lb 8 oz (86 kg).  Risk Assessment: Allergies: Reviewed. He is allergic to phenergan [promethazine hcl].  Allergy Precautions: None required Coagulopathies: Reviewed. None identified.  Blood-thinner therapy: None at this time Active Infection(s): Reviewed. None identified. Travis Schultz is afebrile  Site Confirmation: Travis Schultz was asked to confirm the procedure and laterality before marking the site Procedure checklist: Completed Consent: Before the procedure and under the influence of no sedative(s), amnesic(s), or anxiolytics, the patient was informed of the treatment options, risks and possible complications. To fulfill our ethical and legal obligations, as recommended by the American Medical Association's Code of Ethics, I have informed the patient of my clinical impression; the nature and purpose of the treatment or procedure; the risks, benefits, and possible complications of the intervention; the alternatives, including doing nothing; the risk(s) and benefit(s) of the alternative treatment(s) or procedure(s); and the risk(s) and benefit(s) of doing nothing. The patient was provided information about the general risks and possible complications associated with the  procedure. These may include, but are not limited to: failure  to achieve desired goals, infection, bleeding, organ or nerve damage, allergic reactions, paralysis, and death. In addition, the patient was informed of those risks and complications associated to Spine-related procedures, such as failure to decrease pain; infection (i.e.: Meningitis, epidural or intraspinal abscess); bleeding (i.e.: epidural hematoma, subarachnoid hemorrhage, or any other type of intraspinal or peri-dural bleeding); organ or nerve damage (i.e.: Any type of peripheral nerve, nerve root, or spinal cord injury) with subsequent damage to sensory, motor, and/or autonomic systems, resulting in permanent pain, numbness, and/or weakness of one or several areas of the body; allergic reactions; (i.e.: anaphylactic reaction); and/or death. Furthermore, the patient was informed of those risks and complications associated with the medications. These include, but are not limited to: allergic reactions (i.e.: anaphylactic or anaphylactoid reaction(s)); adrenal axis suppression; blood sugar elevation that in diabetics may result in ketoacidosis or comma; water retention that in patients with history of congestive heart failure may result in shortness of breath, pulmonary edema, and decompensation with resultant heart failure; weight gain; swelling or edema; medication-induced neural toxicity; particulate matter embolism and blood vessel occlusion with resultant organ, and/or nervous system infarction; and/or aseptic necrosis of one or more joints. Finally, the patient was informed that Medicine is not an exact science; therefore, there is also the possibility of unforeseen or unpredictable risks and/or possible complications that may result in a catastrophic outcome. The patient indicated having understood very clearly. We have given the patient no guarantees and we have made no promises. Enough time was given to the patient to ask questions, all of  which were answered to the patient's satisfaction. Travis Schultz has indicated that he wanted to continue with the procedure. Attestation: I, the ordering provider, attest that I have discussed with the patient the benefits, risks, side-effects, alternatives, likelihood of achieving goals, and potential problems during recovery for the procedure that I have provided informed consent. Date  Time: 09/10/2018  8:01 AM  Pre-Procedure Preparation:  Monitoring: As per clinic protocol. Respiration, ETCO2, SpO2, BP, heart rate and rhythm monitor placed and checked for adequate function Safety Precautions: Patient was assessed for positional comfort and pressure points before starting the procedure. Time-out: I initiated and conducted the "Time-out" before starting the procedure, as per protocol. The patient was asked to participate by confirming the accuracy of the "Time Out" information. Verification of the correct person, site, and procedure were performed and confirmed by me, the nursing staff, and the patient. "Time-out" conducted as per Joint Commission's Universal Protocol (UP.01.01.01). Time: 0839  Description of Procedure:          Laterality: Left Levels:  L3, L4, L5, Medial Branch Level(s), at the L3-4, L4-5,lumbar facet joints. Area Prepped: Lumbosacral Prepping solution: ChloraPrep (2% chlorhexidine gluconate and 70% isopropyl alcohol) Safety Precautions: Aspiration looking for blood return was conducted prior to all injections. At no point did we inject any substances, as a needle was being advanced. Before injecting, the patient was told to immediately notify me if he was experiencing any new onset of "ringing in the ears, or metallic taste in the mouth". No attempts were made at seeking any paresthesias. Safe injection practices and needle disposal techniques used. Medications properly checked for expiration dates. SDV (single dose vial) medications used. After the completion of the procedure,  all disposable equipment used was discarded in the proper designated medical waste containers. Local Anesthesia: Protocol guidelines were followed. The patient was positioned over the fluoroscopy table. The area was prepped in the usual manner. The time-out was  completed. The target area was identified using fluoroscopy. A 12-in long, straight, sterile hemostat was used with fluoroscopic guidance to locate the targets for each level blocked. Once located, the skin was marked with an approved surgical skin marker. Once all sites were marked, the skin (epidermis, dermis, and hypodermis), as well as deeper tissues (fat, connective tissue and muscle) were infiltrated with a small amount of a short-acting local anesthetic, loaded on a 10cc syringe with a 25G, 1.5-in  Needle. An appropriate amount of time was allowed for local anesthetics to take effect before proceeding to the next step. Local Anesthetic: Lidocaine 2.0% The unused portion of the local anesthetic was discarded in the proper designated containers. Technical explanation of process:  Radiofrequency Ablation (RFA)  L3 Medial Branch Nerve RFA: The target area for the L3 medial branch is at the junction of the postero-lateral aspect of the superior articular process and the superior, posterior, and medial edge of the transverse process of L4. Under fluoroscopic guidance, a Radiofrequency needle was inserted until contact was made with os over the superior postero-lateral aspect of the pedicular shadow (target area). Sensory and motor testing was conducted to properly adjust the position of the needle. Once satisfactory placement of the needle was achieved, the numbing solution was slowly injected after negative aspiration for blood. 2.0 mL of the nerve block solution was injected without difficulty or complication. After waiting for at least 3 minutes, the ablation was performed. Once completed, the needle was removed intact. L4 Medial Branch Nerve RFA:  The target area for the L4 medial branch is at the junction of the postero-lateral aspect of the superior articular process and the superior, posterior, and medial edge of the transverse process of L5. Under fluoroscopic guidance, a Radiofrequency needle was inserted until contact was made with os over the superior postero-lateral aspect of the pedicular shadow (target area). Sensory and motor testing was conducted to properly adjust the position of the needle. Once satisfactory placement of the needle was achieved, the numbing solution was slowly injected after negative aspiration for blood. 2.0 mL of the nerve block solution was injected without difficulty or complication. After waiting for at least 3 minutes, the ablation was performed. Once completed, the needle was removed intact. L5 Medial Branch Nerve RFA: The target area for the L5 medial branch is at the junction of the postero-lateral aspect of the superior articular process of S1 and the superior, posterior, and medial edge of the sacral ala. Under fluoroscopic guidance, a Radiofrequency needle was inserted until contact was made with os over the superior postero-lateral aspect of the pedicular shadow (target area). Sensory and motor testing was conducted to properly adjust the position of the needle. Once satisfactory placement of the needle was achieved, the numbing solution was slowly injected after negative aspiration for blood. 2.0 mL of the nerve block solution was injected without difficulty or complication. After waiting for at least 3 minutes, the ablation was performed. Once completed, the needle was removed intact.   Radiofrequency lesioning (ablation):  Radiofrequency Generator: NeuroTherm [id number] Sensory Stimulation Parameters: 50 Hz was used to locate & identify the nerve, making sure that the needle was positioned such that there was no sensory stimulation below 0.3 V or above 0.7 V. Motor Stimulation Parameters: 2 Hz was used to  evaluate the motor component. Care was taken not to lesion any nerves that demonstrated motor stimulation of the lower extremities at an output of less than 2.5 times that of the sensory threshold, or  a maximum of 2.0 V. Lesioning Technique Parameters: Standard Radiofrequency settings. (Not bipolar or pulsed.) Temperature Settings: 80 degrees C Lesioning time: 60 seconds Intra-operative Compliance: Compliant Materials & Medications: Needle(s) (Electrode/Cannula) Type: Teflon-coated, curved tip, Radiofrequency needle(s) Gauge: 22G Length: 10cm Numbing solution: 6 cc solution consisting of 5 cc of 0.2% ropivacaine, 1 cc of Decadron 10 mg/cc.  2 cc injected at each level above pre-lesioning.  The unused portion of the solution was discarded in the proper designated containers.  Once the entire procedure was completed, the treated area was cleaned, making sure to leave some of the prepping solution back to take advantage of its long term bactericidal properties.  Illustration of the posterior view of the lumbar spine and the posterior neural structures. Laminae of L2 through S1 are labeled. DPRL5, dorsal primary ramus of L5; DPRS1, dorsal primary ramus of S1; DPR3, dorsal primary ramus of L3; FJ, facet (zygapophyseal) joint L3-L4; I, inferior articular process of L4; LB1, lateral branch of dorsal primary ramus of L1; IAB, inferior articular branches from L3 medial branch (supplies L4-L5 facet joint); IBP, intermediate branch plexus; MB3, medial branch of dorsal primary ramus of L3; NR3, third lumbar nerve root; S, superior articular process of L5; SAB, superior articular branches from L4 (supplies L4-5 facet joint also); TP3, transverse process of L3.  Vitals:   09/10/18 0849 09/10/18 0854 09/10/18 0859 09/10/18 0910  BP: 126/61 135/79 (!) 105/57 (!) 142/81  Pulse:      Resp: 14 17 14 16   Temp:      TempSrc:      SpO2: 96% 97% 96% 100%  Weight:      Height:        Start Time: 0839 hrs. End  Time: 0902 hrs.  Imaging Guidance (Spinal):          Type of Imaging Technique: Fluoroscopy Guidance (Spinal) Indication(s): Assistance in needle guidance and placement for procedures requiring needle placement in or near specific anatomical locations not easily accessible without such assistance. Exposure Time: Please see nurses notes. Contrast: None used. Fluoroscopic Guidance: I was personally present during the use of fluoroscopy. "Tunnel Vision Technique" used to obtain the best possible view of the target area. Parallax error corrected before commencing the procedure. "Direction-depth-direction" technique used to introduce the needle under continuous pulsed fluoroscopy. Once target was reached, antero-posterior, oblique, and lateral fluoroscopic projection used confirm needle placement in all planes. Images permanently stored in EMR. Interpretation: No contrast injected. I personally interpreted the imaging intraoperatively. Adequate needle placement confirmed in multiple planes. Permanent images saved into the patient's record.  Antibiotic Prophylaxis:   Anti-infectives (From admission, onward)   None     Indication(s): None identified  Post-operative Assessment:  Post-procedure Vital Signs:  Pulse/HCG Rate: 7673 Temp: 97.6 F (36.4 C) Resp: 16 BP: (!) 142/81 SpO2: 100 %  EBL: None  Complications: No immediate post-treatment complications observed by team, or reported by patient.  Note: The patient tolerated the entire procedure well. A repeat set of vitals were taken after the procedure and the patient was kept under observation following institutional policy, for this type of procedure. Post-procedural neurological assessment was performed, showing return to baseline, prior to discharge. The patient was provided with post-procedure discharge instructions, including a section on how to identify potential problems. Should any problems arise concerning this procedure, the patient  was given instructions to immediately contact us, at any time, without hesitation. In any case, we plan to contact the patient by telephone for a follow-up  status report regarding this interventional procedure.  Comments:  No additional relevant information.  Plan of Care    Imaging Orders     DG C-Arm 1-60 Min-No Report Procedure Orders    No procedure(s) ordered today     Medications ordered for procedure: Meds ordered this encounter  Medications  . diazepam (VALIUM) tablet 2.5 mg  . ropivacaine (PF) 2 mg/mL (0.2%) (NAROPIN) injection 10 mL  . lidocaine (XYLOCAINE) 2 % (with pres) injection 400 mg  . dexamethasone (DECADRON) injection 10 mg   Medications administered: We administered diazepam, ropivacaine (PF) 2 mg/mL (0.2%), lidocaine, and dexamethasone.  See the medical record for exact dosing, route, and time of administration.  Disposition: Discharge home  Discharge Date & Time: 09/10/2018; 0915 hrs.   Physician-requested Follow-up: Return in about 6 weeks (around 10/22/2018) for Post Procedure Evaluation.  Future Appointments  Date Time Provider [place]  10/18/2018  9:30 AM Gillis Santa, MD [facility] None   Primary Care Physician: [name], MD Location: [facility] Facility Note by: Gillis Santa, MD Date: 09/10/2018; Time: 11:58 AM  Disclaimer:  Medicine is not an exact science. The only guarantee in medicine is that nothing is guaranteed. It is important to note that the decision to proceed with this intervention was based on the information collected from the patient. The Data and conclusions were drawn from the patient's questionnaire, the interview, and the physical examination. Because the information was provided in large part by the patient, it cannot be guaranteed that it has not been purposely or unconsciously manipulated. Every effort has been made to obtain as much relevant data as possible for this evaluation. It is  important to note that the conclusions that lead to this procedure are derived in large part from the available data. Always take into account that the treatment will also be dependent on availability of resources and existing treatment guidelines, considered by other Pain Management Practitioners as being common knowledge and practice, at the time of the intervention. For Medico-Legal purposes, it is also important to point out that variation in procedural techniques and pharmacological choices are the acceptable norm. The indications, contraindications, technique, and results of the above procedure should only be interpreted and judged by a Board-Certified Interventional Pain Specialist with extensive familiarity and expertise in the same exact procedure and technique.

## 2018-09-10 NOTE — Patient Instructions (Signed)

## 2018-09-10 NOTE — Progress Notes (Signed)
Safety precautions to be maintained throughout the outpatient stay will include: orient to surroundings, keep bed in low position, maintain call bell within reach at all times, provide assistance with transfer out of bed and ambulation.  

## 2018-09-11 ENCOUNTER — Telehealth: Payer: Self-pay

## 2018-09-11 NOTE — Telephone Encounter (Signed)
Post procedure phone call.  Patient states that she is doing fine.

## 2018-10-18 ENCOUNTER — Ambulatory Visit: Payer: Medicare PPO | Admitting: Student in an Organized Health Care Education/Training Program

## 2018-10-18 ENCOUNTER — Other Ambulatory Visit: Payer: Self-pay

## 2018-10-18 ENCOUNTER — Ambulatory Visit
Admission: RE | Admit: 2018-10-18 | Discharge: 2018-10-18 | Disposition: A | Discharge status: Home / Self Care | Payer: Medicare PPO | Source: Ambulatory Visit | Attending: Student in an Organized Health Care Education/Training Program | Admitting: Student in an Organized Health Care Education/Training Program

## 2018-10-18 ENCOUNTER — Encounter: Payer: Self-pay | Admitting: Student in an Organized Health Care Education/Training Program

## 2018-10-18 VITALS — BP 123/64 | HR 70 | Temp 97.7°F | Resp 18 | Ht 71.0 in | Wt 189.0 lb

## 2018-10-18 DIAGNOSIS — M25552 Pain in left hip: Secondary | ICD-10-CM | POA: Insufficient documentation

## 2018-10-18 DIAGNOSIS — M25551 Pain in right hip: Secondary | ICD-10-CM

## 2018-10-18 DIAGNOSIS — M5136 Other intervertebral disc degeneration, lumbar region: Secondary | ICD-10-CM | POA: Diagnosis not present

## 2018-10-18 DIAGNOSIS — M47818 Spondylosis without myelopathy or radiculopathy, sacral and sacrococcygeal region: Secondary | ICD-10-CM | POA: Insufficient documentation

## 2018-10-18 DIAGNOSIS — M48061 Spinal stenosis, lumbar region without neurogenic claudication: Secondary | ICD-10-CM | POA: Insufficient documentation

## 2018-10-18 DIAGNOSIS — M47816 Spondylosis without myelopathy or radiculopathy, lumbar region: Secondary | ICD-10-CM

## 2018-10-18 NOTE — Progress Notes (Signed)
Safety precautions to be maintained throughout the outpatient stay will include: orient to surroundings, keep bed in low position, maintain call bell within reach at all times, provide assistance with transfer out of bed and ambulation.  

## 2018-10-18 NOTE — Progress Notes (Signed)
Patient's Name: Travis Schultz  MRN: [id number]  Referring Provider: [name], MD  DOB: Sep 22, 1926  PCP: [name], MD  DOS: 10/18/2018  Note by: Gillis Santa, MD  Service setting: Ambulatory outpatient  Specialty: Interventional Pain Management  Location: [facility] (AMB) Pain Management Facility    Patient type: Established   Primary Reason(s) for Visit: Encounter for post-procedure evaluation of chronic illness with mild to moderate exacerbation CC: Back Pain (lower) and other (upper buttocks, bilaterally)  HPI  Travis Schultz is a 85 y.o. year old, male patient, who comes today for a post-procedure evaluation. He has Lumbar facet arthropathy; Spondylosis without myelopathy or radiculopathy, lumbar region; Lumbar degenerative disc disease; Bilateral primary osteoarthritis of knee; and Chronic pain syndrome on their problem list. His primarily concern today is the Back Pain (lower) and other (upper buttocks, bilaterally)  Pain Assessment: Location: Lower Back Radiating: into top of buttocks, bilaterally Onset: More than a month ago Duration: Chronic pain Quality: Aching, Constant Severity: 8 /10 (subjective, self-reported pain score)  Note: Reported level is inconsistent with clinical observations.                         When using our objective Pain Scale, levels between 6 and 10/10 are said to belong in an emergency room, as it progressively worsens from a 6/10, described as severely limiting, requiring emergency care not usually available at an outpatient pain management facility. At a 6/10 level, communication becomes difficult and requires great effort. Assistance to reach the emergency department may be required. Facial flushing and profuse sweating along with potentially dangerous increases in heart rate and blood pressure will be evident. Effect on ADL: walking causes pain to increase in severity Timing: Intermittent(worse when walking) Modifying factors: no pain when  sitting, aleve BP: 123/64  HR: 70  Mr. Bedwell comes in today for post-procedure evaluation.  Further details on both, my assessment(s), as well as the proposed treatment plan, please see below.  Post-Procedure Assessment  09/10/2018 Procedure: Left L3, L4, L5 RFA; right L3, L4, L5 RFA Pre-procedure pain score:        /10 Post-procedure pain score: 0/10         Influential Factors: BMI: 26.36 kg/m Intra-procedural challenges: None observed.         Assessment challenges: None detected.              Reported side-effects: None.        Post-procedural adverse reactions or complications: None reported         Sedation: Please see nurses note. When no sedatives are used, the analgesic levels obtained are directly associated to the effectiveness of the local anesthetics. However, when sedation is provided, the level of analgesia obtained during the initial 1 hour following the intervention, is believed to be the result of a combination of factors. These factors may include, but are not limited to: 1. The effectiveness of the local anesthetics used. 2. The effects of the analgesic(s) and/or anxiolytic(s) used. 3. The degree of discomfort experienced by the patient at the time of the procedure. 4. The patients ability and reliability in recalling and recording the events. 5. The presence and influence of possible secondary gains and/or psychosocial factors. Reported result: Relief experienced during the 1st hour after the procedure: 100 % (Ultra-Short Term Relief)            Interpretative annotation: Clinically appropriate result. Analgesia during this period is likely to be  Local Anesthetic and/or IV Sedative (Analgesic/Anxiolytic) related.          Effects of local anesthetic: The analgesic effects attained during this period are directly associated to the localized infiltration of local anesthetics and therefore cary significant diagnostic value as to the etiological location, or anatomical  origin, of the pain. Expected duration of relief is directly dependent on the pharmacodynamics of the local anesthetic used. Long-acting (4-6 hours) anesthetics used.  Reported result: Relief during the next 4 to 6 hour after the procedure: 100 % (Short-Term Relief)            Interpretative annotation: Clinically appropriate result. Analgesia during this period is likely to be Local Anesthetic-related.          Long-term benefit: Defined as the period of time past the expected duration of local anesthetics (1 hour for short-acting and 4-6 hours for long-acting). With the possible exception of prolonged sympathetic blockade from the local anesthetics, benefits during this period are typically attributed to, or associated with, other factors such as analgesic sensory neuropraxia, antiinflammatory effects, or beneficial biochemical changes provided by agents other than the local anesthetics.  Reported result: Extended relief following procedure: 100 %(X 1 day) (Long-Term Relief)            Interpretative annotation: Expected clinical result. No benefit. Therapeutic failure. Persistent algesic mechanism detected.          Current benefits: Defined as reported results that persistent at this point in time.   Analgesia: 0 %            Function: No benefit ROM: No benefit Interpretative annotation: Recurrence of symptoms. Therapeutic failure. Results would suggest persistent aggravating factors.          Interpretation: Results would suggest failure of therapy in achieving desired goal(s).                  Plan:  Please see "Plan of Care" for details.                Laboratory Chemistry  Inflammation Markers (CRP: Acute Phase) (ESR: Chronic Phase) No results found for: CRP, ESRSEDRATE, LATICACIDVEN                       Rheumatology Markers No results found for: RF, ANA, LABURIC, URICUR, LYMEIGGIGMAB, LYMEABIGMQN, HLAB27                      Renal Function Markers Lab Results  Component Value  Date   BUN 22 (H) 03/06/2017   CREATININE 0.99 03/06/2017   GFRAA >60 03/06/2017   GFRNONAA >60 03/06/2017                             Hepatic Function Markers Lab Results  Component Value Date   AST 34 03/06/2017   ALT 24 03/06/2017   ALBUMIN 4.2 03/06/2017   ALKPHOS 75 03/06/2017                        Electrolytes Lab Results  Component Value Date   NA 140 03/06/2017   K 4.3 03/06/2017   CL 107 03/06/2017   CALCIUM 9.3 03/06/2017                        Neuropathy Markers No results found for: VITAMINB12, FOLATE, HGBA1C, HIV  CNS Tests No results found for: COLORCSF, APPEARCSF, RBCCOUNTCSF, WBCCSF, POLYSCSF, LYMPHSCSF, EOSCSF, PROTEINCSF, GLUCCSF, JCVIRUS, CSFOLI, IGGCSF                      Bone Pathology Markers No results found for: VD25OH, [id number], EG3151VO1, [id number], 25OHVITD1, 25OHVITD2, 25OHVITD3, TESTOFREE, TESTOSTERONE                       Coagulation Parameters Lab Results  Component Value Date   PLT 193 03/06/2017                        Cardiovascular Markers Lab Results  Component Value Date   TROPONINI <0.03 03/06/2017   HGB 13.6 03/06/2017   HCT 38.3 (L) 03/06/2017                         CA Markers No results found for: CEA, CA125, LABCA2                      Note: Lab results reviewed.  Recent Diagnostic Imaging Results  DG C-Arm 1-60 Min-No Report Fluoroscopy was utilized by the requesting physician.  No radiographic  interpretation.   Complexity Note: Imaging results reviewed. Results shared with Mr. Crabbe, using Layman's terms.                         Meds   Current Outpatient Medications:  .  aspirin 81 MG tablet, Take 81 mg by mouth daily., Disp: , Rfl:  .  docusate sodium (COLACE) 100 MG capsule, Take 1 capsule by mouth as needed., Disp: , Rfl:  .  Multiple Vitamins-Minerals (CENTRUM SILVER 50+MEN PO), Take 1 tablet by mouth daily., Disp: , Rfl:  .  Multiple Vitamins-Minerals (ICAPS AREDS 2) CAPS,  Take 1 capsule by mouth 2 (two) times daily., Disp: , Rfl:  .  multivitamin-iron-minerals-folic acid (CENTRUM) chewable tablet, Chew 1 tablet by mouth daily., Disp: , Rfl:  .  naproxen sodium (ALEVE) 220 MG tablet, Take 220 mg by mouth., Disp: , Rfl:  .  omeprazole (PRILOSEC) 10 MG capsule, Take 10 mg by mouth daily., Disp: , Rfl:  .  pravastatin (PRAVACHOL) 40 MG tablet, Take 40 mg by mouth daily., Disp: , Rfl:   ROS  Constitutional: Denies any fever or chills Gastrointestinal: No reported hemesis, hematochezia, vomiting, or acute GI distress Musculoskeletal: Denies any acute onset joint swelling, redness, loss of ROM, or weakness Neurological: No reported episodes of acute onset apraxia, aphasia, dysarthria, agnosia, amnesia, paralysis, loss of coordination, or loss of consciousness  Allergies  Mr. Cantara is allergic to phenergan [promethazine hcl].  PFSH  Drug: Mr. Tozzi  reports no history of drug use. Alcohol:  reports no history of alcohol use. Tobacco:  reports that he has never smoked. He has never used smokeless tobacco. Medical:  has a past medical history of Lymphoma ([place]) (1991) and Myocardial infarction ([place]) (06/17/2009). Surgical: Mr. Okuda  has a past surgical history that includes Rotator cuff repair (Right, 2008); Eye surgery (Bilateral, 2012); Carotid angioplasty (N/A, 02/24/2010); Cataract extraction (Bilateral, 2012); and Back surgery. Family: family history includes Heart disease in his brother and father.  Constitutional Exam  General appearance: Well nourished, well developed, and well hydrated. In no apparent acute distress Vitals:   10/18/18 0930  BP: 123/64  Pulse: 70  Resp: 18  Temp: 97.7 F (36.5  C)  TempSrc: Oral  SpO2: 100%  Weight: 189 lb (85.7 kg)  Height: '5\' 11"'  (1.803 m)   BMI Assessment: Estimated body mass index is 26.36 kg/m as calculated from the following:   Height as of this encounter: '5\' 11"'  (1.803 m).   Weight as of this  encounter: 189 lb (85.7 kg).  BMI interpretation table: BMI level Category Range association with higher incidence of chronic pain  <18 kg/m2 Underweight   18.5-24.9 kg/m2 Ideal body weight   25-29.9 kg/m2 Overweight Increased incidence by 20%  30-34.9 kg/m2 Obese (Class I) Increased incidence by 68%  35-39.9 kg/m2 Severe obesity (Class II) Increased incidence by 136%  >40 kg/m2 Extreme obesity (Class III) Increased incidence by 254%   Patient's current BMI Ideal Body weight  Body mass index is 26.36 kg/m. Ideal body weight: 75.3 kg (166 lb 0.1 oz) Adjusted ideal body weight: 79.5 kg (175 lb 3.3 oz)   BMI Readings from Last 4 Encounters:  10/18/18 26.36 kg/m  09/10/18 25.70 kg/m  08/06/18 25.09 kg/m  07/10/18 25.77 kg/m   Wt Readings from Last 4 Encounters:  10/18/18 189 lb (85.7 kg)  09/10/18 189 lb 8 oz (86 kg)  08/06/18 185 lb (83.9 kg)  07/10/18 190 lb (86.2 kg)  Psych/Mental status: Alert, oriented x 3 (person, place, & time)       Eyes: PERLA Respiratory: No evidence of acute respiratory distress  Cervical Spine Area Exam  Skin & Axial Inspection: No masses, redness, edema, swelling, or associated skin lesions Alignment: Symmetrical Functional ROM: Unrestricted ROM      Stability: No instability detected Muscle Tone/Strength: Functionally intact. No obvious neuro-muscular anomalies detected. Sensory (Neurological): Unimpaired Palpation: No palpable anomalies              Upper Extremity (UE) Exam    Side: Right upper extremity  Side: Left upper extremity  Skin & Extremity Inspection: Skin color, temperature, and hair growth are WNL. No peripheral edema or cyanosis. No masses, redness, swelling, asymmetry, or associated skin lesions. No contractures.  Skin & Extremity Inspection: Skin color, temperature, and hair growth are WNL. No peripheral edema or cyanosis. No masses, redness, swelling, asymmetry, or associated skin lesions. No contractures.  Functional ROM:  Unrestricted ROM          Functional ROM: Unrestricted ROM          Muscle Tone/Strength: Functionally intact. No obvious neuro-muscular anomalies detected.  Muscle Tone/Strength: Functionally intact. No obvious neuro-muscular anomalies detected.  Sensory (Neurological): Unimpaired          Sensory (Neurological): Unimpaired          Palpation: No palpable anomalies              Palpation: No palpable anomalies              Provocative Test(s):  Phalen's test: deferred Tinel's test: deferred Apley's scratch test (touch opposite shoulder):  Action 1 (Across chest): deferred Action 2 (Overhead): deferred Action 3 (LB reach): deferred   Provocative Test(s):  Phalen's test: deferred Tinel's test: deferred Apley's scratch test (touch opposite shoulder):  Action 1 (Across chest): deferred Action 2 (Overhead): deferred Action 3 (LB reach): deferred    Thoracic Spine Area Exam  Skin & Axial Inspection: No masses, redness, or swelling Alignment: Symmetrical Functional ROM: Unrestricted ROM Stability: No instability detected Muscle Tone/Strength: Functionally intact. No obvious neuro-muscular anomalies detected. Sensory (Neurological): Unimpaired Muscle strength & Tone: No palpable anomalies  Lumbar Spine Area Exam  Skin & Axial Inspection: No masses, redness, or swelling Alignment: Symmetrical Functional ROM: Decreased ROM affecting both sides Stability: No instability detected Muscle Tone/Strength: Functionally intact. No obvious neuro-muscular anomalies detected. Sensory (Neurological): Dermatomal pain pattern and musculoskeletal Palpation: No palpable anomalies       Provocative Tests: Hyperextension/rotation test: (+) due to pain. Lumbar quadrant test (Kemp's test): deferred today       Lateral bending test: (+) due to pain. Patrick's Maneuver: (+) for bilateral S-I arthralgia             FABER* test: deferred today                   S-I anterior distraction/compression test:  deferred today         S-I lateral compression test: deferred today         S-I Thigh-thrust test: deferred today         S-I Gaenslen's test: deferred today         *(Flexion, ABduction and External Rotation)  Gait & Posture Assessment  Ambulation: Unassisted Gait: Relatively normal for age and body habitus Posture: WNL   Lower Extremity Exam    Side: Right lower extremity  Side: Left lower extremity  Stability: No instability observed          Stability: No instability observed          Skin & Extremity Inspection: Skin color, temperature, and hair growth are WNL. No peripheral edema or cyanosis. No masses, redness, swelling, asymmetry, or associated skin lesions. No contractures.  Skin & Extremity Inspection: Skin color, temperature, and hair growth are WNL. No peripheral edema or cyanosis. No masses, redness, swelling, asymmetry, or associated skin lesions. No contractures.  Functional ROM: Unrestricted ROM                  Functional ROM: Unrestricted ROM                  Muscle Tone/Strength: Functionally intact. No obvious neuro-muscular anomalies detected.  Muscle Tone/Strength: Functionally intact. No obvious neuro-muscular anomalies detected.  Sensory (Neurological): Unimpaired        Sensory (Neurological): Unimpaired        DTR: Patellar: deferred today Achilles: deferred today Plantar: deferred today  DTR: Patellar: deferred today Achilles: deferred today Plantar: deferred today  Palpation: No palpable anomalies  Palpation: No palpable anomalies   Assessment  Primary Diagnosis & Pertinent Problem List: The primary encounter diagnosis was Bilateral hip pain. Diagnoses of SI joint arthritis, Lumbar facet arthropathy, Lumbar degenerative disc disease, and Lumbar foraminal stenosis were also pertinent to this visit.  Status Diagnosis  Having a Flare-up Having a Flare-up Persistent 1. Bilateral hip pain   2. SI joint arthritis   3. Lumbar facet arthropathy   4. Lumbar  degenerative disc disease   5. Lumbar foraminal stenosis      Patient follows up status post bilateral lumbar facet RFA at L3, L4, L5.  Patient continues to have ongoing pain which is worse with walking and weightbearing.  He states that his pain is more pronounced in his upper buttock region.  We reviewed the patient's lumbar MRI in great detail today.  L1-L2: Mild disc desiccation and circumferential disc bulge. Mild to moderate facet and ligament flavum hypertrophy with mild progression since 2008. New borderline to mild left lateral recess stenosis. Stable borderline to mild L1 foraminal stenosis.  L2-L3: Chronic disc desiccation. Progressed circumferential disc bulge and broad-based posterior component of disc.  Mild progression of moderate facet and ligament flavum hypertrophy. New moderate to severe spinal stenosis and bilateral lateral recess stenosis. New moderate bilateral L2 foraminal stenosis.  L3-L4: Chronic severe but further progressed disc space loss with circumferential disc osteophyte complex and broad-based posterior component. Chronic moderate to severe facet and ligament flavum hypertrophy with mild progression. Severe spinal and right greater than left lateral recess stenosis appears mildly progressed (series 6, image 16). Mild left and moderate to severe right L3 foraminal stenosis appears stable.  L4-L5: Chronic grade 1 anterolisthesis with severe disc space loss and circumferential disc osteophyte complex with a broad-based posterior component. Chronic moderate ligament flavum and moderate to severe facet hypertrophy. Chronic moderate spinal stenosis and severe right greater than left lateral recess stenosis. Chronic moderate to severe right greater than left L4 foraminal stenosis. This level is stable.  L5-S1: Stable mild circumferential, mostly far lateral disc bulging. Mildly increased mild to moderate facet and ligament flavum hypertrophy. No spinal  or lateral recess stenosis. Moderate left greater than right L5 foraminal stenosis is stable.  Patient has significant multilevel canal and foraminal stenosis which is likely contributing to his symptoms of neurogenic claudication with walking.  I would like to explore the patient's bilateral SI joints as well as hips to see if these could be pain generators for him.  Depending upon imaging results, may consider diagnostic bilateral SI joint injection and/or hip steroid injection.  Plan of Care  Lab-work, procedure(s), and/or referral(s): Orders Placed This Encounter  Procedures  . DG Si Joints  . DG HIP UNILAT W OR W/O PELVIS 2-3 VIEWS LEFT  . DG HIP UNILAT W OR W/O PELVIS 2-3 VIEWS RIGHT    Provider-requested follow-up: Return in about 2 weeks (around 11/01/2018) for After Imaging. Time Note: Greater than 50% of the 25 minute(s) of face-to-face time spent with Mr. Wilner, was spent in counseling/coordination of care regarding: Mr. Stockard primary cause of pain, the results of his recent test(s), the treatment plan, treatment alternatives, the results, interpretation and significance of  his recent diagnostic interventional treatment(s) and realistic expectations.  Future Appointments  Date Time Provider [place]  11/01/2018  8:45 AM Gillis Santa, MD [facility] None    Primary Care Physician: [name], MD Location: [facility] Note by: Gillis Santa, M.D Date: 10/18/2018; Time: 11:36 AM  There are no Patient Instructions on file for this visit.

## 2018-11-01 ENCOUNTER — Encounter: Payer: Self-pay | Admitting: Student in an Organized Health Care Education/Training Program

## 2018-11-01 ENCOUNTER — Other Ambulatory Visit: Payer: Self-pay

## 2018-11-01 ENCOUNTER — Ambulatory Visit
Payer: Medicare PPO | Attending: Student in an Organized Health Care Education/Training Program | Admitting: Student in an Organized Health Care Education/Training Program

## 2018-11-01 VITALS — BP 129/61 | HR 74 | Temp 97.6°F | Resp 18 | Ht 71.0 in | Wt 190.0 lb

## 2018-11-01 DIAGNOSIS — M25552 Pain in left hip: Secondary | ICD-10-CM | POA: Diagnosis present

## 2018-11-01 DIAGNOSIS — M47818 Spondylosis without myelopathy or radiculopathy, sacral and sacrococcygeal region: Secondary | ICD-10-CM | POA: Diagnosis present

## 2018-11-01 DIAGNOSIS — M5136 Other intervertebral disc degeneration, lumbar region: Secondary | ICD-10-CM

## 2018-11-01 DIAGNOSIS — M47816 Spondylosis without myelopathy or radiculopathy, lumbar region: Secondary | ICD-10-CM

## 2018-11-01 DIAGNOSIS — M25551 Pain in right hip: Secondary | ICD-10-CM | POA: Diagnosis not present

## 2018-11-01 DIAGNOSIS — M48061 Spinal stenosis, lumbar region without neurogenic claudication: Secondary | ICD-10-CM

## 2018-11-01 NOTE — Progress Notes (Signed)
Safety precautions to be maintained throughout the outpatient stay will include: orient to surroundings, keep bed in low position, maintain call bell within reach at all times, provide assistance with transfer out of bed and ambulation.  

## 2018-11-01 NOTE — Progress Notes (Signed)
Patient's Name: Travis Schultz  MRN: [id number]  Referring Provider: [name], MD  DOB: 02-Jan-1926  PCP: Travis Ruths, MD  DOS: 11/01/2018  Note by: Travis Santa, MD  Service setting: Ambulatory outpatient  Specialty: Interventional Pain Management  Location: [facility] (AMB) Pain Management Facility    Patient type: Established   Primary Reason(s) for Visit: Evaluation of chronic illnesses with exacerbation, or progression (Level of risk: moderate) CC: Back Pain (lower)  HPI  Travis Schultz is a 85 y.o. year old, male patient, who comes today for a follow-up evaluation. He has Lumbar facet arthropathy; Spondylosis without myelopathy or radiculopathy, lumbar region; Lumbar degenerative disc disease; Bilateral primary osteoarthritis of knee; and Chronic pain syndrome on their problem list. Travis Schultz was last seen on 10/18/2018. His primarily concern today is the Back Pain (lower)  Pain Assessment: Location: Right, Left, Lower Back Radiating: denies Onset: More than a month ago Duration:   Quality: Aching Severity: 5 /10 (subjective, self-reported pain score)  Note: Reported level is compatible with observation.                         When using our objective Pain Scale, levels between 6 and 10/10 are said to belong in an emergency room, as it progressively worsens from a 6/10, described as severely limiting, requiring emergency care not usually available at an outpatient pain management facility. At a 6/10 level, communication becomes difficult and requires great effort. Assistance to reach the emergency department may be required. Facial flushing and profuse sweating along with potentially dangerous increases in heart rate and blood pressure will be evident. Effect on ADL:   Timing: Intermittent Modifying factors: sitting BP: 129/61  HR: 74  Further details on both, my assessment(s), as well as the proposed treatment plan, please see below.  Patient follows up today to  review his x-ray studies.  Continues to have left axial low back pain with weightbearing.  Patient continues to exercise.  Laboratory Chemistry  Inflammation Markers (CRP: Acute Phase) (ESR: Chronic Phase) No results found for: CRP, ESRSEDRATE, LATICACIDVEN                       Rheumatology Markers No results found for: RF, ANA, LABURIC, URICUR, LYMEIGGIGMAB, LYMEABIGMQN, HLAB27                      Renal Function Markers Lab Results  Component Value Date   BUN 22 (H) 03/06/2017   CREATININE 0.99 03/06/2017   GFRAA >60 03/06/2017   GFRNONAA >60 03/06/2017                             Hepatic Function Markers Lab Results  Component Value Date   AST 34 03/06/2017   ALT 24 03/06/2017   ALBUMIN 4.2 03/06/2017   ALKPHOS 75 03/06/2017                        Electrolytes Lab Results  Component Value Date   NA 140 03/06/2017   K 4.3 03/06/2017   CL 107 03/06/2017   CALCIUM 9.3 03/06/2017                        Neuropathy Markers No results found for: VITAMINB12, FOLATE, HGBA1C, HIV  CNS Tests No results found for: COLORCSF, APPEARCSF, RBCCOUNTCSF, WBCCSF, POLYSCSF, LYMPHSCSF, EOSCSF, PROTEINCSF, GLUCCSF, JCVIRUS, CSFOLI, IGGCSF                      Bone Pathology Markers No results found for: VD25OH, [id number], [id number], [id number], 25OHVITD1, 25OHVITD2, 25OHVITD3, TESTOFREE, TESTOSTERONE                       Coagulation Parameters Lab Results  Component Value Date   PLT 193 03/06/2017                        Cardiovascular Markers Lab Results  Component Value Date   TROPONINI <0.03 03/06/2017   HGB 13.6 03/06/2017   HCT 38.3 (L) 03/06/2017                         CA Markers No results found for: CEA, CA125, LABCA2                      Note: Lab results reviewed.  Recent Diagnostic Imaging Review   Lumbosacral Imaging: Lumbar MR wo contrast:  Results for orders placed during the hospital encounter of 01/12/17  MR LUMBAR SPINE WO  CONTRAST   Results for orders placed during the hospital encounter of 10/18/18  DG Si Joints   Narrative CLINICAL DATA:  Sacral pain, no known injury, initial encounter  EXAM: BILATERAL SACROILIAC JOINTS - 3+ VIEW  COMPARISON:  None.  FINDINGS: Sacroiliac joints are widely patent bilaterally. No acute fracture is seen. The ala are within normal limits.  IMPRESSION: No acute abnormality noted.   Electronically Signed   By: Travis Schultz M.D.   On: 10/18/2018 16:12     Results for orders placed during the hospital encounter of 10/18/18  DG HIP UNILAT W OR W/O PELVIS 2-3 VIEWS RIGHT   Narrative CLINICAL DATA:  Chronic pain  EXAM: DG HIP (WITH OR WITHOUT PELVIS) 2-3V RIGHT  COMPARISON:  Left hip images performed today.  FINDINGS: Mild joint space narrowing and spurring in the hip joints bilaterally, symmetric. SI joints symmetric and unremarkable. No acute bony abnormality. Specifically, no fracture, subluxation, or dislocation. Surgical clips in the pelvis. Presumed prior prostatectomy and lymph node dissection.  IMPRESSION: Mild symmetric degenerative changes in the hips. No acute bony abnormality.   Electronically Signed   By: Travis Schultz M.D.   On: 10/18/2018 16:14    Hip-L DG 2-3 views:  Results for orders placed during the hospital encounter of 10/18/18  DG HIP UNILAT W OR W/O PELVIS 2-3 VIEWS LEFT   Narrative CLINICAL DATA:  Chronic pain.  EXAM: DG HIP (WITH OR WITHOUT PELVIS) 2-3V LEFT  COMPARISON:  Right hip images performed today.  FINDINGS: Mild joint space narrowing and spurring in the hip joints bilaterally, symmetric. SI joints symmetric and unremarkable. No acute bony abnormality. Specifically, no fracture, subluxation, or dislocation. Surgical clips in the pelvis. Presumed prior prostatectomy and lymph node dissection.  IMPRESSION: Mild symmetric degenerative changes in the hips. No acute bony abnormality.   Electronically Signed    By: Travis Schultz M.D.   On: 10/18/2018 16:14     Complexity Note: Imaging results reviewed. Results shared with Travis Schultz, using Layman's terms.                         Meds   Current  Outpatient Medications:  .  aspirin 81 MG tablet, Take 81 mg by mouth daily., Disp: , Rfl:  .  docusate sodium (COLACE) 100 MG capsule, Take 1 capsule by mouth as needed., Disp: , Rfl:  .  Multiple Vitamins-Minerals (CENTRUM SILVER 50+MEN PO), Take 1 tablet by mouth daily., Disp: , Rfl:  .  Multiple Vitamins-Minerals (ICAPS AREDS 2) CAPS, Take 1 capsule by mouth 2 (two) times daily., Disp: , Rfl:  .  multivitamin-iron-minerals-folic acid (CENTRUM) chewable tablet, Chew 1 tablet by mouth daily., Disp: , Rfl:  .  naproxen sodium (ALEVE) 220 MG tablet, Take 220 mg by mouth., Disp: , Rfl:  .  omeprazole (PRILOSEC) 10 MG capsule, Take 10 mg by mouth daily., Disp: , Rfl:  .  pravastatin (PRAVACHOL) 40 MG tablet, Take 40 mg by mouth daily., Disp: , Rfl:   ROS  Constitutional: Denies any fever or chills Gastrointestinal: No reported hemesis, hematochezia, vomiting, or acute GI distress Musculoskeletal: Denies any acute onset joint swelling, redness, loss of ROM, or weakness Neurological: No reported episodes of acute onset apraxia, aphasia, dysarthria, agnosia, amnesia, paralysis, loss of coordination, or loss of consciousness  Allergies  Mr. Stickles is allergic to phenergan [promethazine hcl].  PFSH  Drug: Mr. Shatto  reports no history of drug use. Alcohol:  reports no history of alcohol use. Tobacco:  reports that he has never smoked. He has never used smokeless tobacco. Medical:  has a past medical history of Lymphoma ([place]) (1991) and Myocardial infarction ([place]) (06/17/2009). Surgical: Mr. Pape  has a past surgical history that includes Rotator cuff repair (Right, 2008); Eye surgery (Bilateral, 2012); Carotid angioplasty (N/A, 02/24/2010); Cataract extraction (Bilateral, 2012); and Back  surgery. Family: family history includes Heart disease in his brother and father.  Constitutional Exam  General appearance: Well nourished, well developed, and well hydrated. In no apparent acute distress Vitals:   11/01/18 0819  BP: 129/61  Pulse: 74  Resp: 18  Temp: 97.6 F (36.4 C)  TempSrc: Oral  SpO2: 100%  Weight: 190 lb (86.2 kg)  Height: '5\' 11"'  (1.803 m)   BMI Assessment: Estimated body mass index is 26.5 kg/m as calculated from the following:   Height as of this encounter: '5\' 11"'  (1.803 m).   Weight as of this encounter: 190 lb (86.2 kg).  BMI interpretation table: BMI level Category Range association with higher incidence of chronic pain  <18 kg/m2 Underweight   18.5-24.9 kg/m2 Ideal body weight   25-29.9 kg/m2 Overweight Increased incidence by 20%  30-34.9 kg/m2 Obese (Class I) Increased incidence by 68%  35-39.9 kg/m2 Severe obesity (Class II) Increased incidence by 136%  >40 kg/m2 Extreme obesity (Class III) Increased incidence by 254%   Patient's current BMI Ideal Body weight  Body mass index is 26.5 kg/m. Ideal body weight: 75.3 kg (166 lb 0.1 oz) Adjusted ideal body weight: 79.7 kg (175 lb 9.7 oz)   BMI Readings from Last 4 Encounters:  11/01/18 26.50 kg/m  10/18/18 26.36 kg/m  09/10/18 25.70 kg/m  08/06/18 25.09 kg/m   Wt Readings from Last 4 Encounters:  11/01/18 190 lb (86.2 kg)  10/18/18 189 lb (85.7 kg)  09/10/18 189 lb 8 oz (86 kg)  08/06/18 185 lb (83.9 kg)  Psych/Mental status: Alert, oriented x 3 (person, place, & time)       Eyes: PERLA Respiratory: No evidence of acute respiratory distress  Cervical Spine Area Exam  Skin & Axial Inspection: No masses, redness, edema, swelling, or associated skin lesions  Alignment: Symmetrical Functional ROM: Unrestricted ROM      Stability: No instability detected Muscle Tone/Strength: Functionally intact. No obvious neuro-muscular anomalies detected. Sensory (Neurological):  Unimpaired Palpation: No palpable anomalies              Upper Extremity (UE) Exam    Side: Right upper extremity  Side: Left upper extremity  Skin & Extremity Inspection: Skin color, temperature, and hair growth are WNL. No peripheral edema or cyanosis. No masses, redness, swelling, asymmetry, or associated skin lesions. No contractures.  Skin & Extremity Inspection: Skin color, temperature, and hair growth are WNL. No peripheral edema or cyanosis. No masses, redness, swelling, asymmetry, or associated skin lesions. No contractures.  Functional ROM: Unrestricted ROM          Functional ROM: Unrestricted ROM          Muscle Tone/Strength: Functionally intact. No obvious neuro-muscular anomalies detected.  Muscle Tone/Strength: Functionally intact. No obvious neuro-muscular anomalies detected.  Sensory (Neurological): Unimpaired          Sensory (Neurological): Unimpaired          Palpation: No palpable anomalies              Palpation: No palpable anomalies              Provocative Test(s):  Phalen's test: deferred Tinel's test: deferred Apley's scratch test (touch opposite shoulder):  Action 1 (Across chest): deferred Action 2 (Overhead): deferred Action 3 (LB reach): deferred   Provocative Test(s):  Phalen's test: deferred Tinel's test: deferred Apley's scratch test (touch opposite shoulder):  Action 1 (Across chest): deferred Action 2 (Overhead): deferred Action 3 (LB reach): deferred    Thoracic Spine Area Exam  Skin & Axial Inspection: No masses, redness, or swelling Alignment: Symmetrical Functional ROM: Unrestricted ROM Stability: No instability detected Muscle Tone/Strength: Functionally intact. No obvious neuro-muscular anomalies detected. Sensory (Neurological): Unimpaired Muscle strength & Tone: No palpable anomalies  Lumbar Spine Area Exam  Skin & Axial Inspection: No masses, redness, or swelling Alignment: Symmetrical Functional ROM: Unrestricted ROM        Stability: No instability detected Muscle Tone/Strength: Functionally intact. No obvious neuro-muscular anomalies detected. Sensory (Neurological): Musculoskeletal pain pattern with weight bearing Palpation: No palpable anomalies       Provocative Tests: Hyperextension/rotation test: Improved after treatment       Lumbar quadrant test (Kemp's test): deferred today       Lateral bending test: deferred today       Patrick's Maneuver: deferred today                   FABER* test: deferred today                   S-I anterior distraction/compression test: deferred today         S-I lateral compression test: deferred today         S-I Thigh-thrust test: deferred today         S-I Gaenslen's test: deferred today         *(Flexion, ABduction and External Rotation)  Gait & Posture Assessment  Ambulation: Unassisted Gait: Relatively normal for age and body habitus Posture: WNL   Lower Extremity Exam    Side: Right lower extremity  Side: Left lower extremity  Stability: No instability observed          Stability: No instability observed          Skin & Extremity Inspection: Skin color, temperature,  and hair growth are WNL. No peripheral edema or cyanosis. No masses, redness, swelling, asymmetry, or associated skin lesions. No contractures.  Skin & Extremity Inspection: Skin color, temperature, and hair growth are WNL. No peripheral edema or cyanosis. No masses, redness, swelling, asymmetry, or associated skin lesions. No contractures.  Functional ROM: Unrestricted ROM                  Functional ROM: Decreased ROM for hip joint          Muscle Tone/Strength: Functionally intact. No obvious neuro-muscular anomalies detected.  Muscle Tone/Strength: Functionally intact. No obvious neuro-muscular anomalies detected.  Sensory (Neurological): Unimpaired        Sensory (Neurological): Unimpaired        DTR: Patellar: deferred today Achilles: deferred today Plantar: deferred today  DTR: Patellar:  deferred today Achilles: deferred today Plantar: deferred today  Palpation: No palpable anomalies  Palpation: No palpable anomalies   Assessment  Primary Diagnosis & Pertinent Problem List: The primary encounter diagnosis was Bilateral hip pain. Diagnoses of SI joint arthritis, Lumbar facet arthropathy, Lumbar degenerative disc disease, and Lumbar foraminal stenosis were also pertinent to this visit.  Status Diagnosis  Persistent Improving Controlled 1. Bilateral hip pain   2. SI joint arthritis   3. Lumbar facet arthropathy   4. Lumbar degenerative disc disease   5. Lumbar foraminal stenosis      Had instance of discussion with the patient about future options.  Patient is status post right and left lumbar radiofrequency ablation which he states overall has helped his pain.  Patient continues to have axial low back pain primarily with walking.  He continues to exercise daily every day.  I encouraged him to continue this.  Patient does have moderate degenerative hip disease right greater than left.  We did discuss diagnostic hip steroid injections but decided to hold off.  Patient is welcome to follow-up with me in the future to discuss any new pain complaints that he has or to repeat lumbar radiofrequency ablation.  Patient endorsed understanding.  Time Note: Greater than 50% of the 15 minute(s) of face-to-face time spent with Mr. Porche, was spent in counseling/coordination of care regarding: Mr. Lachapelle primary cause of pain, the treatment plan, treatment alternatives, the results, interpretation and significance of  his recent diagnostic interventional treatment(s) and realistic expectations.  Provider-requested follow-up: Return if symptoms worsen or fail to improve.  No future appointments.  Primary Care Physician: Travis Ruths, MD Location: [facility] Note by: Travis Schultz, M.D Date: 11/01/2018; Time: 10:10 AM  There are no Patient  Instructions on file for this visit.

## 2019-02-12 ENCOUNTER — Other Ambulatory Visit: Payer: Self-pay | Admitting: Surgery

## 2019-02-12 DIAGNOSIS — M51369 Other intervertebral disc degeneration, lumbar region without mention of lumbar back pain or lower extremity pain: Secondary | ICD-10-CM

## 2019-02-12 DIAGNOSIS — M5136 Other intervertebral disc degeneration, lumbar region: Secondary | ICD-10-CM

## 2019-02-12 DIAGNOSIS — M4807 Spinal stenosis, lumbosacral region: Secondary | ICD-10-CM

## 2019-02-20 ENCOUNTER — Ambulatory Visit
Admission: RE | Admit: 2019-02-20 | Discharge: 2019-02-20 | Disposition: A | Discharge status: Home / Self Care | Payer: Medicare PPO | Source: Ambulatory Visit | Attending: Surgery | Admitting: Surgery

## 2019-02-20 ENCOUNTER — Other Ambulatory Visit: Payer: Self-pay

## 2019-02-20 DIAGNOSIS — M4807 Spinal stenosis, lumbosacral region: Secondary | ICD-10-CM | POA: Insufficient documentation

## 2019-02-20 DIAGNOSIS — M5136 Other intervertebral disc degeneration, lumbar region: Secondary | ICD-10-CM | POA: Diagnosis present

## 2020-01-16 ENCOUNTER — Ambulatory Visit: Payer: Medicare PPO | Admitting: Dermatology

## 2020-01-16 ENCOUNTER — Encounter: Payer: Self-pay | Admitting: Dermatology

## 2020-01-16 ENCOUNTER — Other Ambulatory Visit: Payer: Self-pay

## 2020-01-16 DIAGNOSIS — Z1283 Encounter for screening for malignant neoplasm of skin: Secondary | ICD-10-CM

## 2020-01-16 DIAGNOSIS — L578 Other skin changes due to chronic exposure to nonionizing radiation: Secondary | ICD-10-CM | POA: Diagnosis not present

## 2020-01-16 DIAGNOSIS — L570 Actinic keratosis: Secondary | ICD-10-CM | POA: Diagnosis not present

## 2020-01-16 DIAGNOSIS — D225 Melanocytic nevi of trunk: Secondary | ICD-10-CM

## 2020-01-16 DIAGNOSIS — L821 Other seborrheic keratosis: Secondary | ICD-10-CM | POA: Diagnosis not present

## 2020-01-16 DIAGNOSIS — D229 Melanocytic nevi, unspecified: Secondary | ICD-10-CM

## 2020-01-16 DIAGNOSIS — L814 Other melanin hyperpigmentation: Secondary | ICD-10-CM

## 2020-01-16 DIAGNOSIS — D692 Other nonthrombocytopenic purpura: Secondary | ICD-10-CM

## 2020-01-16 NOTE — Progress Notes (Signed)
   Follow-Up Visit   Subjective  Travis Schultz is a 86 y.o. male who presents for the following: Follow-up (AKs on right ear and right nose.). Patient states no new or changing spots noticed today. An upper body skin exam was performed for skin cancer screening and mole check.  The following portions of the chart were reviewed this encounter and updated as appropriate: Tobacco  Allergies  Meds  Problems  Med Hx  Surg Hx  Fam Hx      Review of Systems: No other skin or systemic complaints.  Objective  Well appearing patient in no apparent distress; mood and affect are within normal limits.  All skin waist up examined.  Objective  Face and Ears x 6 (6): Erythematous thin papules/macules with gritty scale.   Objective  Right Medial Clavicle: Flesh papule.  Assessment & Plan  AK (actinic keratosis) (6) Face and Ears x 6  Destruction of lesion - Face and Ears x 6 Complexity: simple   Destruction method: cryotherapy   Informed consent: discussed and consent obtained   Timeout:  patient name, date of birth, surgical site, and procedure verified Lesion destroyed using liquid nitrogen: Yes   Region frozen until ice ball extended beyond lesion: Yes   Outcome: patient tolerated procedure well with no complications   Post-procedure details: wound care instructions given    Nevus Right Medial Clavicle  Benign-appearing.  Observation.  Call clinic for new or changing moles.  Recommend daily use of broad spectrum spf 30+ sunscreen to sun-exposed areas.    Skin cancer screening  Actinic Damage - diffuse scaly erythematous macules with underlying dyspigmentation - Recommend daily broad spectrum sunscreen SPF 30+ to sun-exposed areas, reapply every 2 hours as needed.  - Call for new or changing lesions. Seborrheic Keratoses - Stuck-on, waxy, tan-brown papules and plaques  - Discussed benign etiology and prognosis. - Observe - Call for any changes  Lentigines -  Scattered tan macules - Discussed due to sun exposure - Benign, observe - Call for any changes Purpura - Violaceous macules and patches - Benign - Related to age, sun damage and/or use of blood thinners - Observe - Can use OTC arnica containing moisturizer such as Dermend Bruise Formula if desired - Call for worsening or other concerns     Return in about 6 months (around 07/17/2020) for TBSE.   Lindi Adie, CMA, am acting as scribe for Sarina Ser, MD . Documentation: I have reviewed the above documentation for accuracy and completeness, and I agree with the above.  Sarina Ser, MD

## 2020-07-16 ENCOUNTER — Ambulatory Visit: Payer: Medicare PPO | Admitting: Dermatology

## 2021-01-04 ENCOUNTER — Emergency Department: Payer: Medicare PPO

## 2021-01-04 ENCOUNTER — Observation Stay
Admission: EM | Admit: 2021-01-04 | Discharge: 2021-01-06 | Disposition: A | Discharge status: Home / Self Care | Payer: Medicare PPO | Attending: Internal Medicine | Admitting: Internal Medicine

## 2021-01-04 ENCOUNTER — Other Ambulatory Visit: Payer: Self-pay

## 2021-01-04 DIAGNOSIS — Z79899 Other long term (current) drug therapy: Secondary | ICD-10-CM | POA: Insufficient documentation

## 2021-01-04 DIAGNOSIS — Z20822 Contact with and (suspected) exposure to covid-19: Secondary | ICD-10-CM | POA: Diagnosis not present

## 2021-01-04 DIAGNOSIS — K5900 Constipation, unspecified: Secondary | ICD-10-CM | POA: Diagnosis not present

## 2021-01-04 DIAGNOSIS — R1013 Epigastric pain: Principal | ICD-10-CM | POA: Insufficient documentation

## 2021-01-04 DIAGNOSIS — R079 Chest pain, unspecified: Secondary | ICD-10-CM | POA: Diagnosis present

## 2021-01-04 DIAGNOSIS — R0789 Other chest pain: Secondary | ICD-10-CM

## 2021-01-04 DIAGNOSIS — R1010 Upper abdominal pain, unspecified: Secondary | ICD-10-CM | POA: Diagnosis not present

## 2021-01-04 DIAGNOSIS — Z7982 Long term (current) use of aspirin: Secondary | ICD-10-CM | POA: Diagnosis not present

## 2021-01-04 DIAGNOSIS — R142 Eructation: Secondary | ICD-10-CM

## 2021-01-04 DIAGNOSIS — R109 Unspecified abdominal pain: Secondary | ICD-10-CM | POA: Diagnosis present

## 2021-01-04 DIAGNOSIS — R198 Other specified symptoms and signs involving the digestive system and abdomen: Secondary | ICD-10-CM

## 2021-01-04 DIAGNOSIS — Z951 Presence of aortocoronary bypass graft: Secondary | ICD-10-CM | POA: Insufficient documentation

## 2021-01-04 DIAGNOSIS — I2510 Atherosclerotic heart disease of native coronary artery without angina pectoris: Secondary | ICD-10-CM | POA: Diagnosis not present

## 2021-01-04 DIAGNOSIS — K30 Functional dyspepsia: Secondary | ICD-10-CM

## 2021-01-04 DIAGNOSIS — R0989 Other specified symptoms and signs involving the circulatory and respiratory systems: Secondary | ICD-10-CM

## 2021-01-04 DIAGNOSIS — I25119 Atherosclerotic heart disease of native coronary artery with unspecified angina pectoris: Secondary | ICD-10-CM | POA: Diagnosis not present

## 2021-01-04 DIAGNOSIS — C8590 Non-Hodgkin lymphoma, unspecified, unspecified site: Secondary | ICD-10-CM | POA: Diagnosis present

## 2021-01-04 LAB — URINALYSIS, COMPLETE (UACMP) WITH MICROSCOPIC
Bacteria, UA: NONE SEEN
Bilirubin Urine: NEGATIVE
Glucose, UA: NEGATIVE mg/dL
Hgb urine dipstick: NEGATIVE
Ketones, ur: NEGATIVE mg/dL
Leukocytes,Ua: NEGATIVE
Nitrite: NEGATIVE
Protein, ur: NEGATIVE mg/dL
Specific Gravity, Urine: >1.046 — ABNORMAL HIGH (ref 1.005–1.030)
Squamous Epithelial / HPF: NONE SEEN (ref 0–5)
WBC, UA: NONE SEEN WBC/hpf (ref 0–5)
pH: 5 (ref 5.0–8.0)

## 2021-01-04 LAB — CBC
HCT: 40.2 % (ref 39.0–52.0)
Hemoglobin: 13.4 g/dL (ref 13.0–17.0)
MCH: 32.6 pg (ref 26.0–34.0)
MCHC: 33.3 g/dL (ref 30.0–36.0)
MCV: 97.8 fL (ref 80.0–100.0)
Platelets: 198 10*3/uL (ref 150–400)
RBC: 4.11 MIL/uL — ABNORMAL LOW (ref 4.22–5.81)
RDW: 12.8 % (ref 11.5–15.5)
WBC: 17 10*3/uL — ABNORMAL HIGH (ref 4.0–10.5)
nRBC: 0 % (ref 0.0–0.2)

## 2021-01-04 LAB — BASIC METABOLIC PANEL
Anion gap: 5 (ref 5–15)
BUN: 31 mg/dL — ABNORMAL HIGH (ref 8–23)
CO2: 26 mmol/L (ref 22–32)
Calcium: 8.9 mg/dL (ref 8.9–10.3)
Chloride: 106 mmol/L (ref 98–111)
Creatinine, Ser: 1.21 mg/dL (ref 0.61–1.24)
GFR, Estimated: 56 mL/min — ABNORMAL LOW (ref >60)
Glucose, Bld: 110 mg/dL — ABNORMAL HIGH (ref 70–99)
Potassium: 4.7 mmol/L (ref 3.5–5.1)
Sodium: 137 mmol/L (ref 135–145)

## 2021-01-04 LAB — TROPONIN I (HIGH SENSITIVITY)
Troponin I (High Sensitivity): 12 ng/L (ref <18)
Troponin I (High Sensitivity): 12 ng/L (ref <18)
Troponin I (High Sensitivity): 11 ng/L (ref <18)

## 2021-01-04 LAB — SARS CORONAVIRUS 2 (TAT 6-24 HRS): SARS Coronavirus 2: NEGATIVE

## 2021-01-04 MED ORDER — ASPIRIN 81 MG PO CHEW
81.0000 mg | CHEWABLE_TABLET | Freq: Every day | ORAL | Status: DC
  Administered 2021-01-04 – 2021-01-06 (×3): 81 mg via ORAL
  Filled 2021-01-04 (×6): qty 1

## 2021-01-04 MED ORDER — OCUVITE-LUTEIN PO CAPS
1.0000 | ORAL_CAPSULE | Freq: Every day | ORAL | Status: DC
  Administered 2021-01-05 – 2021-01-06 (×2): 1 via ORAL
  Filled 2021-01-04 (×4): qty 1

## 2021-01-04 MED ORDER — SODIUM CHLORIDE 0.9% FLUSH: 3.0000 mL | INTRAVENOUS | Status: DC | PRN

## 2021-01-04 MED ORDER — ICAPS AREDS 2 PO CAPS: 1.0000 | ORAL_CAPSULE | Freq: Two times a day (BID) | ORAL | Status: DC

## 2021-01-04 MED ORDER — APOAEQUORIN 10 MG PO CAPS: 1.0000 | ORAL_CAPSULE | Freq: Every day | ORAL | Status: DC

## 2021-01-04 MED ORDER — ADULT MULTIVITAMIN W/MINERALS CH
1.0000 | ORAL_TABLET | Freq: Every day | ORAL | Status: DC
  Administered 2021-01-05 – 2021-01-06 (×2): 1 via ORAL
  Filled 2021-01-04 (×2): qty 1

## 2021-01-04 MED ORDER — ONDANSETRON HCL 4 MG PO TABS: 4.0000 mg | ORAL_TABLET | Freq: Four times a day (QID) | ORAL | Status: DC | PRN

## 2021-01-04 MED ORDER — ONDANSETRON HCL 4 MG/2ML IJ SOLN: 4.0000 mg | Freq: Four times a day (QID) | INTRAMUSCULAR | Status: DC | PRN

## 2021-01-04 MED ORDER — POLYETHYLENE GLYCOL 3350 17 G PO PACK
17.0000 g | PACK | Freq: Every day | ORAL | Status: DC
  Administered 2021-01-04: 17 g via ORAL
  Filled 2021-01-04: qty 1

## 2021-01-04 MED ORDER — PRAVASTATIN SODIUM 40 MG PO TABS
40.0000 mg | ORAL_TABLET | Freq: Every day | ORAL | Status: DC
  Administered 2021-01-04 – 2021-01-05 (×2): 40 mg via ORAL
  Filled 2021-01-04 (×2): qty 1
  Filled 2021-01-04 (×2): qty 2
  Filled 2021-01-04: qty 1

## 2021-01-04 MED ORDER — CENTRUM PO CHEW: 1.0000 | CHEWABLE_TABLET | Freq: Every day | ORAL | Status: DC

## 2021-01-04 MED ORDER — CENTRUM SILVER 50+MEN PO TABS: ORAL_TABLET | Freq: Every day | ORAL | Status: DC

## 2021-01-04 MED ORDER — PANTOPRAZOLE SODIUM 40 MG PO TBEC
40.0000 mg | DELAYED_RELEASE_TABLET | Freq: Every day | ORAL | Status: DC
  Administered 2021-01-04 – 2021-01-06 (×3): 40 mg via ORAL
  Filled 2021-01-04 (×3): qty 1

## 2021-01-04 MED ORDER — SODIUM CHLORIDE 0.9% FLUSH
3.0000 mL | Freq: Two times a day (BID) | INTRAVENOUS | Status: DC
  Administered 2021-01-04 – 2021-01-06 (×5): 3 mL via INTRAVENOUS

## 2021-01-04 MED ORDER — ENOXAPARIN SODIUM 40 MG/0.4ML ~~LOC~~ SOLN
40.0000 mg | SUBCUTANEOUS | Status: DC
  Administered 2021-01-04 – 2021-01-05 (×2): 40 mg via SUBCUTANEOUS
  Filled 2021-01-04 (×2): qty 0.4

## 2021-01-04 MED ORDER — IOHEXOL 300 MG/ML  SOLN
100.0000 mL | Freq: Once | INTRAMUSCULAR | Status: AC | PRN
  Administered 2021-01-04: 100 mL via INTRAVENOUS

## 2021-01-04 MED ORDER — ALUM & MAG HYDROXIDE-SIMETH 200-200-20 MG/5ML PO SUSP
30.0000 mL | ORAL | Status: DC | PRN
  Administered 2021-01-04 – 2021-01-05 (×2): 30 mL via ORAL
  Filled 2021-01-04 (×2): qty 30

## 2021-01-04 MED ORDER — SODIUM CHLORIDE 0.9 % IV SOLN: 250.0000 mL | INTRAVENOUS | Status: DC | PRN

## 2021-01-04 MED ORDER — BISACODYL 10 MG RE SUPP
10.0000 mg | Freq: Once | RECTAL | Status: DC
  Filled 2021-01-04: qty 1

## 2021-01-04 NOTE — Progress Notes (Signed)
Patient tolerating clear liquid diet, but only the cold items. The broth and other items that are hot inflict pain and discomfort per patient. Encouraged patient to avoid the hot broths and liquids. Brought patient additional jello cups and italian ice.   Fuller Mandril, RN

## 2021-01-04 NOTE — ED Notes (Signed)
Pt to CT

## 2021-01-04 NOTE — ED Notes (Signed)
Admitting provider at bedside.

## 2021-01-04 NOTE — ED Notes (Signed)
Dr. Kinner at the bedside for pt evaluation.  

## 2021-01-04 NOTE — Progress Notes (Signed)
Spoke with patient's fiance Mardene Celeste. Update given over phone.   Fuller Mandril, RN

## 2021-01-04 NOTE — H&P (Signed)
History and Physical    Travis Schultz ASN:[id number] DOB: [birth year] DOA: 01/04/2021  PCP: [name], MD   Patient coming from: Home  I have personally briefly reviewed patient's old medical records in [place]  Chief Complaint: Abdominal pain/chest pain  HPI: Travis Schultz is a 87 y.o. male with medical history significant for coronary artery disease status post CABG and history of lymphoma who presents to the ER for evaluation of abdominal pain.  Patient states that his symptoms started on Friday evening after dinner, he had a large meal for dinner and subsequently developed abdominal discomfort.  He thought he had indigestion and continued to belch all through the night. The next day he developed pain in the epigastrium and lower sternal area associated with dry heaves.  There was no radiation of the pain, no diaphoresis or palpitations.  The pain has resolved. Over the last 24 hours, he has had abdominal pain mostly in the periumbilical area and lower quadrants and just feels uncomfortable. Patient also gives a history of chronic constipation and on Friday had to use a stool softener because his bowel movements were hard.  He had a bowel movement on the morning of his presentation but feels very bloated. He denies having any fever or chills, no dizziness, no lightheadedness, no headache, no cough, no urinary frequency, no nocturia, no dysuria, no blurred vision or headache. Labs show sodium 137, potassium 4.7, chloride 106, bicarb 26, glucose 110, BUN 31, creatinine 1.21, calcium 8.9, troponin XII, white count 17, hemoglobin 13.4, hematocrit 40.2, MCV 97, RDW 12.8, platelet count 198 Urinalysis is sterile CT scan of abdomen and pelvis shows sigmoid diverticulosis without evidence of acute diverticulitis.Generally large burden of stool throughout the colon and rectum. Status post prostatectomy. Small hiatal hernia. Chest x-ray reviewed by me shows no acute  findings Twelve-lead EKG reviewed by me shows sinus rhythm, left axis deviation, RBBB ED Course: Patient is a 87 year old male who presents to the ER for evaluation of abdominal pain and chest pain for about 2 days.  He does have a history of coronary artery disease status post CABG.   His pain was from indigestion but has continued to have abdominal discomfort.  Imaging of the abdomen shows constipation and no other acute findings.  Patient also has a white count of 17,000. He will be referred to observation status for further evaluation.     Review of Systems: As per HPI otherwise all other systems reviewed and negative.    Past Medical History:  Diagnosis Date  . Actinic keratosis   . Lymphoma ([place]) 1991  . Myocardial infarction ([place]) 06/17/2009   5 bypass's    Past Surgical History:  Procedure Laterality Date  . BACK SURGERY     after injury to back   . CARDIAC SURGERY    . CAROTID ARTERY ANGIOPLASTY N/A 02/24/2010  . CATARACT EXTRACTION Bilateral 2012  . EYE SURGERY Bilateral 2012  . ROTATOR CUFF REPAIR Right 2008     reports that he has never smoked. He has never used smokeless tobacco. He reports that he does not drink alcohol and does not use drugs.  Allergies  Allergen Reactions  . Phenergan [Promethazine Hcl]     Family History  Problem Relation Age of Onset  . Heart disease Father   . Heart disease Brother       Prior to Admission medications   Medication Sig Start Date End Date Taking? Authorizing Provider  Apoaequorin (PREVAGEN) 10 MG  CAPS Take 1 capsule by mouth daily.    [provider]  aspirin 81 MG tablet Take 81 mg by mouth daily.    [provider]  docusate sodium (COLACE) 100 MG capsule Take 1 capsule by mouth as needed.    [provider]  Multiple Vitamins-Minerals (CENTRUM SILVER 50+MEN PO) Take 1 tablet by mouth daily.    [provider]  Multiple Vitamins-Minerals (ICAPS AREDS 2) CAPS Take 1 capsule by  mouth 2 (two) times daily.    [provider]  multivitamin-iron-minerals-folic acid (CENTRUM) chewable tablet Chew 1 tablet by mouth daily.    [provider]  naproxen sodium (ALEVE) 220 MG tablet Take 220 mg by mouth.    [provider]  omeprazole (PRILOSEC) 10 MG capsule Take 10 mg by mouth daily.    [provider]  pravastatin (PRAVACHOL) 40 MG tablet Take 40 mg by mouth daily. 12/11/17   [provider]    Physical Exam: Vitals:   01/04/21 0911 01/04/21 0930 01/04/21 1000 01/04/21 1100  BP: (!) 148/69 (!) 155/68 (!) 152/70 (!) 159/62  Pulse: 70 75 72 66  Resp: 14 15 (!) 23 14  Temp:      TempSrc:      SpO2: 100% 98% 95% 98%  Weight:      Height:         Vitals:   01/04/21 0911 01/04/21 0930 01/04/21 1000 01/04/21 1100  BP: (!) 148/69 (!) 155/68 (!) 152/70 (!) 159/62  Pulse: 70 75 72 66  Resp: 14 15 (!) 23 14  Temp:      TempSrc:      SpO2: 100% 98% 95% 98%  Weight:      Height:          Constitutional: Alert and oriented x 3. Not in any apparent distress.  Looks younger than stated age 15:      Head: Normocephalic and atraumatic.         Eyes: PERLA, EOMI, Conjunctivae are normal. Sclera is non-icteric.       Mouth/Throat: Mucous membranes are moist.       Neck: Supple with no signs of meningismus. Cardiovascular: Regular rate and rhythm. No murmurs, gallops, or rubs. 2+ symmetrical distal pulses are present . No JVD. No LE edema Respiratory: Respiratory effort normal .Lungs sounds clear bilaterally. No wheezes, crackles, or rhonchi.  Gastrointestinal: Soft, diffuse tenderness, and non distended with positive bowel sounds.  Genitourinary: No CVA tenderness. Musculoskeletal: Nontender with normal range of motion in all extremities. No cyanosis, or erythema of extremities. Neurologic:  Face is symmetric. Moving all extremities. No gross focal neurologic deficits . Skin: Skin is warm, dry.  No rash or  ulcers Psychiatric: Mood and affect are normal   Labs on Admission: I have personally reviewed following labs and imaging studies  CBC: Recent Labs  Lab 01/04/21 0919  WBC 17.0*  HGB 13.4  HCT 40.2  MCV 97.8  PLT 009   Basic Metabolic Panel: Recent Labs  Lab 01/04/21 0919  NA 137  K 4.7  CL 106  CO2 26  GLUCOSE 110*  BUN 31*  CREATININE 1.21  CALCIUM 8.9   GFR: Estimated Creatinine Clearance: 42.8 mL/min (by C-G formula based on SCr of 1.21 mg/dL). Liver Function Tests: No results for input(s): AST, ALT, ALKPHOS, BILITOT, PROT, ALBUMIN in the last 168 hours. No results for input(s): LIPASE, AMYLASE in the last 168 hours. No results for input(s): AMMONIA in the last 168  hours. Coagulation Profile: No results for input(s): INR, PROTIME in the last 168 hours. Cardiac Enzymes: No results for input(s): CKTOTAL, CKMB, CKMBINDEX, TROPONINI in the last 168 hours. BNP (last 3 results) No results for input(s): PROBNP in the last 8760 hours. HbA1C: No results for input(s): HGBA1C in the last 72 hours. CBG: No results for input(s): GLUCAP in the last 168 hours. Lipid Profile: No results for input(s): CHOL, HDL, LDLCALC, TRIG, CHOLHDL, LDLDIRECT in the last 72 hours. Thyroid Function Tests: No results for input(s): TSH, T4TOTAL, FREET4, T3FREE, THYROIDAB in the last 72 hours. Anemia Panel: No results for input(s): VITAMINB12, FOLATE, FERRITIN, TIBC, IRON, RETICCTPCT in the last 72 hours. Urine analysis:    Component Value Date/Time   COLORURINE YELLOW (A) 01/04/2021 1119   APPEARANCEUR CLEAR (A) 01/04/2021 1119   LABSPEC >1.046 (H) 01/04/2021 1119   PHURINE 5.0 01/04/2021 1119   GLUCOSEU NEGATIVE 01/04/2021 1119   HGBUR NEGATIVE 01/04/2021 1119   BILIRUBINUR NEGATIVE 01/04/2021 1119   KETONESUR NEGATIVE 01/04/2021 1119   PROTEINUR NEGATIVE 01/04/2021 1119   NITRITE NEGATIVE 01/04/2021 1119   LEUKOCYTESUR NEGATIVE 01/04/2021 1119    Radiological Exams on  Admission: DG Chest 2 View  Result Date: 01/04/2021 CLINICAL DATA:  87 year old male with intermittent chest pain, epigastric pain for 3 days. EXAM: CHEST - 2 VIEW COMPARISON:  Chest radiographs 02/19/2010 and earlier. FINDINGS: Lung volumes and mediastinal contours are stable and within normal limits. Stable sequelae of CABG. Visualized tracheal air column is within normal limits. Both lungs appear clear. No pneumothorax or pleural effusion. No acute osseous abnormality identified. Negative visible bowel gas pattern. Right upper quadrant cholecystectomy clips. IMPRESSION: Normal for age; prior CABG and cholecystectomy. Electronically Signed   By: Genevie Ann M.D.   On: 01/04/2021 09:20   CT ABDOMEN PELVIS W CONTRAST  Result Date: 01/04/2021 CLINICAL DATA:  Abdominal distension, diverticulitis suspected EXAM: CT ABDOMEN AND PELVIS WITH CONTRAST TECHNIQUE: Multidetector CT imaging of the abdomen and pelvis was performed using the standard protocol following bolus administration of intravenous contrast. CONTRAST:  111mL OMNIPAQUE IOHEXOL 300 MG/ML  SOLN COMPARISON:  01/22/2007 FINDINGS: Lower chest: No acute abnormality.  Small hiatal hernia. Hepatobiliary: No focal liver abnormality is seen. Status post cholecystectomy. No biliary dilatation. Pancreas: Unremarkable. No pancreatic ductal dilatation or surrounding inflammatory changes. Spleen: Normal in size without significant abnormality. Adrenals/Urinary Tract: Adrenal glands are unremarkable. Large parapelvic cyst of the right kidney. Kidneys are otherwise normal, without renal calculi, solid lesion, or hydronephrosis. Bladder is unremarkable. Stomach/Bowel: Stomach is within normal limits. Appendix is not clearly visualized and may be surgically absent. No evidence of bowel wall thickening, distention, or inflammatory changes. Sigmoid diverticulosis. Generally large burden of stool throughout the colon and rectum. Vascular/Lymphatic: Aortic atherosclerosis. No  enlarged abdominal or pelvic lymph nodes. Reproductive: Status post prostatectomy. Other: No abdominal wall hernia or abnormality. No abdominopelvic ascites. Musculoskeletal: No acute or significant osseous findings. IMPRESSION: 1. Sigmoid diverticulosis without evidence of acute diverticulitis. 2. Generally large burden of stool throughout the colon and rectum. 3. Status post prostatectomy. 4. Small hiatal hernia. Aortic Atherosclerosis (ICD10-I70.0). Electronically Signed   By: Eddie Candle M.D.   On: 01/04/2021 10:39     Assessment/Plan Principal Problem:   Abdominal pain Active Problems:   Lymphoma (HCC)   Chest pain   CAD (coronary artery disease)     Abdominal pain Diffuse Probably related to constipation, no acute findings noted on imaging. Trial of soapsuds enema   Chest pain In a patient  with known coronary artery disease status post CABG Patient had a recent stress test which showed indeterminate treadmill EKG due to baseline EKG changes  Mild segmental LV systolic dysfunction  Moderate perfusion abnormality of moderate intensity of the inferior  myocardium consistent with previous infarct and/or scar without evidence  of myocardial ischemia   We will cycle cardiac enzymes Continue aspirin and statins We will request cardiology consult if patient bumps his troponin during this hospitalization, follow-up with cardiology as an outpatient   Leukocytosis Unclear etiology ??  Leukemoid reaction   DVT prophylaxis: Lovenox Code Status: full code Family Communication: Greater than 50% of time was spent discussing plan of care with patient at the bedside.  All questions and concerns have been addressed.  He verbalizes understanding and agrees with plan. Disposition Plan: Back to previous home environment Consults called: none Status: Observation    Handsome Anglin MD [facility]     01/04/2021, 12:41 PM

## 2021-01-04 NOTE — Progress Notes (Signed)
Up to [facility] with water returned from SS enema with small amounts of flakes of feces. Prune juice given. Will continue to monitor enema, Miralax and juice effectiveness. Barbaraann Faster, RN 9:13 PM 01/04/2021

## 2021-01-04 NOTE — ED Provider Notes (Signed)
[facility] Emergency Department Provider Note   ____________________________________________    I have reviewed the triage vital signs and the nursing notes.   HISTORY  Chief Complaint Chest Pain and Abdominal Pain     HPI Travis Schultz is a 87 y.o. male with history as detailed below who presents with complaints of abdominal pain for approximately 2 to 3 days.  Patient reports he has had bloating and abdominal pain which he describes as a 5 out of 10 and cramping in nature over the weekend.  Denies fevers or chills.  Reports he has had bowel movements after taking a suppository.  No history of small bowel obstruction.  Has had cholecystectomy and appendectomy.  He also describes at times he had almost a radiation of pain into his lower chest, no chest pain currently.  Past Medical History:  Diagnosis Date  . Actinic keratosis   . Lymphoma ([place]) 1991  . Myocardial infarction ([place]) 06/17/2009   5 bypass's    Patient Active Problem List   Diagnosis Date Noted  . Abdominal pain 01/04/2021  . Lumbar facet arthropathy 04/16/2018  . Spondylosis without myelopathy or radiculopathy, lumbar region 04/16/2018  . Lumbar degenerative disc disease 04/16/2018  . Bilateral primary osteoarthritis of knee 04/16/2018  . Chronic pain syndrome 04/16/2018  . Lymphoma ([place]) 1991  . Chest pain 1991  . CAD (coronary artery disease) 1991    Past Surgical History:  Procedure Laterality Date  . BACK SURGERY     after injury to back   . CARDIAC SURGERY    . CAROTID ARTERY ANGIOPLASTY N/A 02/24/2010  . CATARACT EXTRACTION Bilateral 2012  . EYE SURGERY Bilateral 2012  . ROTATOR CUFF REPAIR Right 2008    Prior to Admission medications   Medication Sig Start Date End Date Taking? Authorizing Provider  Apoaequorin (PREVAGEN) 10 MG CAPS Take 1 capsule by mouth daily.    [provider]  aspirin 81 MG tablet Take 81 mg by mouth daily.    [provider]  docusate sodium (COLACE) 100 MG capsule Take 1 capsule by mouth as needed.    [provider]  Multiple Vitamins-Minerals (CENTRUM SILVER 50+MEN PO) Take 1 tablet by mouth daily.    [provider]  Multiple Vitamins-Minerals (ICAPS AREDS 2) CAPS Take 1 capsule by mouth 2 (two) times daily.    [provider]  multivitamin-iron-minerals-folic acid (CENTRUM) chewable tablet Chew 1 tablet by mouth daily.    [provider]  naproxen sodium (ALEVE) 220 MG tablet Take 220 mg by mouth.    [provider]  omeprazole (PRILOSEC) 10 MG capsule Take 10 mg by mouth daily.    [provider]  pravastatin (PRAVACHOL) 40 MG tablet Take 40 mg by mouth daily. 12/11/17   [provider]     Allergies Phenergan [promethazine hcl]  Family History  Problem Relation Age of Onset  . Heart disease Father   . Heart disease Brother     Social History Social History   Tobacco Use  . Smoking status: Never Smoker  . Smokeless tobacco: Never Used  Substance Use Topics  . Alcohol use: No  . Drug use: Never    Review of Systems  Constitutional: No fever/chills Eyes: No visual changes.  ENT: No sore throat. Cardiovascular: Denies chest pain. Respiratory: Denies shortness of breath. Gastrointestinal: As above Genitourinary: Negative for dysuria. Musculoskeletal: Negative for back pain. Skin: Negative for rash. Neurological: Negative for headaches or  weakness   ____________________________________________   PHYSICAL EXAM:  VITAL SIGNS: ED Triage Vitals  Enc Vitals Group     BP 01/04/21 0845 120/67     Pulse Rate 01/04/21 0845 85     Resp 01/04/21 0845 16     Temp 01/04/21 0848 97.7 F (36.5 C)     Temp Source 01/04/21 0848 Oral     SpO2 01/04/21 0845 99 %     Weight 01/04/21 0849 80.3 kg (177 lb)     Height 01/04/21 0849 1.829 m (6')     Head Circumference --      Peak Flow --      Pain Score 01/04/21 0849 5      Pain Loc --      Pain Edu? --      Excl. in [place]? --     Constitutional: Alert and oriented.Pleasant and interactive . Nose: No congestion/rhinnorhea. Mouth/Throat: Mucous membranes are moist.   Neck:  Painless ROM Cardiovascular: Normal rate, regular rhythm. Grossly normal heart sounds.  Good peripheral circulation. Respiratory: Normal respiratory effort.  No retractions. Lungs CTAB. Gastrointestinal: Soft, mild distention.,  Mild tenderness in the lower abdomen bilaterally, no CVA tenderness.  Musculoskeletal:   Warm and well perfused Neurologic:  Normal speech and language. No gross focal neurologic deficits are appreciated.  Skin:  Skin is warm, dry and intact. No rash noted. Psychiatric: Mood and affect are normal. Speech and behavior are normal.  ____________________________________________   LABS (all labs ordered are listed, but only abnormal results are displayed)  Labs Reviewed  BASIC METABOLIC PANEL - Abnormal; Notable for the following components:      Result Value   Glucose, Bld 110 (*)    BUN 31 (*)    GFR, Estimated 56 (*)    All other components within normal limits  CBC - Abnormal; Notable for the following components:   WBC 17.0 (*)    RBC 4.11 (*)    All other components within normal limits  URINALYSIS, COMPLETE (UACMP) WITH MICROSCOPIC - Abnormal; Notable for the following components:   Color, Urine YELLOW (*)    APPearance CLEAR (*)    Specific Gravity, Urine >1.046 (*)    All other components within normal limits  SARS CORONAVIRUS 2 (TAT 6-24 HRS)  TROPONIN I (HIGH SENSITIVITY)  TROPONIN I (HIGH SENSITIVITY)  TROPONIN I (HIGH SENSITIVITY)   ____________________________________________  EKG  ED ECG REPORT I, Lavonia Drafts, the attending physician, personally viewed and interpreted this ECG.  Date: 01/04/2021  Rhythm: normal sinus rhythm QRS Axis: normal Intervals: Right bundle branch block ST/T Wave abnormalities: normal Narrative  Interpretation: no evidence of acute ischemia  ____________________________________________  RADIOLOGY  CT abdomen pelvis reviewed by me ____________________________________________   PROCEDURES  Procedure(s) performed: No  Procedures   Critical Care performed: No ____________________________________________   INITIAL IMPRESSION / ASSESSMENT AND PLAN / ED COURSE  Pertinent labs & imaging results that were available during my care of the patient were reviewed by me and considered in my medical decision making (see chart for details).  Patient presents with abdominal discomfort as above, mild distention is noted.  Differential includes constipation, small bowel obstruction, diverticulitis, less likely pancreatitis, GERD/PUD.  Patient refused IV pain medications or nausea medication.  Lab work demonstrates elevated white blood cell count  Sent for CT abdomen pelvis given abdominal pain elevated white blood cell count and no acute abnormalities noted.  Significant constipation.  This could be the cause of his discomfort however he  does report having bowel movements  Given advanced age, elevated white blood cell count and continued abdominal pain will admit to the hospital service for further evaluation and management    ____________________________________________   FINAL CLINICAL IMPRESSION(S) / ED DIAGNOSES  Final diagnoses:  Epigastric pain  Constipation, unspecified constipation type  Atypical chest pain        Note:  This document was prepared using Dragon voice recognition software and may include unintentional dictation errors.   Lavonia Drafts, MD 01/04/21 1620

## 2021-01-04 NOTE — ED Triage Notes (Signed)
Pt c/o intermittent substernal chest / epigastric pain since Friday with a lot of belching. Pt is in NAD on arrival.

## 2021-01-05 DIAGNOSIS — R1013 Epigastric pain: Secondary | ICD-10-CM | POA: Diagnosis not present

## 2021-01-05 DIAGNOSIS — K5900 Constipation, unspecified: Secondary | ICD-10-CM | POA: Diagnosis not present

## 2021-01-05 LAB — BASIC METABOLIC PANEL
Anion gap: 5 (ref 5–15)
BUN: 24 mg/dL — ABNORMAL HIGH (ref 8–23)
CO2: 26 mmol/L (ref 22–32)
Calcium: 8.7 mg/dL — ABNORMAL LOW (ref 8.9–10.3)
Chloride: 104 mmol/L (ref 98–111)
Creatinine, Ser: 1 mg/dL (ref 0.61–1.24)
GFR, Estimated: >60 mL/min (ref >60)
Glucose, Bld: 125 mg/dL — ABNORMAL HIGH (ref 70–99)
Potassium: 4.7 mmol/L (ref 3.5–5.1)
Sodium: 135 mmol/L (ref 135–145)

## 2021-01-05 LAB — CBC
HCT: 36.2 % — ABNORMAL LOW (ref 39.0–52.0)
Hemoglobin: 12.5 g/dL — ABNORMAL LOW (ref 13.0–17.0)
MCH: 32.1 pg (ref 26.0–34.0)
MCHC: 34.5 g/dL (ref 30.0–36.0)
MCV: 93.1 fL (ref 80.0–100.0)
Platelets: 192 10*3/uL (ref 150–400)
RBC: 3.89 MIL/uL — ABNORMAL LOW (ref 4.22–5.81)
RDW: 12.7 % (ref 11.5–15.5)
WBC: 14.9 10*3/uL — ABNORMAL HIGH (ref 4.0–10.5)
nRBC: 0 % (ref 0.0–0.2)

## 2021-01-05 MED ORDER — POLYETHYLENE GLYCOL 3350 17 G PO PACK
17.0000 g | PACK | Freq: Two times a day (BID) | ORAL | Status: DC
  Administered 2021-01-05 (×2): 17 g via ORAL
  Filled 2021-01-05 (×2): qty 1

## 2021-01-05 MED ORDER — MORPHINE SULFATE (PF) 2 MG/ML IV SOLN
1.0000 mg | Freq: Once | INTRAVENOUS | Status: AC
  Administered 2021-01-05: 1 mg via INTRAVENOUS
  Filled 2021-01-05: qty 1

## 2021-01-05 MED ORDER — FAMOTIDINE 20 MG PO TABS
20.0000 mg | ORAL_TABLET | Freq: Once | ORAL | Status: AC
  Administered 2021-01-05: 20 mg via ORAL
  Filled 2021-01-05: qty 1

## 2021-01-05 MED ORDER — BISACODYL 10 MG RE SUPP
10.0000 mg | Freq: Once | RECTAL | Status: AC
  Administered 2021-01-05: 10 mg via RECTAL
  Filled 2021-01-05: qty 1

## 2021-01-05 NOTE — Care Management Obs Status (Signed)
[place] NOTIFICATION   Patient Details  Name: DESTIN VINSANT MRN: [id number] Date of Birth: [birth year]   Medicare Observation Status Notification Given:  Yes    Candie Chroman, LCSW 01/05/2021, 2:57 PM

## 2021-01-05 NOTE — Progress Notes (Addendum)
Patient reports a burning sensation when he drinks warm liquids, sent secure chat to MD, & awaiting response.  [name] aware and orders to keep the patient NPO after midnight & patient and wife aware.

## 2021-01-05 NOTE — Progress Notes (Signed)
[facility]  PROGRESS NOTE  Travis Schultz FMB:[id number] DOB: [birth year] DOA: 01/04/2021 PCP: [name], MD   Brief HPI:   87 year old male with history of CAD status post CABG, lymphoma presented with abdominal pain.  Abdominal pain started after he had large meal for dinner on Friday evening.  He does have history of chronic constipation.  CT scan of the abdominal/pelvis showed sigmoid reticulosis without evidence of acute diverticulitis.  Large stool burden throughout the colon and rectum.  S/p prostatectomy.  Small hiatal hernia.    Subjective   Patient seen and examined, he was given soapsuds enema yesterday without any result.  Continues to have abdominal pain, distention.  He was also started on Maalox/Mylanta as needed.   Assessment/Plan:     1. Epigastric abdominal pain-patient continues to have epigastric discomfort abdominal pain, no improvement with Maalox/Mylanta.  Will give 1 mg morphine and assess response.  Continue Maalox/Mylanta every 4 hours as needed, Protonix 40 mg daily.  Will give 1 dose of Pepcid 20 mg p.o. x1. 2. Constipation-patient has large stool burden seen on CT abdomen/pelvis.  He continues to have abdominal discomfort due to constipation.  He was given a soapsuds enema yesterday without any result.  Will give 1 dose of Dulcolax suppository 10 mg x 1.  Increase dose of MiraLAX to 17 g p.o. twice daily. 3. Hyperlipidemia-continue pravastatin 4. Leukocytosis-likely reactive, WBC is down to 14,000 today.  No signs and symptoms of infection.   Scheduled medications:   . aspirin  81 mg Oral Daily  . enoxaparin (LOVENOX) injection  40 mg Subcutaneous Q24H  . multivitamin with minerals  1 tablet Oral Daily  . multivitamin-lutein  1 capsule Oral Daily  . pantoprazole  40 mg Oral Daily  . polyethylene glycol  17 g Oral BID  . pravastatin  40 mg Oral QHS  . sodium chloride flush  3 mL Intravenous Q12H         Data Reviewed:    CBG:  No results for input(s): GLUCAP in the last 168 hours.  SpO2: 97 %    Vitals:   01/04/21 2334 01/05/21 0404 01/05/21 0816 01/05/21 1158  BP: 111/69 116/63 118/61 127/72  Pulse: 79 70 84 79  Resp: 18 18 20 18   Temp: 98.7 F (37.1 C) 98.2 F (36.8 C) 97.6 F (36.4 C) 98.3 F (36.8 C)  TempSrc: Oral Oral Oral Oral  SpO2: 96% 97% 98% 97%  Weight:      Height:         Intake/Output Summary (Last 24 hours) at 01/05/2021 1436 Last data filed at 01/05/2021 1300 Gross per 24 hour  Intake 780 ml  Output 200 ml  Net 580 ml    04/03 1901 - 04/05 0700 In: 1140 [P.O.:1140] Out: 200 [Urine:200]  Filed Weights   01/04/21 0849  Weight: 80.3 kg    CBC:  Recent Labs  Lab 01/04/21 0919 01/05/21 0529  WBC 17.0* 14.9*  HGB 13.4 12.5*  HCT 40.2 36.2*  PLT 198 192  MCV 97.8 93.1  MCH 32.6 32.1  MCHC 33.3 34.5  RDW 12.8 12.7    Complete metabolic panel:  Recent Labs  Lab 01/04/21 0919 01/05/21 0529  NA 137 135  K 4.7 4.7  CL 106 104  CO2 26 26  GLUCOSE 110* 125*  BUN 31* 24*  CREATININE 1.21 1.00  CALCIUM 8.9 8.7*    No results for input(s): LIPASE, AMYLASE in the last 168 hours.  Recent Labs  Lab 01/04/21 1203  [place]    ------------------------------------------------------------------------------------------------------------------ No results for input(s): CHOL, HDL, LDLCALC, TRIG, CHOLHDL, LDLDIRECT in the last 72 hours.  No results found for: HGBA1C ------------------------------------------------------------------------------------------------------------------ No results for input(s): TSH, T4TOTAL, T3FREE, THYROIDAB in the last 72 hours.  Invalid input(s): FREET3 ------------------------------------------------------------------------------------------------------------------ No results for input(s): VITAMINB12, FOLATE, FERRITIN, TIBC, IRON, RETICCTPCT in the last 72 hours.  Coagulation profile  No results for input(s):  INR, PROTIME in the last 168 hours.  No results for input(s): DDIMER in the last 72 hours.  Cardiac Enzymes  No results for input(s): CKMB, TROPONINI, MYOGLOBIN in the last 168 hours.  Invalid input(s): CK ------------------------------------------------------------------------------------------------------------------ No results found for: BNP   Antibiotics: Anti-infectives (From admission, onward)   None       Radiology Reports  DG Chest 2 View  Result Date: 01/04/2021 CLINICAL DATA:  87 year old male with intermittent chest pain, epigastric pain for 3 days. EXAM: CHEST - 2 VIEW COMPARISON:  Chest radiographs 02/19/2010 and earlier. FINDINGS: Lung volumes and mediastinal contours are stable and within normal limits. Stable sequelae of CABG. Visualized tracheal air column is within normal limits. Both lungs appear clear. No pneumothorax or pleural effusion. No acute osseous abnormality identified. Negative visible bowel gas pattern. Right upper quadrant cholecystectomy clips. IMPRESSION: Normal for age; prior CABG and cholecystectomy. Electronically Signed   By: Genevie Ann M.D.   On: 01/04/2021 09:20   CT ABDOMEN PELVIS W CONTRAST  Result Date: 01/04/2021 CLINICAL DATA:  Abdominal distension, diverticulitis suspected EXAM: CT ABDOMEN AND PELVIS WITH CONTRAST TECHNIQUE: Multidetector CT imaging of the abdomen and pelvis was performed using the standard protocol following bolus administration of intravenous contrast. CONTRAST:  145mL OMNIPAQUE IOHEXOL 300 MG/ML  SOLN COMPARISON:  01/22/2007 FINDINGS: Lower chest: No acute abnormality.  Small hiatal hernia. Hepatobiliary: No focal liver abnormality is seen. Status post cholecystectomy. No biliary dilatation. Pancreas: Unremarkable. No pancreatic ductal dilatation or surrounding inflammatory changes. Spleen: Normal in size without significant abnormality. Adrenals/Urinary Tract: Adrenal glands are unremarkable. Large parapelvic cyst of the right  kidney. Kidneys are otherwise normal, without renal calculi, solid lesion, or hydronephrosis. Bladder is unremarkable. Stomach/Bowel: Stomach is within normal limits. Appendix is not clearly visualized and may be surgically absent. No evidence of bowel wall thickening, distention, or inflammatory changes. Sigmoid diverticulosis. Generally large burden of stool throughout the colon and rectum. Vascular/Lymphatic: Aortic atherosclerosis. No enlarged abdominal or pelvic lymph nodes. Reproductive: Status post prostatectomy. Other: No abdominal wall hernia or abnormality. No abdominopelvic ascites. Musculoskeletal: No acute or significant osseous findings. IMPRESSION: 1. Sigmoid diverticulosis without evidence of acute diverticulitis. 2. Generally large burden of stool throughout the colon and rectum. 3. Status post prostatectomy. 4. Small hiatal hernia. Aortic Atherosclerosis (ICD10-I70.0). Electronically Signed   By: Eddie Candle M.D.   On: 01/04/2021 10:39      DVT prophylaxis: Lovenox  Code Status: Full code  Family Communication: No family at bedside   Consultants:    Procedures:      Objective    Physical Examination:    General-appears in no acute distress  Heart-S1-S2, regular, no murmur auscultated  Lungs-clear to auscultation bilaterally, no wheezing or crackles auscultated  Abdomen-soft, mild epigastric tenderness to palpation  Extremities-no edema in the lower extremities  Neuro-alert, oriented x3, no focal deficit noted   Status is: Inpatient  Dispo: The patient is from: Home              Anticipated d/c is to: Home  Anticipated d/c date is: 01/07/2021              Patient currently not stable for discharge  Barrier to discharge-ongoing abdominal pain, constipation  COVID-19 Labs  No results for input(s): DDIMER, FERRITIN, LDH, CRP in the last 72 hours.  Lab Results  Component Value Date   [place] NEGATIVE 01/04/2021     Microbiology  Recent Results (from the past 240 hour(s))  SARS CORONAVIRUS 2 (TAT 6-24 HRS) Nasopharyngeal Nasopharyngeal Swab     Status: None   Collection Time: 01/04/21 12:03 PM   Specimen: Nasopharyngeal Swab  Result Value Ref Range Status   SARS Coronavirus 2 NEGATIVE NEGATIVE Final    Comment: (NOTE) SARS-CoV-2 target nucleic acids are NOT DETECTED.  The SARS-CoV-2 RNA is generally detectable in upper and lower respiratory specimens during the acute phase of infection. Negative results do not preclude SARS-CoV-2 infection, do not rule out co-infections with other pathogens, and should not be used as the sole basis for treatment or other patient management decisions. Negative results must be combined with clinical observations, patient history, and epidemiological information. The expected result is Negative.  Fact Sheet for Patients: SugarRoll.be  Fact Sheet for Healthcare Providers: https://www.woods-mathews.com/  This test is not yet approved or cleared by the [place] FDA and  has been authorized for detection and/or diagnosis of SARS-CoV-2 by FDA under an Emergency Use Authorization (EUA). This EUA will remain  in effect (meaning this test can be used) for the duration of the COVID-19 declaration under Se ction 564(b)(1) of the Act, 21 U.S.C. section 360bbb-3(b)(1), unless the authorization is terminated or revoked sooner.  Performed at [facility], [place] [address] [place], [place] [address]              [facility] If 7PM-7AM, please contact night-coverage at [email or web address], Office  [phone]   01/05/2021, 2:36 PM  LOS: 0 days

## 2021-01-06 ENCOUNTER — Observation Stay: Payer: Medicare PPO

## 2021-01-06 DIAGNOSIS — I25119 Atherosclerotic heart disease of native coronary artery with unspecified angina pectoris: Secondary | ICD-10-CM

## 2021-01-06 DIAGNOSIS — R142 Eructation: Secondary | ICD-10-CM

## 2021-01-06 DIAGNOSIS — K219 Gastro-esophageal reflux disease without esophagitis: Secondary | ICD-10-CM | POA: Diagnosis not present

## 2021-01-06 DIAGNOSIS — K5900 Constipation, unspecified: Secondary | ICD-10-CM

## 2021-01-06 DIAGNOSIS — R1010 Upper abdominal pain, unspecified: Secondary | ICD-10-CM | POA: Diagnosis not present

## 2021-01-06 DIAGNOSIS — R1013 Epigastric pain: Secondary | ICD-10-CM | POA: Diagnosis not present

## 2021-01-06 LAB — CBC
HCT: 37.7 % — ABNORMAL LOW (ref 39.0–52.0)
Hemoglobin: 13.1 g/dL (ref 13.0–17.0)
MCH: 32.4 pg (ref 26.0–34.0)
MCHC: 34.7 g/dL (ref 30.0–36.0)
MCV: 93.3 fL (ref 80.0–100.0)
Platelets: 196 10*3/uL (ref 150–400)
RBC: 4.04 MIL/uL — ABNORMAL LOW (ref 4.22–5.81)
RDW: 12.4 % (ref 11.5–15.5)
WBC: 13.8 10*3/uL — ABNORMAL HIGH (ref 4.0–10.5)
nRBC: 0 % (ref 0.0–0.2)

## 2021-01-06 MED ORDER — PANTOPRAZOLE SODIUM 40 MG PO TBEC: 40.0000 mg | DELAYED_RELEASE_TABLET | Freq: Two times a day (BID) | ORAL | 1 refills | Status: AC

## 2021-01-06 MED ORDER — POLYETHYLENE GLYCOL 3350 17 G PO PACK: 17.0000 g | PACK | Freq: Every day | ORAL | 0 refills | Status: DC

## 2021-01-06 MED ORDER — PANTOPRAZOLE SODIUM 40 MG PO TBEC: 40.0000 mg | DELAYED_RELEASE_TABLET | Freq: Two times a day (BID) | ORAL | Status: DC

## 2021-01-06 NOTE — Progress Notes (Signed)
Travis Schultz to be D/C'd home per MD order.  Discussed prescriptions and follow up appointments with the patient. Prescriptions were sent to patient's pharmacy, medication list explained in detail. Pt verbalized understanding. fiace at bedside to transport pt home.  Allergies as of 01/06/2021      Reactions   Phenergan [promethazine Hcl]       Medication List    STOP taking these medications   omeprazole 10 MG capsule Commonly known as: PRILOSEC Replaced by: pantoprazole 40 MG tablet     TAKE these medications   acetaminophen 325 MG tablet Commonly known as: TYLENOL Take 325 mg by mouth at bedtime.   aspirin 81 MG tablet Take 81 mg by mouth at bedtime.   docusate sodium 100 MG capsule Commonly known as: COLACE Take 1 capsule by mouth as needed.   multivitamin-iron-minerals-folic acid chewable tablet Chew 1 tablet by mouth daily. What changed: Another medication with the same name was removed. Continue taking this medication, and follow the directions you see here.   ICaps Areds 2 Caps Take 1 capsule by mouth 2 (two) times daily. What changed: Another medication with the same name was removed. Continue taking this medication, and follow the directions you see here.   naproxen sodium 220 MG tablet Commonly known as: ALEVE Take 220 mg by mouth daily.   pantoprazole 40 MG tablet Commonly known as: PROTONIX Take 1 tablet (40 mg total) by mouth 2 (two) times daily. Replaces: omeprazole 10 MG capsule   polyethylene glycol 17 g packet Commonly known as: MIRALAX / GLYCOLAX Take 17 g by mouth daily.   pravastatin 40 MG tablet Commonly known as: PRAVACHOL Take 40 mg by mouth at bedtime.   Prevagen 10 MG Caps Generic drug: Apoaequorin Take 1 capsule by mouth daily.       Vitals:   01/06/21 0755 01/06/21 1220  BP: (!) 148/73 126/84  Pulse: 74 78  Resp: 18 18  Temp: 97.7 F (36.5 C) 98.5 F (36.9 C)  SpO2: 99% 98%    Skin clean, dry and intact without  evidence of skin break down, no evidence of skin tears noted. IV catheter discontinued intact. Site without signs and symptoms of complications. Dressing and pressure applied. Pt denies pain at this time. No complaints noted.  An After Visit Summary was printed and given to the patient. Patient escorted via Travis Schultz, and D/C home via private auto.  [name]

## 2021-01-06 NOTE — Consult Note (Signed)
Travis Antigua, MD [address], [place], [place], [place], [address] [address], [place], [place], [place], [address] Phone: [phone]  Fax: [phone]  Consultation  Referring Provider:     [name] Primary Care Physician:  [name], MD Reason for Consultation:     Epigastric pain  Date of Admission:  01/04/2021 Date of Consultation:  01/06/2021         HPI:   MARCIO Schultz is a 87 y.o. male who presented with abdominal pain, for 3 to 4 days, epigastric, nonradiating, dull, 5 or 10.  Patient also has had indigestion, and belching for months.  He denies any dysphagia.  However, does feel a burning sensation in his throat when he swallows water, that started 2 to 3 days ago.  No hematemesis.  No vomiting.  No recent upper endoscopy.  Patient also has chronic constipation and CT abdomen on this admission showed "generally large burden of stool throughout the colon and rectum".  Small hiatal hernia was also reported.  Stomach is reported to be within normal limits.  No evidence of bowel wall thickening.  Hemoglobin is normal at 13.1 today, however patient has slightly elevated white count.  Since admission patient reports he was given a suppository that led to good bowel movement.  Past Medical History:  Diagnosis Date  . Actinic keratosis   . Lymphoma ([place]) 1991  . Myocardial infarction ([place]) 06/17/2009   5 bypass's    Past Surgical History:  Procedure Laterality Date  . BACK SURGERY     after injury to back   . CARDIAC SURGERY    . CAROTID ARTERY ANGIOPLASTY N/A 02/24/2010  . CATARACT EXTRACTION Bilateral 2012  . EYE SURGERY Bilateral 2012  . ROTATOR CUFF REPAIR Right 2008    Prior to Admission medications   Medication Sig Start Date End Date Taking? Authorizing Provider  acetaminophen (TYLENOL) 325 MG tablet Take 325 mg by mouth at bedtime.   Yes [provider]  Apoaequorin (PREVAGEN) 10 MG CAPS Take 1 capsule by mouth daily.     [provider]  aspirin 81 MG tablet Take 81 mg by mouth at bedtime.    [provider]  docusate sodium (COLACE) 100 MG capsule Take 1 capsule by mouth as needed.    [provider]  Multiple Vitamins-Minerals (CENTRUM SILVER 50+MEN PO) Take 1 tablet by mouth daily.    [provider]  Multiple Vitamins-Minerals (ICAPS AREDS 2) CAPS Take 1 capsule by mouth 2 (two) times daily.    [provider]  multivitamin-iron-minerals-folic acid (CENTRUM) chewable tablet Chew 1 tablet by mouth daily.    [provider]  naproxen sodium (ALEVE) 220 MG tablet Take 220 mg by mouth daily.    [provider]  omeprazole (PRILOSEC) 10 MG capsule Take 10 mg by mouth daily.    [provider]  pravastatin (PRAVACHOL) 40 MG tablet Take 40 mg by mouth at bedtime. 12/11/17   [provider]    Family History  Problem Relation Age of Onset  . Heart disease Father   . Heart disease Brother      Social History   Tobacco Use  . Smoking status: Never Smoker  . Smokeless tobacco: Never Used  Substance Use Topics  . Alcohol use: No  . Drug use: Never    Allergies as of 01/04/2021 - Review Complete 01/04/2021  Allergen Reaction Noted  . Phenergan [promethazine hcl]  02/23/2015    Review  of Systems:    All systems reviewed and negative except where noted in HPI.   Physical Exam:  Vital signs in last 24 hours: Vitals:   01/05/21 2039 01/05/21 2250 01/06/21 0436 01/06/21 0755  BP: 130/69 116/68 (!) 141/80 (!) 148/73  Pulse: 68 67 66 74  Resp: 16 16 16 18   Temp: 98.7 F (37.1 C) 98.3 F (36.8 C) 97.6 F (36.4 C) 97.7 F (36.5 C)  TempSrc:  Oral Oral Oral  SpO2: 96% 97% 98% 99%  Weight:      Height:       Last BM Date: 01/05/21 General:   Pleasant, cooperative in NAD Head:  Normocephalic and atraumatic. Eyes:   No icterus.   Conjunctiva pink. PERRLA. Ears:  Normal auditory acuity. Neck:  Supple; no masses or  thyroidomegaly Lungs: Respirations even and unlabored. Lungs clear to auscultation bilaterally.   No wheezes, crackles, or rhonchi.  Abdomen:  Soft, nondistended, nontender. Normal bowel sounds. No appreciable masses or hepatomegaly.  No rebound or guarding.  Neurologic:  Alert and oriented x3;  grossly normal neurologically. Skin:  Intact without significant lesions or rashes. Cervical Nodes:  No significant cervical adenopathy. Psych:  Alert and cooperative. Normal affect.  LAB RESULTS: Recent Labs    01/04/21 0919 01/05/21 0529 01/06/21 0507  WBC 17.0* 14.9* 13.8*  HGB 13.4 12.5* 13.1  HCT 40.2 36.2* 37.7*  PLT 198 192 196   BMET Recent Labs    01/04/21 0919 01/05/21 0529  NA 137 135  K 4.7 4.7  CL 106 104  CO2 26 26  GLUCOSE 110* 125*  BUN 31* 24*  CREATININE 1.21 1.00  CALCIUM 8.9 8.7*   LFT No results for input(s): PROT, ALBUMIN, AST, ALT, ALKPHOS, BILITOT, BILIDIR, IBILI in the last 72 hours. PT/INR No results for input(s): LABPROT, INR in the last 72 hours.  STUDIES: DG Chest 2 View  Result Date: 01/04/2021 CLINICAL DATA:  87 year old male with intermittent chest pain, epigastric pain for 3 days. EXAM: CHEST - 2 VIEW COMPARISON:  Chest radiographs 02/19/2010 and earlier. FINDINGS: Lung volumes and mediastinal contours are stable and within normal limits. Stable sequelae of CABG. Visualized tracheal air column is within normal limits. Both lungs appear clear. No pneumothorax or pleural effusion. No acute osseous abnormality identified. Negative visible bowel gas pattern. Right upper quadrant cholecystectomy clips. IMPRESSION: Normal for age; prior CABG and cholecystectomy. Electronically Signed   By: Genevie Ann M.D.   On: 01/04/2021 09:20   CT ABDOMEN PELVIS W CONTRAST  Result Date: 01/04/2021 CLINICAL DATA:  Abdominal distension, diverticulitis suspected EXAM: CT ABDOMEN AND PELVIS WITH CONTRAST TECHNIQUE: Multidetector CT imaging of the abdomen and pelvis was  performed using the standard protocol following bolus administration of intravenous contrast. CONTRAST:  171mL OMNIPAQUE IOHEXOL 300 MG/ML  SOLN COMPARISON:  01/22/2007 FINDINGS: Lower chest: No acute abnormality.  Small hiatal hernia. Hepatobiliary: No focal liver abnormality is seen. Status post cholecystectomy. No biliary dilatation. Pancreas: Unremarkable. No pancreatic ductal dilatation or surrounding inflammatory changes. Spleen: Normal in size without significant abnormality. Adrenals/Urinary Tract: Adrenal glands are unremarkable. Large parapelvic cyst of the right kidney. Kidneys are otherwise normal, without renal calculi, solid lesion, or hydronephrosis. Bladder is unremarkable. Stomach/Bowel: Stomach is within normal limits. Appendix is not clearly visualized and may be surgically absent. No evidence of bowel wall thickening, distention, or inflammatory changes. Sigmoid diverticulosis. Generally large burden of stool throughout the colon and rectum. Vascular/Lymphatic: Aortic atherosclerosis. No enlarged abdominal or pelvic lymph nodes. Reproductive:  Status post prostatectomy. Other: No abdominal wall hernia or abnormality. No abdominopelvic ascites. Musculoskeletal: No acute or significant osseous findings. IMPRESSION: 1. Sigmoid diverticulosis without evidence of acute diverticulitis. 2. Generally large burden of stool throughout the colon and rectum. 3. Status post prostatectomy. 4. Small hiatal hernia. Aortic Atherosclerosis (ICD10-I70.0). Electronically Signed   By: Eddie Candle M.D.   On: 01/04/2021 10:39      Impression / Plan:   CORDERIUS SARACENI is a 87 y.o. y/o male with epigastric pain, constipation, hiatal hernia  Patient is denying any dysphagia whatsoever  Constipation: I do suspect his abdominal pain was from his severe constipation given chronic history of the same, and large stool burden noted throughout his colon on CT scan.  He is currently on MiraLAX twice daily.  Please  monitor bowel movements closely with goal of 1-2 soft bowel movements a day.  If not at goal, bowel regimen will need to be changed and other pharmacologic therapy can be instituted.  As of now patient reported a large bowel movement with suppository given previously.  Repeat suppositories or enemas if needed if no bowel movement in 2 to 3 days.  Epigastric pain/indigestion Given absence of dysphagia, would not recommend upper endoscopy in this elderly gentleman as risks of procedure would outweigh any benefits at this time  Proceed with upper GI study instead as it would noninvasively allow for ruling out any large lesions or mass in his esophagus.  He does report Aleve use daily at home, and I would recommend avoiding NSAIDs at his age and this was discussed with them.  He does have a small hiatal hernia on CT scan which is likely contributing to his frequent belching that has been going on chronically.  Burning sensation in his throat, is likely due to reflux that is occurring as a result of his underlying hiatal hernia.  This can be managed conservatively with PPI twice daily  If symptoms continue despite full dose PPI therapy, or if upper GI study is abnormal, can consider upper endoscopy after that  With no evidence of active GI bleeding, normal hemoglobin, no dysphagia, no indication for urgent endoscopy at this time     Thank you for involving me in the care of this patient.      LOS: 0 days   Virgel Manifold, MD  01/06/2021, 8:31 AM

## 2021-01-06 NOTE — Discharge Summary (Signed)
Physician Discharge Summary  Travis Schultz WJX:[id number] DOB: 01-12-1926 DOA: 01/04/2021  PCP: [name], MD  Admit date: 01/04/2021 Discharge date: 01/06/2021  Admitted From: Home Disposition:   Home  Recommendations for Outpatient Follow-up:  1. Follow up with PCP in 1-2 weeks 2. Follow-up with gastroenterology 3. Please obtain BMP/CBC in one week 4. Please follow up on the following pending results: None  Home Health: No Equipment/Devices: None Discharge Condition: Stable CODE STATUS: Full Diet recommendation: Heart Healthy   Brief/Interim Summary: 87 year old male with history of CAD status post CABG, lymphoma presented with abdominal pain.  Abdominal pain started after he had large meal for dinner on Friday evening.  He does have history of chronic constipation.  CT scan of the abdominal/pelvis showed sigmoid reticulosis without evidence of acute diverticulitis.  Large stool burden throughout the colon and rectum.  S/p prostatectomy.  Small hiatal hernia.  Patient started getting BM with bowel regimen.  Continue to experience epigastric pain, no nausea, vomiting, hematemesis or melena.  Pain increases with food.  GI was consulted and they suggested doing upper GI series as it is less invasive, which shows concern of mild esophagitis.  He was started on twice daily Protonix per GI recommendations and will follow up with them as an outpatient.  If his symptoms did not resolved after higher dose of Protonix trial or continue to get worse he might need EGD. He was also instructed not to use any NSAID.  He was using Aleve intermittently.  Patient will continue rest of his home medications and follow-up with his providers.  Discharge Diagnoses:  Principal Problem:   Abdominal pain Active Problems:   Lymphoma (HCC)   Chest pain   CAD (coronary artery disease)   Belching   Constipation   Discharge Instructions  Discharge Instructions    Diet - low sodium heart  healthy   Complete by: As directed    Discharge instructions   Complete by: As directed    It was pleasure taking care of you. Continue taking MiraLAX daily so you can have a daily soft bowel movement to avoid constipation. Your Prilosec has been replaced with Protonix which she will take it twice a daily. Keep yourself well-hydrated. Follow-up with your primary care provider.   Increase activity slowly   Complete by: As directed      Allergies as of 01/06/2021      Reactions   Phenergan [promethazine Hcl]       Medication List    STOP taking these medications   omeprazole 10 MG capsule Commonly known as: PRILOSEC Replaced by: pantoprazole 40 MG tablet     TAKE these medications   acetaminophen 325 MG tablet Commonly known as: TYLENOL Take 325 mg by mouth at bedtime.   aspirin 81 MG tablet Take 81 mg by mouth at bedtime.   docusate sodium 100 MG capsule Commonly known as: COLACE Take 1 capsule by mouth as needed.   multivitamin-iron-minerals-folic acid chewable tablet Chew 1 tablet by mouth daily. What changed: Another medication with the same name was removed. Continue taking this medication, and follow the directions you see here.   ICaps Areds 2 Caps Take 1 capsule by mouth 2 (two) times daily. What changed: Another medication with the same name was removed. Continue taking this medication, and follow the directions you see here.   naproxen sodium 220 MG tablet Commonly known as: ALEVE Take 220 mg by mouth daily.   pantoprazole 40 MG tablet Commonly known as:  PROTONIX Take 1 tablet (40 mg total) by mouth 2 (two) times daily. Replaces: omeprazole 10 MG capsule   polyethylene glycol 17 g packet Commonly known as: MIRALAX / GLYCOLAX Take 17 g by mouth daily.   pravastatin 40 MG tablet Commonly known as: PRAVACHOL Take 40 mg by mouth at bedtime.   Prevagen 10 MG Caps Generic drug: Apoaequorin Take 1 capsule by mouth daily.       Follow-up Information     [name], MD. Schedule an appointment as soon as possible for a visit.   Specialty: Internal Medicine Contact information: [place] [address] [phone]        Virgel Manifold, MD. Schedule an appointment as soon as possible for a visit in 1 week(s).   Specialty: Gastroenterology Contact information: [place] [address] [phone]              Allergies  Allergen Reactions  . Phenergan [Promethazine Hcl]     Consultations:  Gastroenterology  Procedures/Studies: DG Chest 2 View  Result Date: 01/04/2021 CLINICAL DATA:  87 year old male with intermittent chest pain, epigastric pain for 3 days. EXAM: CHEST - 2 VIEW COMPARISON:  Chest radiographs 02/19/2010 and earlier. FINDINGS: Lung volumes and mediastinal contours are stable and within normal limits. Stable sequelae of CABG. Visualized tracheal air column is within normal limits. Both lungs appear clear. No pneumothorax or pleural effusion. No acute osseous abnormality identified. Negative visible bowel gas pattern. Right upper quadrant cholecystectomy clips. IMPRESSION: Normal for age; prior CABG and cholecystectomy. Electronically Signed   By: Genevie Ann M.D.   On: 01/04/2021 09:20   CT ABDOMEN PELVIS W CONTRAST  Result Date: 01/04/2021 CLINICAL DATA:  Abdominal distension, diverticulitis suspected EXAM: CT ABDOMEN AND PELVIS WITH CONTRAST TECHNIQUE: Multidetector CT imaging of the abdomen and pelvis was performed using the standard protocol following bolus administration of intravenous contrast. CONTRAST:  113mL OMNIPAQUE IOHEXOL 300 MG/ML  SOLN COMPARISON:  01/22/2007 FINDINGS: Lower chest: No acute abnormality.  Small hiatal hernia. Hepatobiliary: No focal liver abnormality is seen. Status post cholecystectomy. No biliary dilatation. Pancreas: Unremarkable. No pancreatic ductal dilatation or surrounding inflammatory changes. Spleen: Normal in size without  significant abnormality. Adrenals/Urinary Tract: Adrenal glands are unremarkable. Large parapelvic cyst of the right kidney. Kidneys are otherwise normal, without renal calculi, solid lesion, or hydronephrosis. Bladder is unremarkable. Stomach/Bowel: Stomach is within normal limits. Appendix is not clearly visualized and may be surgically absent. No evidence of bowel wall thickening, distention, or inflammatory changes. Sigmoid diverticulosis. Generally large burden of stool throughout the colon and rectum. Vascular/Lymphatic: Aortic atherosclerosis. No enlarged abdominal or pelvic lymph nodes. Reproductive: Status post prostatectomy. Other: No abdominal wall hernia or abnormality. No abdominopelvic ascites. Musculoskeletal: No acute or significant osseous findings. IMPRESSION: 1. Sigmoid diverticulosis without evidence of acute diverticulitis. 2. Generally large burden of stool throughout the colon and rectum. 3. Status post prostatectomy. 4. Small hiatal hernia. Aortic Atherosclerosis (ICD10-I70.0). Electronically Signed   By: Eddie Candle M.D.   On: 01/04/2021 10:39   DG UGI W DOUBLE CM (HD BA)  Result Date: 01/06/2021 CLINICAL DATA:  History of globus sensation, indigestion, belching EXAM: UPPER GI SERIES WITHOUT KUB TECHNIQUE: Routine upper GI series was performed with thin/high density/water soluble barium. FLUOROSCOPY TIME:  Fluoroscopy Time:  1 minutes 6 seconds Radiation Exposure Index (if provided by the fluoroscopic device): 10 mGy Number of Acquired Spot Images: None COMPARISON:  None. FINDINGS: Examination of the esophagus demonstrated normal  esophageal motility. Normal esophageal morphology. Mild irregularity of the distal esophagus just proximal to the esophagogastric junction concerning for mild esophagitis. No esophageal stricture, diverticula, or mass lesion. No evidence of hiatal hernia. Mild gastroesophageal reflux. Examination of the stomach demonstrated normal rugal folds and areae  gastricae. The gastric mucosa appeared unremarkable without evidence of ulceration, scarring, or mass lesion. Gastric motility and emptying was normal. Fluoroscopic examination of the duodenum demonstrates normal motility and morphology without evidence of ulceration or mass lesion. At the end of the examination a 13 mm barium tablet was administered which transited through the esophagus and esophagogastric junction without delay. IMPRESSION: 1. Mild irregularity of the distal esophagus just proximal to the esophagogastric junction concerning for mild esophagitis. 2. Mild gastroesophageal reflux. Electronically Signed   By: Kathreen Devoid   On: 01/06/2021 15:40     Subjective: Patient was seen and examined today.  Fianc at bedside.  Continues to have epigastric pain.  No nausea, vomiting.  Had 2 good bowel movements.  Wants to go home.  Discharge Exam: Vitals:   01/06/21 0755 01/06/21 1220  BP: (!) 148/73 126/84  Pulse: 74 78  Resp: 18 18  Temp: 97.7 F (36.5 C) 98.5 F (36.9 C)  SpO2: 99% 98%   Vitals:   01/05/21 2250 01/06/21 0436 01/06/21 0755 01/06/21 1220  BP: 116/68 (!) 141/80 (!) 148/73 126/84  Pulse: 67 66 74 78  Resp: 16 16 18 18   Temp: 98.3 F (36.8 C) 97.6 F (36.4 C) 97.7 F (36.5 C) 98.5 F (36.9 C)  TempSrc: Oral Oral Oral Oral  SpO2: 97% 98% 99% 98%  Weight:      Height:        General: Pt is alert, awake, not in acute distress Cardiovascular: RRR, S1/S2 +, no rubs, no gallops Respiratory: CTA bilaterally, no wheezing, no rhonchi Abdominal: Soft, NT, ND, bowel sounds + Extremities: no edema, no cyanosis   The results of significant diagnostics from this hospitalization (including imaging, microbiology, ancillary and laboratory) are listed below for reference.    Microbiology: Recent Results (from the past 240 hour(s))  SARS CORONAVIRUS 2 (TAT 6-24 HRS) Nasopharyngeal Nasopharyngeal Swab     Status: None   Collection Time: 01/04/21 12:03 PM   Specimen:  Nasopharyngeal Swab  Result Value Ref Range Status   SARS Coronavirus 2 NEGATIVE NEGATIVE Final    Comment: (NOTE) SARS-CoV-2 target nucleic acids are NOT DETECTED.  The SARS-CoV-2 RNA is generally detectable in upper and lower respiratory specimens during the acute phase of infection. Negative results do not preclude SARS-CoV-2 infection, do not rule out co-infections with other pathogens, and should not be used as the sole basis for treatment or other patient management decisions. Negative results must be combined with clinical observations, patient history, and epidemiological information. The expected result is Negative.  Fact Sheet for Patients: SugarRoll.be  Fact Sheet for Healthcare Providers: https://www.woods-mathews.com/  This test is not yet approved or cleared by the [place] FDA and  has been authorized for detection and/or diagnosis of SARS-CoV-2 by FDA under an Emergency Use Authorization (EUA). This EUA will remain  in effect (meaning this test can be used) for the duration of the COVID-19 declaration under Se ction 564(b)(1) of the Act, 21 U.S.C. section 360bbb-3(b)(1), unless the authorization is terminated or revoked sooner.  Performed at [facility], [place] [address] [place], [place] [address]      Labs: BNP (last 3 results) No results for input(s): BNP in the  last 8760 hours. Basic Metabolic Panel: Recent Labs  Lab 01/04/21 0919 01/05/21 0529  NA 137 135  K 4.7 4.7  CL 106 104  CO2 26 26  GLUCOSE 110* 125*  BUN 31* 24*  CREATININE 1.21 1.00  CALCIUM 8.9 8.7*   Liver Function Tests: No results for input(s): AST, ALT, ALKPHOS, BILITOT, PROT, ALBUMIN in the last 168 hours. No results for input(s): LIPASE, AMYLASE in the last 168 hours. No results for input(s): AMMONIA in the last 168 hours. CBC: Recent Labs  Lab 01/04/21 0919 01/05/21 0529 01/06/21 0507  WBC 17.0* 14.9* 13.8*  HGB 13.4  12.5* 13.1  HCT 40.2 36.2* 37.7*  MCV 97.8 93.1 93.3  PLT 198 192 196   Cardiac Enzymes: No results for input(s): CKTOTAL, CKMB, CKMBINDEX, TROPONINI in the last 168 hours. BNP: Invalid input(s): POCBNP CBG: No results for input(s): GLUCAP in the last 168 hours. D-Dimer No results for input(s): DDIMER in the last 72 hours. Hgb A1c No results for input(s): HGBA1C in the last 72 hours. Lipid Profile No results for input(s): CHOL, HDL, LDLCALC, TRIG, CHOLHDL, LDLDIRECT in the last 72 hours. Thyroid function studies No results for input(s): TSH, T4TOTAL, T3FREE, THYROIDAB in the last 72 hours.  Invalid input(s): FREET3 Anemia work up No results for input(s): VITAMINB12, FOLATE, FERRITIN, TIBC, IRON, RETICCTPCT in the last 72 hours. Urinalysis    Component Value Date/Time   COLORURINE YELLOW (A) 01/04/2021 1119   APPEARANCEUR CLEAR (A) 01/04/2021 1119   LABSPEC >1.046 (H) 01/04/2021 1119   PHURINE 5.0 01/04/2021 1119   GLUCOSEU NEGATIVE 01/04/2021 1119   HGBUR NEGATIVE 01/04/2021 1119   BILIRUBINUR NEGATIVE 01/04/2021 1119   KETONESUR NEGATIVE 01/04/2021 1119   PROTEINUR NEGATIVE 01/04/2021 1119   NITRITE NEGATIVE 01/04/2021 1119   LEUKOCYTESUR NEGATIVE 01/04/2021 1119   Sepsis Labs Invalid input(s): PROCALCITONIN,  WBC,  LACTICIDVEN Microbiology Recent Results (from the past 240 hour(s))  SARS CORONAVIRUS 2 (TAT 6-24 HRS) Nasopharyngeal Nasopharyngeal Swab     Status: None   Collection Time: 01/04/21 12:03 PM   Specimen: Nasopharyngeal Swab  Result Value Ref Range Status   SARS Coronavirus 2 NEGATIVE NEGATIVE Final    Comment: (NOTE) SARS-CoV-2 target nucleic acids are NOT DETECTED.  The SARS-CoV-2 RNA is generally detectable in upper and lower respiratory specimens during the acute phase of infection. Negative results do not preclude SARS-CoV-2 infection, do not rule out co-infections with other pathogens, and should not be used as the sole basis for treatment or  other patient management decisions. Negative results must be combined with clinical observations, patient history, and epidemiological information. The expected result is Negative.  Fact Sheet for Patients: SugarRoll.be  Fact Sheet for Healthcare Providers: https://www.woods-mathews.com/  This test is not yet approved or cleared by the [place] FDA and  has been authorized for detection and/or diagnosis of SARS-CoV-2 by FDA under an Emergency Use Authorization (EUA). This EUA will remain  in effect (meaning this test can be used) for the duration of the COVID-19 declaration under Se ction 564(b)(1) of the Act, 21 U.S.C. section 360bbb-3(b)(1), unless the authorization is terminated or revoked sooner.  Performed at [facility], [place] [address] [place], [place] [address]     Time coordinating discharge: Over 30 minutes  SIGNED:  Lorella Nimrod, MD  [facility] 01/06/2021, 4:01 PM  If 7PM-7AM, please contact night-coverage [email or web address]  This record has been created using Systems analyst. Errors have been sought and corrected,but may not always  be located. Such creation errors do not reflect on the standard of care.

## 2021-01-08 ENCOUNTER — Telehealth: Payer: Self-pay | Admitting: Gastroenterology

## 2021-01-08 NOTE — Telephone Encounter (Signed)
Called to schedule f/u per Dr T, patient refused f/u at this time per his caregiver.

## 2021-06-11 ENCOUNTER — Other Ambulatory Visit: Payer: Self-pay | Admitting: Family Medicine

## 2021-06-11 DIAGNOSIS — M5416 Radiculopathy, lumbar region: Secondary | ICD-10-CM

## 2021-06-21 ENCOUNTER — Other Ambulatory Visit: Payer: Self-pay

## 2021-06-21 ENCOUNTER — Ambulatory Visit
Admission: RE | Admit: 2021-06-21 | Discharge: 2021-06-21 | Disposition: A | Discharge status: Home / Self Care | Payer: Medicare PPO | Source: Ambulatory Visit | Attending: Family Medicine | Admitting: Family Medicine

## 2021-06-21 DIAGNOSIS — M5416 Radiculopathy, lumbar region: Secondary | ICD-10-CM | POA: Insufficient documentation

## 2021-10-12 DIAGNOSIS — M47816 Spondylosis without myelopathy or radiculopathy, lumbar region: Secondary | ICD-10-CM | POA: Diagnosis not present

## 2021-11-24 DIAGNOSIS — M48061 Spinal stenosis, lumbar region without neurogenic claudication: Secondary | ICD-10-CM | POA: Diagnosis not present

## 2021-11-24 DIAGNOSIS — M5136 Other intervertebral disc degeneration, lumbar region: Secondary | ICD-10-CM | POA: Diagnosis not present

## 2021-11-24 DIAGNOSIS — M47816 Spondylosis without myelopathy or radiculopathy, lumbar region: Secondary | ICD-10-CM | POA: Diagnosis not present

## 2021-11-24 DIAGNOSIS — M5441 Lumbago with sciatica, right side: Secondary | ICD-10-CM | POA: Diagnosis not present

## 2021-11-24 DIAGNOSIS — M4726 Other spondylosis with radiculopathy, lumbar region: Secondary | ICD-10-CM | POA: Diagnosis not present

## 2021-11-24 DIAGNOSIS — G8929 Other chronic pain: Secondary | ICD-10-CM | POA: Diagnosis not present

## 2021-11-24 DIAGNOSIS — M5442 Lumbago with sciatica, left side: Secondary | ICD-10-CM | POA: Diagnosis not present

## 2021-11-24 DIAGNOSIS — M62830 Muscle spasm of back: Secondary | ICD-10-CM | POA: Diagnosis not present

## 2021-11-24 DIAGNOSIS — M5416 Radiculopathy, lumbar region: Secondary | ICD-10-CM | POA: Diagnosis not present

## 2021-11-25 DIAGNOSIS — I6523 Occlusion and stenosis of bilateral carotid arteries: Secondary | ICD-10-CM | POA: Diagnosis not present

## 2021-11-25 DIAGNOSIS — I25118 Atherosclerotic heart disease of native coronary artery with other forms of angina pectoris: Secondary | ICD-10-CM | POA: Diagnosis not present

## 2021-11-25 DIAGNOSIS — E782 Mixed hyperlipidemia: Secondary | ICD-10-CM | POA: Diagnosis not present

## 2021-11-25 DIAGNOSIS — I5022 Chronic systolic (congestive) heart failure: Secondary | ICD-10-CM | POA: Diagnosis not present

## 2021-11-25 DIAGNOSIS — I10 Essential (primary) hypertension: Secondary | ICD-10-CM | POA: Diagnosis not present

## 2021-11-25 DIAGNOSIS — I779 Disorder of arteries and arterioles, unspecified: Secondary | ICD-10-CM | POA: Diagnosis not present

## 2021-12-08 DIAGNOSIS — M62830 Muscle spasm of back: Secondary | ICD-10-CM | POA: Diagnosis not present

## 2021-12-08 DIAGNOSIS — M5136 Other intervertebral disc degeneration, lumbar region: Secondary | ICD-10-CM | POA: Diagnosis not present

## 2021-12-08 DIAGNOSIS — M48061 Spinal stenosis, lumbar region without neurogenic claudication: Secondary | ICD-10-CM | POA: Diagnosis not present

## 2021-12-08 DIAGNOSIS — Z79899 Other long term (current) drug therapy: Secondary | ICD-10-CM | POA: Diagnosis not present

## 2021-12-08 DIAGNOSIS — M5416 Radiculopathy, lumbar region: Secondary | ICD-10-CM | POA: Diagnosis not present

## 2021-12-12 ENCOUNTER — Other Ambulatory Visit: Payer: Self-pay

## 2021-12-12 ENCOUNTER — Encounter: Payer: Self-pay | Admitting: Emergency Medicine

## 2021-12-12 ENCOUNTER — Inpatient Hospital Stay
Admission: EM | Admit: 2021-12-12 | Discharge: 2021-12-16 | DRG: 683 | Disposition: A | Discharge status: Home Health | Payer: Medicare HMO | Attending: Internal Medicine | Admitting: Internal Medicine

## 2021-12-12 ENCOUNTER — Emergency Department: Payer: Medicare HMO

## 2021-12-12 DIAGNOSIS — I25119 Atherosclerotic heart disease of native coronary artery with unspecified angina pectoris: Secondary | ICD-10-CM | POA: Diagnosis not present

## 2021-12-12 DIAGNOSIS — Z66 Do not resuscitate: Secondary | ICD-10-CM | POA: Diagnosis not present

## 2021-12-12 DIAGNOSIS — I252 Old myocardial infarction: Secondary | ICD-10-CM | POA: Diagnosis not present

## 2021-12-12 DIAGNOSIS — N3000 Acute cystitis without hematuria: Secondary | ICD-10-CM | POA: Diagnosis present

## 2021-12-12 DIAGNOSIS — Z8249 Family history of ischemic heart disease and other diseases of the circulatory system: Secondary | ICD-10-CM | POA: Diagnosis not present

## 2021-12-12 DIAGNOSIS — Z8572 Personal history of non-Hodgkin lymphomas: Secondary | ICD-10-CM

## 2021-12-12 DIAGNOSIS — R42 Dizziness and giddiness: Secondary | ICD-10-CM | POA: Diagnosis not present

## 2021-12-12 DIAGNOSIS — I2510 Atherosclerotic heart disease of native coronary artery without angina pectoris: Secondary | ICD-10-CM | POA: Diagnosis present

## 2021-12-12 DIAGNOSIS — K5909 Other constipation: Secondary | ICD-10-CM | POA: Diagnosis present

## 2021-12-12 DIAGNOSIS — Z20822 Contact with and (suspected) exposure to covid-19: Secondary | ICD-10-CM | POA: Diagnosis present

## 2021-12-12 DIAGNOSIS — R7881 Bacteremia: Secondary | ICD-10-CM | POA: Diagnosis present

## 2021-12-12 DIAGNOSIS — N179 Acute kidney failure, unspecified: Secondary | ICD-10-CM | POA: Diagnosis not present

## 2021-12-12 DIAGNOSIS — R0602 Shortness of breath: Secondary | ICD-10-CM | POA: Diagnosis not present

## 2021-12-12 DIAGNOSIS — Z7982 Long term (current) use of aspirin: Secondary | ICD-10-CM

## 2021-12-12 DIAGNOSIS — Z9841 Cataract extraction status, right eye: Secondary | ICD-10-CM

## 2021-12-12 DIAGNOSIS — I10 Essential (primary) hypertension: Secondary | ICD-10-CM | POA: Diagnosis not present

## 2021-12-12 DIAGNOSIS — E278 Other specified disorders of adrenal gland: Secondary | ICD-10-CM | POA: Diagnosis not present

## 2021-12-12 DIAGNOSIS — R0902 Hypoxemia: Secondary | ICD-10-CM | POA: Diagnosis not present

## 2021-12-12 DIAGNOSIS — W19XXXA Unspecified fall, initial encounter: Secondary | ICD-10-CM | POA: Diagnosis present

## 2021-12-12 DIAGNOSIS — L570 Actinic keratosis: Secondary | ICD-10-CM | POA: Diagnosis present

## 2021-12-12 DIAGNOSIS — S199XXA Unspecified injury of neck, initial encounter: Secondary | ICD-10-CM | POA: Diagnosis not present

## 2021-12-12 DIAGNOSIS — E861 Hypovolemia: Secondary | ICD-10-CM | POA: Diagnosis present

## 2021-12-12 DIAGNOSIS — Z9842 Cataract extraction status, left eye: Secondary | ICD-10-CM | POA: Diagnosis not present

## 2021-12-12 DIAGNOSIS — G894 Chronic pain syndrome: Secondary | ICD-10-CM | POA: Diagnosis not present

## 2021-12-12 DIAGNOSIS — R531 Weakness: Secondary | ICD-10-CM | POA: Diagnosis not present

## 2021-12-12 DIAGNOSIS — N390 Urinary tract infection, site not specified: Secondary | ICD-10-CM

## 2021-12-12 DIAGNOSIS — R5381 Other malaise: Secondary | ICD-10-CM | POA: Diagnosis not present

## 2021-12-12 DIAGNOSIS — B9620 Unspecified Escherichia coli [E. coli] as the cause of diseases classified elsewhere: Secondary | ICD-10-CM | POA: Diagnosis not present

## 2021-12-12 DIAGNOSIS — Z79899 Other long term (current) drug therapy: Secondary | ICD-10-CM | POA: Diagnosis not present

## 2021-12-12 DIAGNOSIS — Z888 Allergy status to other drugs, medicaments and biological substances status: Secondary | ICD-10-CM | POA: Diagnosis not present

## 2021-12-12 DIAGNOSIS — R4182 Altered mental status, unspecified: Secondary | ICD-10-CM | POA: Diagnosis not present

## 2021-12-12 DIAGNOSIS — S0990XA Unspecified injury of head, initial encounter: Secondary | ICD-10-CM | POA: Diagnosis not present

## 2021-12-12 DIAGNOSIS — R000 Tachycardia, unspecified: Secondary | ICD-10-CM | POA: Diagnosis not present

## 2021-12-12 DIAGNOSIS — Y92009 Unspecified place in unspecified non-institutional (private) residence as the place of occurrence of the external cause: Secondary | ICD-10-CM | POA: Diagnosis not present

## 2021-12-12 DIAGNOSIS — K5730 Diverticulosis of large intestine without perforation or abscess without bleeding: Secondary | ICD-10-CM | POA: Diagnosis not present

## 2021-12-12 DIAGNOSIS — N3289 Other specified disorders of bladder: Secondary | ICD-10-CM | POA: Diagnosis not present

## 2021-12-12 LAB — URINE DRUG SCREEN, QUALITATIVE (ARMC ONLY)
Amphetamines, Ur Screen: NOT DETECTED
Barbiturates, Ur Screen: NOT DETECTED
Benzodiazepine, Ur Scrn: NOT DETECTED
Cannabinoid 50 Ng, Ur ~~LOC~~: NOT DETECTED
Cocaine Metabolite,Ur ~~LOC~~: NOT DETECTED
MDMA (Ecstasy)Ur Screen: NOT DETECTED
Methadone Scn, Ur: NOT DETECTED
Opiate, Ur Screen: POSITIVE — AB
Phencyclidine (PCP) Ur S: NOT DETECTED
Tricyclic, Ur Screen: NOT DETECTED

## 2021-12-12 LAB — COMPREHENSIVE METABOLIC PANEL
ALT: 23 U/L (ref 0–44)
AST: 26 U/L (ref 15–41)
Albumin: 3.9 g/dL (ref 3.5–5.0)
Alkaline Phosphatase: 68 U/L (ref 38–126)
Anion gap: 9 (ref 5–15)
BUN: 33 mg/dL — ABNORMAL HIGH (ref 8–23)
CO2: 26 mmol/L (ref 22–32)
Calcium: 8.9 mg/dL (ref 8.9–10.3)
Chloride: 103 mmol/L (ref 98–111)
Creatinine, Ser: 1.6 mg/dL — ABNORMAL HIGH (ref 0.61–1.24)
GFR, Estimated: 40 mL/min — ABNORMAL LOW (ref >60)
Glucose, Bld: 128 mg/dL — ABNORMAL HIGH (ref 70–99)
Potassium: 4.2 mmol/L (ref 3.5–5.1)
Sodium: 138 mmol/L (ref 135–145)
Total Bilirubin: 2.4 mg/dL — ABNORMAL HIGH (ref 0.3–1.2)
Total Protein: 7.4 g/dL (ref 6.5–8.1)

## 2021-12-12 LAB — URINALYSIS, COMPLETE (UACMP) WITH MICROSCOPIC
Bilirubin Urine: NEGATIVE
Glucose, UA: NEGATIVE mg/dL
Ketones, ur: 5 mg/dL — AB
Nitrite: POSITIVE — AB
Protein, ur: 100 mg/dL — AB
Specific Gravity, Urine: 1.014 (ref 1.005–1.030)
Squamous Epithelial / HPF: NONE SEEN (ref 0–5)
WBC, UA: >50 WBC/hpf — ABNORMAL HIGH (ref 0–5)
pH: 5 (ref 5.0–8.0)

## 2021-12-12 LAB — TROPONIN I (HIGH SENSITIVITY): Troponin I (High Sensitivity): 30 ng/L — ABNORMAL HIGH (ref <18)

## 2021-12-12 LAB — CBC
HCT: 39 % (ref 39.0–52.0)
Hemoglobin: 13.1 g/dL (ref 13.0–17.0)
MCH: 31.7 pg (ref 26.0–34.0)
MCHC: 33.6 g/dL (ref 30.0–36.0)
MCV: 94.4 fL (ref 80.0–100.0)
Platelets: 146 10*3/uL — ABNORMAL LOW (ref 150–400)
RBC: 4.13 MIL/uL — ABNORMAL LOW (ref 4.22–5.81)
RDW: 12.4 % (ref 11.5–15.5)
WBC: 11.1 10*3/uL — ABNORMAL HIGH (ref 4.0–10.5)
nRBC: 0 % (ref 0.0–0.2)

## 2021-12-12 LAB — RESP PANEL BY RT-PCR (FLU A&B, COVID) ARPGX2
Influenza A by PCR: NEGATIVE
Influenza B by PCR: NEGATIVE
SARS Coronavirus 2 by RT PCR: NEGATIVE

## 2021-12-12 LAB — LIPASE, BLOOD: Lipase: 22 U/L (ref 11–51)

## 2021-12-12 LAB — CBG MONITORING, ED: Glucose-Capillary: 107 mg/dL — ABNORMAL HIGH (ref 70–99)

## 2021-12-12 LAB — LACTIC ACID, PLASMA
Lactic Acid, Venous: 1.8 mmol/L (ref 0.5–1.9)
Lactic Acid, Venous: 1.1 mmol/L (ref 0.5–1.9)

## 2021-12-12 LAB — CK: Total CK: 125 U/L (ref 49–397)

## 2021-12-12 MED ORDER — ASPIRIN EC 81 MG PO TBEC
81.0000 mg | DELAYED_RELEASE_TABLET | Freq: Every day | ORAL | Status: DC
  Administered 2021-12-12 – 2021-12-15 (×4): 81 mg via ORAL
  Filled 2021-12-12 (×4): qty 1

## 2021-12-12 MED ORDER — PANTOPRAZOLE SODIUM 40 MG PO TBEC
40.0000 mg | DELAYED_RELEASE_TABLET | Freq: Two times a day (BID) | ORAL | Status: DC
  Administered 2021-12-12 – 2021-12-16 (×8): 40 mg via ORAL
  Filled 2021-12-12 (×8): qty 1

## 2021-12-12 MED ORDER — ONDANSETRON HCL 4 MG/2ML IJ SOLN
4.0000 mg | Freq: Once | INTRAMUSCULAR | Status: AC
  Administered 2021-12-12: 4 mg via INTRAVENOUS
  Filled 2021-12-12: qty 2

## 2021-12-12 MED ORDER — SODIUM CHLORIDE 0.9 % IV BOLUS
1000.0000 mL | Freq: Once | INTRAVENOUS | Status: AC
  Administered 2021-12-12: 1000 mL via INTRAVENOUS

## 2021-12-12 MED ORDER — LACTATED RINGERS IV SOLN: INTRAVENOUS | Status: AC

## 2021-12-12 MED ORDER — IOHEXOL 300 MG/ML  SOLN
100.0000 mL | Freq: Once | INTRAMUSCULAR | Status: AC | PRN
  Administered 2021-12-12: 100 mL via INTRAVENOUS
  Filled 2021-12-12: qty 100

## 2021-12-12 MED ORDER — ACETAMINOPHEN 325 MG PO TABS
650.0000 mg | ORAL_TABLET | ORAL | Status: AC
  Administered 2021-12-12 – 2021-12-13 (×3): 650 mg via ORAL
  Filled 2021-12-12 (×5): qty 2

## 2021-12-12 MED ORDER — ENOXAPARIN SODIUM 40 MG/0.4ML IJ SOSY
40.0000 mg | PREFILLED_SYRINGE | INTRAMUSCULAR | Status: DC
  Administered 2021-12-12 – 2021-12-15 (×4): 40 mg via SUBCUTANEOUS
  Filled 2021-12-12 (×4): qty 0.4

## 2021-12-12 MED ORDER — ONDANSETRON HCL 4 MG PO TABS: 4.0000 mg | ORAL_TABLET | Freq: Four times a day (QID) | ORAL | Status: DC | PRN

## 2021-12-12 MED ORDER — CEFTRIAXONE SODIUM 1 G IJ SOLR
1.0000 g | Freq: Once | INTRAMUSCULAR | Status: AC
  Administered 2021-12-12: 1 g via INTRAVENOUS
  Filled 2021-12-12: qty 10

## 2021-12-12 MED ORDER — PRAVASTATIN SODIUM 20 MG PO TABS
40.0000 mg | ORAL_TABLET | Freq: Every day | ORAL | Status: DC
  Administered 2021-12-12 – 2021-12-15 (×4): 40 mg via ORAL
  Filled 2021-12-12 (×4): qty 2

## 2021-12-12 MED ORDER — ONDANSETRON HCL 4 MG/2ML IJ SOLN
4.0000 mg | Freq: Four times a day (QID) | INTRAMUSCULAR | Status: DC | PRN
  Administered 2021-12-12: 4 mg via INTRAVENOUS
  Filled 2021-12-12: qty 2

## 2021-12-12 MED ORDER — MELATONIN 5 MG PO TABS: 10.0000 mg | ORAL_TABLET | Freq: Every evening | ORAL | Status: DC | PRN

## 2021-12-12 MED ORDER — SODIUM CHLORIDE 0.9 % IV SOLN
1.0000 g | INTRAVENOUS | Status: DC
  Administered 2021-12-13: 1 g via INTRAVENOUS
  Filled 2021-12-12: qty 1
  Filled 2021-12-12: qty 10

## 2021-12-12 NOTE — Assessment & Plan Note (Signed)
Verified DNR/DNI status with patient at bedside. Witnessed by his fiance Pat. ?

## 2021-12-12 NOTE — ED Triage Notes (Signed)
Pt ems from home for AMS, dark urine, dizziness. Pt found on floor this am - pt states that he was going to BR and fell.  ?

## 2021-12-12 NOTE — Assessment & Plan Note (Signed)
Weakness likely due to UTI and AKI. Will have PT see patient tomorrow.

## 2021-12-12 NOTE — H&P (Signed)
History and Physical    Travis Schultz JXB:[id number] DOB: 05-02-1926 DOA: 12/12/2021  DOS: the patient was seen and examined on 12/12/2021  PCP: [name], MD   Patient coming from: Home  I have personally briefly reviewed patient's old medical records in [place]  CC: fall at home HPI: 88 year old male with a history of hypertension,Coronary disease, chronic pain syndrome who presents the ER today after a fall at home.  Patient was unable to get up.  He started having symptoms last night.  Patient's fianc Fraser Din states that patient got up several times last night to go use the bathroom.  This is very unusual for him.  When he was trying to get out of bed on these occasions, he fell and was unable to get up.  Patient's had diarrhea for last couple days.  He wears adult diapers.  He states that he has been sitting in wet diapers.  Patient does not have a history of UTIs.  Fiance found him on the floor today. Denies any head trauma. C/o of left buttock pain when he hit the ground.  Fiance states pt has been falling down more frequently in the last several weeks.  Pt get ESI back injections      ED Course: UA positive for nitrites, LE, pyuria. Mild AKI on CMP.  Review of Systems:  Review of Systems  Constitutional:  Positive for chills.  HENT: Negative.    Eyes: Negative.   Gastrointestinal:  Positive for diarrhea.  Genitourinary:  Positive for frequency.  Musculoskeletal:        Left buttock pain  Skin: Negative.   Neurological: Negative.   Endo/Heme/Allergies: Negative.   Psychiatric/Behavioral: Negative.    All other systems reviewed and are negative.  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 He has never used smokeless tobacco. He reports that he does not drink alcohol and does not use drugs.  Allergies  Allergen Reactions   Phenergan [Promethazine Hcl]     Altered mental status    Family History  Problem Relation Age of Onset   Heart disease Father    Heart disease Brother     Prior to Admission medications   Medication Sig Start Date End Date Taking? Authorizing Provider  acetaminophen (TYLENOL) 325 MG tablet Take 325 mg by mouth at bedtime.    [provider]  Apoaequorin (PREVAGEN) 10 MG CAPS Take 1 capsule by mouth daily.    [provider]  aspirin 81 MG tablet Take 81 mg by mouth at bedtime.    [provider]  docusate sodium (COLACE) 100 MG capsule Take 1 capsule by mouth as needed.    [provider]  Multiple Vitamins-Minerals (ICAPS AREDS 2) CAPS Take 1 capsule by mouth 2 (two) times daily.    [provider]  multivitamin-iron-minerals-folic acid (CENTRUM) chewable tablet Chew 1 tablet by mouth daily.    [provider]  naproxen sodium (ALEVE) 220 MG tablet  Take 220 mg by mouth daily.    [provider]  pantoprazole (PROTONIX) 40 MG tablet Take 1 tablet (40 mg total) by mouth 2 (two) times daily. 01/06/21   Lorella Nimrod, MD  polyethylene glycol (MIRALAX / GLYCOLAX) 17 g packet Take 17 g by mouth daily. 01/06/21   Lorella Nimrod, MD  pravastatin (PRAVACHOL) 40 MG tablet Take 40 mg by mouth at bedtime. 12/11/17   [provider]    Physical Exam: Vitals:   12/12/21 1644 12/12/21 1645 12/12/21 1646 12/12/21 1837  BP: (!) 146/75 (!) 146/76  (!) 142/76  Pulse: (!) 109 (!) 112  100  Resp: '20 18  16  '$ Temp: 99.5 F (37.5 C)     TempSrc: Oral     SpO2: 100% 97%  97%  Weight:   83.5 kg   Height:   6' (1.829 m)     Physical  Exam Vitals and nursing note reviewed.  Constitutional:      General: He is not in acute distress.    Appearance: Normal appearance. He is not ill-appearing, toxic-appearing or diaphoretic.  HENT:     Head: Normocephalic and atraumatic.     Nose: Nose normal. No rhinorrhea.  Cardiovascular:     Rate and Rhythm: Regular rhythm. Tachycardia present.     Pulses: Normal pulses.  Pulmonary:     Effort: Pulmonary effort is normal.     Breath sounds: No wheezing or rales.  Chest:     Chest wall: No tenderness.  Abdominal:     General: Bowel sounds are normal.     Palpations: Abdomen is soft.     Tenderness: There is abdominal tenderness in the suprapubic area.  Musculoskeletal:     Right lower leg: No edema.     Left lower leg: No edema.  Skin:    General: Skin is warm and dry.     Capillary Refill: Capillary refill takes less than 2 seconds.  Neurological:     General: No focal deficit present.     Mental Status: He is alert and oriented to person, place, and time.     Labs on Admission: I have personally reviewed following labs and imaging studies  CBC: Recent Labs  Lab 12/12/21 1651  WBC 11.1*  HGB 13.1  HCT 39.0  MCV 94.4  PLT 784*   Basic Metabolic Panel: Recent Labs  Lab 12/12/21 1651  NA 138  K 4.2  CL 103  CO2 26  GLUCOSE 128*  BUN 33*  CREATININE 1.60*  CALCIUM 8.9   GFR: Estimated Creatinine Clearance: 31.7 mL/min (A) (by C-G formula based on SCr of 1.6 mg/dL (H)). Liver Function Tests: Recent Labs  Lab 12/12/21 1651  AST 26  ALT 23  ALKPHOS 68  BILITOT 2.4*  PROT 7.4  ALBUMIN 3.9   Recent Labs  Lab 12/12/21 1651  LIPASE 22   No results for input(s): AMMONIA in the last 168 hours. Coagulation Profile: No results for input(s): INR, PROTIME in the last 168 hours. Cardiac Enzymes: Recent Labs  Lab 12/12/21 1651  CKTOTAL 125  TROPONINIHS 30*   BNP (last 3 results) No results for input(s): PROBNP in the last 8760 hours. HbA1C: No  results for input(s): HGBA1C in the last 72 hours. CBG: No results for input(s): GLUCAP in the last 168 hours. Lipid Profile: No results for input(s): CHOL, HDL, LDLCALC, TRIG, CHOLHDL, LDLDIRECT in the last 72 hours. Thyroid Function Tests: No results for input(s): TSH, T4TOTAL, FREET4, T3FREE, THYROIDAB  in the last 72 hours. Anemia Panel: No results for input(s): VITAMINB12, FOLATE, FERRITIN, TIBC, IRON, RETICCTPCT in the last 72 hours. Urine analysis:    Component Value Date/Time   COLORURINE YELLOW (A) 12/12/2021 1653   APPEARANCEUR CLOUDY (A) 12/12/2021 1653   LABSPEC 1.014 12/12/2021 1653   PHURINE 5.0 12/12/2021 1653   GLUCOSEU NEGATIVE 12/12/2021 1653   HGBUR SMALL (A) 12/12/2021 1653   BILIRUBINUR NEGATIVE 12/12/2021 1653   KETONESUR 5 (A) 12/12/2021 1653   PROTEINUR 100 (A) 12/12/2021 1653   NITRITE POSITIVE (A) 12/12/2021 1653   LEUKOCYTESUR LARGE (A) 12/12/2021 1653    Radiological Exams on Admission: I have personally reviewed images CT HEAD WO CONTRAST (5MM)  Result Date: 12/12/2021 CLINICAL DATA:  Head and neck trauma. Fall. Altered mental status. Dizziness. EXAM: CT HEAD WITHOUT CONTRAST CT CERVICAL SPINE WITHOUT CONTRAST TECHNIQUE: Multidetector CT imaging of the head and cervical spine was performed following the standard protocol without intravenous contrast. Multiplanar CT image reconstructions of the cervical spine were also generated. RADIATION DOSE REDUCTION: This exam was performed according to the departmental dose-optimization program which includes automated exposure control, adjustment of the mA and/or kV according to patient size and/or use of iterative reconstruction technique. COMPARISON:  Head CT 05/13/2010.  Neck CTA 02/17/2010. FINDINGS: CT HEAD FINDINGS Brain: There is no evidence of an acute infarct, intracranial hemorrhage, mass, midline shift, or extra-axial fluid collection. Generalized cerebral atrophy is mild for age. Hypodensities in the cerebral  white matter bilaterally are nonspecific but compatible with mild chronic small vessel ischemic disease. Vascular: Calcified atherosclerosis at the skull base. Skull: No fracture or suspicious osseous lesion. Sinuses/Orbits: Mild mucosal thickening in the right ethmoid and right maxillary sinuses. No significant mastoid fluid. Bilateral cataract extraction. Other: None. CT CERVICAL SPINE FINDINGS Alignment: Straightening of the normal cervical lordosis. Trace anterolisthesis of C6 on C7 and C7 on T1, likely degenerative and facet mediated. Skull base and vertebrae: No acute fracture or suspicious osseous lesion. Soft tissues and spinal canal: No prevertebral fluid or swelling. No visible canal hematoma. Disc levels: Advanced disc degeneration including severe disc space narrowing and prominent degenerative endplate changes at V4-2, C4-5, and C5-6 with at least moderate spinal stenosis at these levels. Advanced cervical facet arthrosis, overall left worse than right. Severe multilevel neural foraminal stenosis. Upper chest: Clear lung apices. Other: Mild-to-moderate calcific atherosclerosis at the left carotid bifurcation. IMPRESSION: 1. No evidence of acute intracranial abnormality. 2. No acute cervical spine fracture. 3. Advanced cervical disc and facet degeneration with multilevel spinal and neural foraminal stenosis. Electronically Signed   By: Logan Bores M.D.   On: 12/12/2021 18:46   CT Cervical Spine Wo Contrast  Result Date: 12/12/2021 CLINICAL DATA:  Head and neck trauma. Fall. Altered mental status. Dizziness. EXAM: CT HEAD WITHOUT CONTRAST CT CERVICAL SPINE WITHOUT CONTRAST TECHNIQUE: Multidetector CT imaging of the head and cervical spine was performed following the standard protocol without intravenous contrast. Multiplanar CT image reconstructions of the cervical spine were also generated. RADIATION DOSE REDUCTION: This exam was performed according to the departmental dose-optimization program  which includes automated exposure control, adjustment of the mA and/or kV according to patient size and/or use of iterative reconstruction technique. COMPARISON:  Head CT 05/13/2010.  Neck CTA 02/17/2010. FINDINGS: CT HEAD FINDINGS Brain: There is no evidence of an acute infarct, intracranial hemorrhage, mass, midline shift, or extra-axial fluid collection. Generalized cerebral atrophy is mild for age. Hypodensities in the cerebral white matter bilaterally are nonspecific but compatible with  mild chronic small vessel ischemic disease. Vascular: Calcified atherosclerosis at the skull base. Skull: No fracture or suspicious osseous lesion. Sinuses/Orbits: Mild mucosal thickening in the right ethmoid and right maxillary sinuses. No significant mastoid fluid. Bilateral cataract extraction. Other: None. CT CERVICAL SPINE FINDINGS Alignment: Straightening of the normal cervical lordosis. Trace anterolisthesis of C6 on C7 and C7 on T1, likely degenerative and facet mediated. Skull base and vertebrae: No acute fracture or suspicious osseous lesion. Soft tissues and spinal canal: No prevertebral fluid or swelling. No visible canal hematoma. Disc levels: Advanced disc degeneration including severe disc space narrowing and prominent degenerative endplate changes at B3-5, C4-5, and C5-6 with at least moderate spinal stenosis at these levels. Advanced cervical facet arthrosis, overall left worse than right. Severe multilevel neural foraminal stenosis. Upper chest: Clear lung apices. Other: Mild-to-moderate calcific atherosclerosis at the left carotid bifurcation. IMPRESSION: 1. No evidence of acute intracranial abnormality. 2. No acute cervical spine fracture. 3. Advanced cervical disc and facet degeneration with multilevel spinal and neural foraminal stenosis. Electronically Signed   By: Logan Bores M.D.   On: 12/12/2021 18:46   CT ABDOMEN PELVIS W CONTRAST  Result Date: 12/12/2021 CLINICAL DATA:  Fall in bathroom this  morning. Dark urine noted. Dizziness and altered mental status. EXAM: CT ABDOMEN AND PELVIS WITH CONTRAST TECHNIQUE: Multidetector CT imaging of the abdomen and pelvis was performed using the standard protocol following bolus administration of intravenous contrast. RADIATION DOSE REDUCTION: This exam was performed according to the departmental dose-optimization program which includes automated exposure control, adjustment of the mA and/or kV according to patient size and/or use of iterative reconstruction technique. CONTRAST:  152m OMNIPAQUE IOHEXOL 300 MG/ML  SOLN COMPARISON:  01/04/2021 FINDINGS: Lower chest:  Unremarkable. Hepatobiliary: No hepatic laceration or mass identified. Prior cholecystectomy. No evidence of biliary obstruction. Pancreas: Diffuse fatty replacement of pancreas again noted. No evidence of pancreatic mass or pancreatitis. Spleen: No evidence of splenic laceration. Adrenal/Urinary Tract: No hemorrhage or parenchymal lacerations identified. No evidence of mass or hydronephrosis. Right renal sinus cyst again demonstrated. Mildly enlarged urinary bladder again shows mild diffuse wall thickening, which may be due to neurogenic bladder or chronic cystitis. Stomach/Bowel: Unopacified bowel loops are unremarkable in appearance. No evidence of hemoperitoneum. Diverticulosis is seen mainly involving the sigmoid colon, however there is no evidence of diverticulitis. Vascular/Lymphatic: No evidence of abdominal aortic injury. Aortic atherosclerotic calcification noted. No pathologically enlarged lymph nodes identified. Reproductive: Prior radical prostatectomy again noted. No evidence of recurrent mass in the surgical bed. Other:  None. Musculoskeletal: No acute fractures or suspicious bone lesions identified. IMPRESSION: No evidence of traumatic injury or other acute findings. Stable mildly enlarged urinary bladder with mild diffuse wall thickening, which may be due to neurogenic bladder or chronic  cystitis. Prior prostatectomy.  No evidence of metastatic disease. Colonic diverticulosis, without radiographic evidence of diverticulitis. Aortic Atherosclerosis (ICD10-I70.0). Electronically Signed   By: JMarlaine HindM.D.   On: 12/12/2021 18:48   DG Chest Portable 1 View  Result Date: 12/12/2021 CLINICAL DATA:  Shortness of breath, altered mental status, dark urine, dizziness, found on floor this morning, patient states he was going to the bathroom and fell EXAM: PORTABLE CHEST 1 VIEW COMPARISON:  Portable exam 1720 hours compared to 01/04/2021 FINDINGS: Normal heart size post CABG. Mediastinal contours and pulmonary vascularity normal. Lungs clear. No infiltrate, pleural effusion, or pneumothorax. Osseous structures unremarkable. IMPRESSION: No acute abnormalities. Electronically Signed   By: MLavonia DanaM.D.   On: 12/12/2021 17:28  EKG: I have personally reviewed EKG: sinus tachycardia   Assessment/Plan Principal Problem:   Acute cystitis without hematuria Active Problems:   AKI (acute kidney injury) ([place])   Fall at home, initial encounter   Chronic pain syndrome   CAD (coronary artery disease)   DNR (do not resuscitate)    Assessment and Plan: * Acute cystitis without hematuria Admit to observation. IV rocephin. Urine and blood cx obtained. uti likely due to pt wearing adult diapers. Pt's fiance reminded patient that he has been having diarrhea for the last few days. UTI possibly related to being in soiled diaper.  AKI (acute kidney injury) ([place]) Continue with gentle IVF. Hold nephrotoxic agents. Repeat BMP in AM.  Fall at home, initial encounter Weakness likely due to UTI and AKI. Will have PT see patient tomorrow.  CAD (coronary artery disease) Stable.  Chronic pain syndrome Stable. Scheduled tylenol.  DNR (do not resuscitate) Verified DNR/DNI status with patient at bedside. Witnessed by his fiance Pat.   DVT prophylaxis: SQ Heparin Code Status: DNR/DNI(Do NOT  Intubate) verified with patient and his fiance Pat Family Communication: discussed with pt and fiance pat at bedside  Disposition Plan: return home  Consults called: none  Admission status: Observation, Med-Surg   Kristopher Oppenheim, DO [facility] 12/12/2021, 8:12 PM

## 2021-12-12 NOTE — Assessment & Plan Note (Signed)
Stable. Scheduled tylenol. ?

## 2021-12-12 NOTE — Subjective & Objective (Signed)
CC: fall at home ?HPI: ?88 year old male with a history of hypertension,Coronary disease, chronic pain syndrome who presents the ER today after a fall at home.  Patient was unable to get up.  He started having symptoms last night.  Patient's fianc? Fraser Din states that patient got up several times last night to go use the bathroom.  This is very unusual for him.  When he was trying to get out of bed on these occasions, he fell and was unable to get up.  Patient's had diarrhea for last couple days.  He wears adult diapers.  He states that he has been sitting in wet diapers.  Patient does not have a history of UTIs. ? ?Fiance found him on the floor today. Denies any head trauma. C/o of left buttock pain when he hit the ground. ? ?Fiance states pt has been falling down more frequently in the last several weeks. ? ?Pt get ESI back injections  ? ? ?

## 2021-12-12 NOTE — ED Provider Notes (Signed)
? ?[facility] ?Provider Note ? ? ? Event Date/Time  ? First MD Initiated Contact with Patient 12/12/21 1635   ?  (approximate) ? ? ?History  ? ?Altered Mental Status ? ? ?HPI ? ?Travis Schultz is a 88 y.o. male with past medical history of coronary artery disease, chronic constipation lymphoma he presents with generalized weakness.  Patient tells me that he was at a birthday party for his fianc?e and afterward started feeling generally weak.  Got out of bed to use the bathroom and he fell to the ground.  He does not recall if he syncopized or lost consciousness.  He tried several times to get off the ground but was ultimately unable to.  He stayed the night on the ground.  Did not hit his head.  He endorses some mild lower abdominal pain and is chronically constipated.  Denies nausea vomiting fevers chills headache or chest pain.  Continues to feel just ongoing fatigue. ?  ? ?Past Medical History:  ?Diagnosis Date  ? Actinic keratosis   ? 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 Myocardial infarction ([place]) 06/17/2009  ? 5 bypass's  ? ? ?Patient Active Problem List  ? Diagnosis Date Noted  ? Belching   ? Constipation   ? Abdominal pain 01/04/2021  ? Lumbar facet arthropathy 04/16/2018  ? 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 Lumbar degenerative disc disease 04/16/2018  ? Bilateral primary osteoarthritis of knee 04/16/2018  ? Chronic pain syndrome 04/16/2018  ? 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 Chest pain 1991  ? CAD (coronary artery disease) 1991  ? ? ? ?Physical Exam  ?Triage Vital Signs: ?ED Triage Vitals  ?Enc Vitals Group  ?   BP 12/12/21 1644 (!) 146/75  ?   Pulse Rate 12/12/21 1644 (!) 109  ?   Resp 12/12/21 1644 20  ?   Temp 12/12/21 1644 99.5 ?F (37.5 ?C)  ?   Temp Source 12/12/21 1644 Oral  ?   SpO2 12/12/21 1644 100 %  ?   Weight 12/12/21 1646 184 lb 1.4 oz (83.5 kg)  ?   Height 12/12/21 1646 6' (1.829 m)  ?   Head Circumference --   ?   Peak Flow --   ?   Pain Score  12/12/21 1645 0  ?   Pain Loc --   ?   Pain Edu? --   ?   Excl. in [place]? --   ? ? ?Most recent vital signs: ?Vitals:  ? 12/12/21 1645 12/12/21 1837  ?BP: (!) 146/76 (!) 142/76  ?Pulse: (!) 112 100  ?Resp: 18 16  ?Temp:    ?SpO2: 97% 97%  ? ? ? ?General: Awake, no distress.  ?CV:  Good peripheral perfusion.  Tachycardic ?Resp:  Normal effort.  ?Abd:  No distention.  Mild tenderness in the bilateral lower quadrants ?Neuro:             Awake, Alert, Oriented x 2-thinks the year is 1993 ?Other:  PERRLA, EOMI ?  5 out of 5 strength in the bilateral upper and lower extremities ? ?Very dry mucous membranes ? ? ?ED Results / Procedures / Treatments  ?Labs ?(all labs ordered are listed, but only abnormal results are displayed) ?Labs Reviewed  ?COMPREHENSIVE METABOLIC PANEL - Abnormal; Notable for the following components:  ?    Result Value  ? Glucose, Bld 128 (*)   ? BUN 33 (*)   ? Creatinine, Ser 1.60 (*)   ?  Total Bilirubin 2.4 (*)   ? GFR, Estimated 40 (*)   ? All other components within normal limits  ?CBC - Abnormal; Notable for the following components:  ? WBC 11.1 (*)   ? RBC 4.13 (*)   ? Platelets 146 (*)   ? All other components within normal limits  ?URINE DRUG SCREEN, QUALITATIVE ([facility] ONLY) - Abnormal; Notable for the following components:  ? Opiate, Ur Screen POSITIVE (*)   ? All other components within normal limits  ?URINALYSIS, COMPLETE (UACMP) WITH MICROSCOPIC - Abnormal; Notable for the following components:  ? Color, Urine YELLOW (*)   ? APPearance CLOUDY (*)   ? Hgb urine dipstick SMALL (*)   ? Ketones, ur 5 (*)   ? Protein, ur 100 (*)   ? Nitrite POSITIVE (*)   ? Leukocytes,Ua LARGE (*)   ? WBC, UA >50 (*)   ? Bacteria, UA RARE (*)   ? All other components within normal limits  ?TROPONIN I (HIGH SENSITIVITY) - Abnormal; Notable for the following components:  ? Troponin I (High Sensitivity) 30 (*)   ? All other components within normal limits  ?CULTURE, BLOOD (ROUTINE X 2)  ?CULTURE, BLOOD (ROUTINE X 2)   ?URINE CULTURE  ?LACTIC ACID, PLASMA  ?CK  ?LIPASE, BLOOD  ?LACTIC ACID, PLASMA  ?CBG MONITORING, ED  ? ? ? ?EKG ? ?EKG reviewed by myself was a right bundle branch block with left axis deviation, sinus tachycardia ? ? ?RADIOLOGY ?I reviewed the CXR which does not show any acute cardiopulmonary process; agree with radiology report  ? ?I reviewed the CT scan of the brain which does not show any acute intracranial process; agree with radiology report  ? ?I reviewed the CT of the cervical spine which does not show any acute fracture or misalignment; agree with radiology report  ? ? ? ?PROCEDURES: ? ?Critical Care performed: No ? ?.1-3 Lead EKG Interpretation ?Performed by: Rada Hay, MD ?Authorized by: Rada Hay, MD  ? ?  Interpretation: abnormal   ?  ECG rate assessment: tachycardic   ?  Rhythm: sinus tachycardia   ?  Ectopy: none   ?  Conduction: normal   ? ?The patient is on the cardiac monitor to evaluate for evidence of arrhythmia and/or significant heart rate changes. ? ? ?MEDICATIONS ORDERED IN ED: ?Medications  ?cefTRIAXone (ROCEPHIN) 1 g in sodium chloride 0.9 % 100 mL IVPB (1 g Intravenous New Bag/Given 12/12/21 1839)  ?sodium chloride 0.9 % bolus 1,000 mL (0 mLs Intravenous Stopped 12/12/21 1832)  ?iohexol (OMNIPAQUE) 300 MG/ML solution 100 mL (100 mLs Intravenous Contrast Given 12/12/21 1820)  ? ? ? ?IMPRESSION / MDM / ASSESSMENT AND PLAN / ED COURSE  ?I reviewed the triage vital signs and the nursing notes. ?             ?               ? ?Differential diagnosis includes, but is not limited to, sepsis, UTI, pneumonia, metabolic abnormality, dehydration, rhabdomyolysis, ACS ? ?Patient is a 88 year old male who presents with generalized weakness.  He had a fall overnight when trying to use the bathroom unsure if it was syncope or mechanical but was unable to get up and this is not normal for him.  Prior to that last night he started having some lower abdominal pain and generalized weakness.   He otherwise has no other focal symptoms including no symptoms of ischemia including shortness of breath or  chest pain.  No fevers dysuria vomiting.  His only complaint is chronic constipation and some lower abdominal discomfort.  Patient is mildly tachycardic.  He appears very dry on exam.  He is a little bit disoriented, thinks is 63 but otherwise is able to answer questions.  His neurologic exam is nonfocal ? ?Patient CT head and C-spine are negative for acute abnormality.  Chest x-ray is without infiltrate.  UA is consistent with infection urine culture sent.  Labs are notable for mild leukocytosis AKI and mildly elevated bilirubin.  It is normal.  Troponin also mildly elevated at 30 will repeat.  Patient's EKG shows a right bundle without ischemic change and he is not having chest pain suspect that this is demand in the setting of his illness.  Ultimately given his age generalized weakness and UTI will admit.  Given Rocephin.  Discussed with hospice for admission ?  ? ? ?FINAL CLINICAL IMPRESSION(S) / ED DIAGNOSES  ? ?Final diagnoses:  ?AKI (acute kidney injury) ([place])  ?Urinary tract infection without hematuria, site unspecified  ? ? ? ?Rx / DC Orders  ? ?ED Discharge Orders   ? ? None  ? ?  ? ? ? ?Note:  This document was prepared using Dragon voice recognition software and may include unintentional dictation errors. ?  ?Rada Hay, MD ?12/12/21 1856 ? ?

## 2021-12-12 NOTE — Assessment & Plan Note (Signed)
Stable

## 2021-12-12 NOTE — Assessment & Plan Note (Signed)
Admit to observation. IV rocephin. Urine and blood cx obtained. uti likely due to pt wearing adult diapers. Pt's fiance reminded patient that he has been having diarrhea for the last few days. UTI possibly related to being in soiled diaper.

## 2021-12-12 NOTE — Assessment & Plan Note (Addendum)
-   baseline creatinine ~ 1 - patient presents with increase in creat >0.3 mg/dL above baseline, creat increase >1.5x baseline presumed to have occurred within past 7 days PTA -Creatinine 1.6 on admission.  Presumed hypovolemic/prerenal -Responded well to fluids, creatinine 1.2 at discharge

## 2021-12-13 DIAGNOSIS — N3000 Acute cystitis without hematuria: Secondary | ICD-10-CM | POA: Diagnosis present

## 2021-12-13 DIAGNOSIS — I252 Old myocardial infarction: Secondary | ICD-10-CM | POA: Diagnosis not present

## 2021-12-13 DIAGNOSIS — B9620 Unspecified Escherichia coli [E. coli] as the cause of diseases classified elsewhere: Secondary | ICD-10-CM | POA: Diagnosis present

## 2021-12-13 DIAGNOSIS — R7881 Bacteremia: Secondary | ICD-10-CM | POA: Diagnosis present

## 2021-12-13 DIAGNOSIS — Z7982 Long term (current) use of aspirin: Secondary | ICD-10-CM | POA: Diagnosis not present

## 2021-12-13 DIAGNOSIS — N179 Acute kidney failure, unspecified: Secondary | ICD-10-CM | POA: Diagnosis present

## 2021-12-13 DIAGNOSIS — Z9841 Cataract extraction status, right eye: Secondary | ICD-10-CM | POA: Diagnosis not present

## 2021-12-13 DIAGNOSIS — Z79899 Other long term (current) drug therapy: Secondary | ICD-10-CM | POA: Diagnosis not present

## 2021-12-13 DIAGNOSIS — L570 Actinic keratosis: Secondary | ICD-10-CM | POA: Diagnosis present

## 2021-12-13 DIAGNOSIS — K5909 Other constipation: Secondary | ICD-10-CM | POA: Diagnosis present

## 2021-12-13 DIAGNOSIS — I10 Essential (primary) hypertension: Secondary | ICD-10-CM | POA: Diagnosis present

## 2021-12-13 DIAGNOSIS — I2510 Atherosclerotic heart disease of native coronary artery without angina pectoris: Secondary | ICD-10-CM | POA: Diagnosis present

## 2021-12-13 DIAGNOSIS — G894 Chronic pain syndrome: Secondary | ICD-10-CM | POA: Diagnosis present

## 2021-12-13 DIAGNOSIS — Z66 Do not resuscitate: Secondary | ICD-10-CM | POA: Diagnosis present

## 2021-12-13 DIAGNOSIS — R5381 Other malaise: Secondary | ICD-10-CM | POA: Diagnosis not present

## 2021-12-13 DIAGNOSIS — W19XXXA Unspecified fall, initial encounter: Secondary | ICD-10-CM | POA: Diagnosis present

## 2021-12-13 DIAGNOSIS — Y92009 Unspecified place in unspecified non-institutional (private) residence as the place of occurrence of the external cause: Secondary | ICD-10-CM | POA: Diagnosis not present

## 2021-12-13 DIAGNOSIS — R4182 Altered mental status, unspecified: Secondary | ICD-10-CM | POA: Diagnosis present

## 2021-12-13 DIAGNOSIS — E861 Hypovolemia: Secondary | ICD-10-CM | POA: Diagnosis present

## 2021-12-13 DIAGNOSIS — Z20822 Contact with and (suspected) exposure to covid-19: Secondary | ICD-10-CM | POA: Diagnosis present

## 2021-12-13 DIAGNOSIS — Z9842 Cataract extraction status, left eye: Secondary | ICD-10-CM | POA: Diagnosis not present

## 2021-12-13 DIAGNOSIS — Z8249 Family history of ischemic heart disease and other diseases of the circulatory system: Secondary | ICD-10-CM | POA: Diagnosis not present

## 2021-12-13 DIAGNOSIS — Z8572 Personal history of non-Hodgkin lymphomas: Secondary | ICD-10-CM | POA: Diagnosis not present

## 2021-12-13 DIAGNOSIS — Z888 Allergy status to other drugs, medicaments and biological substances status: Secondary | ICD-10-CM | POA: Diagnosis not present

## 2021-12-13 LAB — BLOOD CULTURE ID PANEL (REFLEXED) - BCID2

## 2021-12-13 LAB — CBC WITH DIFFERENTIAL/PLATELET
Abs Immature Granulocytes: 0.03 10*3/uL (ref 0.00–0.07)
Basophils Absolute: 0 10*3/uL (ref 0.0–0.1)
Basophils Relative: 0 %
Eosinophils Absolute: 0 10*3/uL (ref 0.0–0.5)
Eosinophils Relative: 0 %
HCT: 38.6 % — ABNORMAL LOW (ref 39.0–52.0)
Hemoglobin: 13 g/dL (ref 13.0–17.0)
Immature Granulocytes: 0 %
Lymphocytes Relative: 28 %
Lymphs Abs: 3.1 10*3/uL (ref 0.7–4.0)
MCH: 31.9 pg (ref 26.0–34.0)
MCHC: 33.7 g/dL (ref 30.0–36.0)
MCV: 94.8 fL (ref 80.0–100.0)
Monocytes Absolute: 0.6 10*3/uL (ref 0.1–1.0)
Monocytes Relative: 6 %
Neutro Abs: 7.5 10*3/uL (ref 1.7–7.7)
Neutrophils Relative %: 66 %
Platelets: 140 10*3/uL — ABNORMAL LOW (ref 150–400)
RBC: 4.07 MIL/uL — ABNORMAL LOW (ref 4.22–5.81)
RDW: 12.3 % (ref 11.5–15.5)
WBC: 11.3 10*3/uL — ABNORMAL HIGH (ref 4.0–10.5)
nRBC: 0 % (ref 0.0–0.2)

## 2021-12-13 LAB — COMPREHENSIVE METABOLIC PANEL
ALT: 42 U/L (ref 0–44)
AST: 55 U/L — ABNORMAL HIGH (ref 15–41)
Albumin: 3.6 g/dL (ref 3.5–5.0)
Alkaline Phosphatase: 83 U/L (ref 38–126)
Anion gap: 10 (ref 5–15)
BUN: 36 mg/dL — ABNORMAL HIGH (ref 8–23)
CO2: 26 mmol/L (ref 22–32)
Calcium: 8.8 mg/dL — ABNORMAL LOW (ref 8.9–10.3)
Chloride: 105 mmol/L (ref 98–111)
Creatinine, Ser: 1.65 mg/dL — ABNORMAL HIGH (ref 0.61–1.24)
GFR, Estimated: 38 mL/min — ABNORMAL LOW (ref >60)
Glucose, Bld: 127 mg/dL — ABNORMAL HIGH (ref 70–99)
Potassium: 4.9 mmol/L (ref 3.5–5.1)
Sodium: 141 mmol/L (ref 135–145)
Total Bilirubin: 1.7 mg/dL — ABNORMAL HIGH (ref 0.3–1.2)
Total Protein: 6.8 g/dL (ref 6.5–8.1)

## 2021-12-13 LAB — MAGNESIUM: Magnesium: 2.3 mg/dL (ref 1.7–2.4)

## 2021-12-13 MED ORDER — SENNA 8.6 MG PO TABS: 1.0000 | ORAL_TABLET | Freq: Every day | ORAL | Status: DC | PRN

## 2021-12-13 MED ORDER — DOCUSATE SODIUM 100 MG PO CAPS
100.0000 mg | ORAL_CAPSULE | Freq: Two times a day (BID) | ORAL | Status: DC
  Administered 2021-12-13 – 2021-12-15 (×4): 100 mg via ORAL
  Filled 2021-12-13 (×6): qty 1

## 2021-12-13 MED ORDER — SODIUM CHLORIDE 0.9 % IV SOLN
2.0000 g | INTRAVENOUS | Status: DC
  Administered 2021-12-14 – 2021-12-16 (×3): 2 g via INTRAVENOUS
  Filled 2021-12-13 (×3): qty 2

## 2021-12-13 NOTE — Progress Notes (Signed)
[facility] [place] NAME: Travis Schultz    MR#:  [id number]  DATE OF BIRTH:  10-Apr-1926  SUBJECTIVE:   came in with weakness and fall at home. Found to have renal failure and UTI.\Patient sitting in the chair during my evaluation doing well daughter at bedside no fever worked with physical therapy   VITALS:  Blood pressure (!) 152/78, pulse (!) 102, temperature (!) 97.5 F (36.4 C), temperature source Oral, resp. rate 20, height 6' (1.829 m), weight 83.5 kg, SpO2 95 %.  PHYSICAL EXAMINATION:   GENERAL:  88 y.o.-year-old patient lying in the bed with no acute distress.  LUNGS: Normal breath sounds bilaterally, no wheezing, rales, rhonchi.  CARDIOVASCULAR: S1, S2 normal. No murmurs, rubs, or gallops.  ABDOMEN: Soft, nontender, nondistended. Bowel sounds present.  EXTREMITIES: No  edema b/l.    NEUROLOGIC: nonfocal  patient is alert and awake SKIN: No obvious rash, lesion, or ulcer.   LABORATORY PANEL:  CBC Recent Labs  Lab 12/13/21 0525  WBC 11.3*  HGB 13.0  HCT 38.6*  PLT 140*    Chemistries  Recent Labs  Lab 12/13/21 0525  NA 141  K 4.9  CL 105  CO2 26  GLUCOSE 127*  BUN 36*  CREATININE 1.65*  CALCIUM 8.8*  MG 2.3  AST 55*  ALT 42  ALKPHOS 83  BILITOT 1.7*   Cardiac Enzymes No results for input(s): TROPONINI in the last 168 hours. RADIOLOGY:  CT HEAD WO CONTRAST (5MM)  Result Date: 12/12/2021 CLINICAL DATA:  Head and neck trauma. Fall. Altered mental status. Dizziness. EXAM: CT HEAD WITHOUT CONTRAST CT CERVICAL SPINE WITHOUT CONTRAST TECHNIQUE: Multidetector CT imaging of the head and cervical spine was performed following the standard protocol without intravenous contrast. Multiplanar CT image reconstructions of the cervical spine were also generated. RADIATION DOSE REDUCTION: This exam was performed according to the departmental dose-optimization program which includes automated exposure control, adjustment  of the mA and/or kV according to patient size and/or use of iterative reconstruction technique. COMPARISON:  Head CT 05/13/2010.  Neck CTA 02/17/2010. FINDINGS: CT HEAD FINDINGS Brain: There is no evidence of an acute infarct, intracranial hemorrhage, mass, midline shift, or extra-axial fluid collection. Generalized cerebral atrophy is mild for age. Hypodensities in the cerebral white matter bilaterally are nonspecific but compatible with mild chronic small vessel ischemic disease. Vascular: Calcified atherosclerosis at the skull base. Skull: No fracture or suspicious osseous lesion. Sinuses/Orbits: Mild mucosal thickening in the right ethmoid and right maxillary sinuses. No significant mastoid fluid. Bilateral cataract extraction. Other: None. CT CERVICAL SPINE FINDINGS Alignment: Straightening of the normal cervical lordosis. Trace anterolisthesis of C6 on C7 and C7 on T1, likely degenerative and facet mediated. Skull base and vertebrae: No acute fracture or suspicious osseous lesion. Soft tissues and spinal canal: No prevertebral fluid or swelling. No visible canal hematoma. Disc levels: Advanced disc degeneration including severe disc space narrowing and prominent degenerative endplate changes at N3-6, C4-5, and C5-6 with at least moderate spinal stenosis at these levels. Advanced cervical facet arthrosis, overall left worse than right. Severe multilevel neural foraminal stenosis. Upper chest: Clear lung apices. Other: Mild-to-moderate calcific atherosclerosis at the left carotid bifurcation. IMPRESSION: 1. No evidence of acute intracranial abnormality. 2. No acute cervical spine fracture. 3. Advanced cervical disc and facet degeneration with multilevel spinal and neural foraminal stenosis. Electronically Signed   By: Logan Bores M.D.   On: 12/12/2021 18:46   CT Cervical Spine Wo  Contrast  Result Date: 12/12/2021 CLINICAL DATA:  Head and neck trauma. Fall. Altered mental status. Dizziness. EXAM: CT HEAD  WITHOUT CONTRAST CT CERVICAL SPINE WITHOUT CONTRAST TECHNIQUE: Multidetector CT imaging of the head and cervical spine was performed following the standard protocol without intravenous contrast. Multiplanar CT image reconstructions of the cervical spine were also generated. RADIATION DOSE REDUCTION: This exam was performed according to the departmental dose-optimization program which includes automated exposure control, adjustment of the mA and/or kV according to patient size and/or use of iterative reconstruction technique. COMPARISON:  Head CT 05/13/2010.  Neck CTA 02/17/2010. FINDINGS: CT HEAD FINDINGS Brain: There is no evidence of an acute infarct, intracranial hemorrhage, mass, midline shift, or extra-axial fluid collection. Generalized cerebral atrophy is mild for age. Hypodensities in the cerebral white matter bilaterally are nonspecific but compatible with mild chronic small vessel ischemic disease. Vascular: Calcified atherosclerosis at the skull base. Skull: No fracture or suspicious osseous lesion. Sinuses/Orbits: Mild mucosal thickening in the right ethmoid and right maxillary sinuses. No significant mastoid fluid. Bilateral cataract extraction. Other: None. CT CERVICAL SPINE FINDINGS Alignment: Straightening of the normal cervical lordosis. Trace anterolisthesis of C6 on C7 and C7 on T1, likely degenerative and facet mediated. Skull base and vertebrae: No acute fracture or suspicious osseous lesion. Soft tissues and spinal canal: No prevertebral fluid or swelling. No visible canal hematoma. Disc levels: Advanced disc degeneration including severe disc space narrowing and prominent degenerative endplate changes at Z6-1, C4-5, and C5-6 with at least moderate spinal stenosis at these levels. Advanced cervical facet arthrosis, overall left worse than right. Severe multilevel neural foraminal stenosis. Upper chest: Clear lung apices. Other: Mild-to-moderate calcific atherosclerosis at the left carotid  bifurcation. IMPRESSION: 1. No evidence of acute intracranial abnormality. 2. No acute cervical spine fracture. 3. Advanced cervical disc and facet degeneration with multilevel spinal and neural foraminal stenosis. Electronically Signed   By: Logan Bores M.D.   On: 12/12/2021 18:46   CT ABDOMEN PELVIS W CONTRAST  Result Date: 12/12/2021 CLINICAL DATA:  Fall in bathroom this morning. Dark urine noted. Dizziness and altered mental status. EXAM: CT ABDOMEN AND PELVIS WITH CONTRAST TECHNIQUE: Multidetector CT imaging of the abdomen and pelvis was performed using the standard protocol following bolus administration of intravenous contrast. RADIATION DOSE REDUCTION: This exam was performed according to the departmental dose-optimization program which includes automated exposure control, adjustment of the mA and/or kV according to patient size and/or use of iterative reconstruction technique. CONTRAST:  158m OMNIPAQUE IOHEXOL 300 MG/ML  SOLN COMPARISON:  01/04/2021 FINDINGS: Lower chest:  Unremarkable. Hepatobiliary: No hepatic laceration or mass identified. Prior cholecystectomy. No evidence of biliary obstruction. Pancreas: Diffuse fatty replacement of pancreas again noted. No evidence of pancreatic mass or pancreatitis. Spleen: No evidence of splenic laceration. Adrenal/Urinary Tract: No hemorrhage or parenchymal lacerations identified. No evidence of mass or hydronephrosis. Right renal sinus cyst again demonstrated. Mildly enlarged urinary bladder again shows mild diffuse wall thickening, which may be due to neurogenic bladder or chronic cystitis. Stomach/Bowel: Unopacified bowel loops are unremarkable in appearance. No evidence of hemoperitoneum. Diverticulosis is seen mainly involving the sigmoid colon, however there is no evidence of diverticulitis. Vascular/Lymphatic: No evidence of abdominal aortic injury. Aortic atherosclerotic calcification noted. No pathologically enlarged lymph nodes identified.  Reproductive: Prior radical prostatectomy again noted. No evidence of recurrent mass in the surgical bed. Other:  None. Musculoskeletal: No acute fractures or suspicious bone lesions identified. IMPRESSION: No evidence of traumatic injury or other acute findings. Stable mildly enlarged  urinary bladder with mild diffuse wall thickening, which may be due to neurogenic bladder or chronic cystitis. Prior prostatectomy.  No evidence of metastatic disease. Colonic diverticulosis, without radiographic evidence of diverticulitis. Aortic Atherosclerosis (ICD10-I70.0). Electronically Signed   By: Marlaine Hind M.D.   On: 12/12/2021 18:48   DG Chest Portable 1 View  Result Date: 12/12/2021 CLINICAL DATA:  Shortness of breath, altered mental status, dark urine, dizziness, found on floor this morning, patient states he was going to the bathroom and fell EXAM: PORTABLE CHEST 1 VIEW COMPARISON:  Portable exam 1720 hours compared to 01/04/2021 FINDINGS: Normal heart size post CABG. Mediastinal contours and pulmonary vascularity normal. Lungs clear. No infiltrate, pleural effusion, or pneumothorax. Osseous structures unremarkable. IMPRESSION: No acute abnormalities. Electronically Signed   By: Lavonia Dana M.D.   On: 12/12/2021 17:28    Assessment and Plan 88 year old male with a history of hypertension,Coronary disease, chronic pain syndrome who presents the ER today after a fall at home.  Patient was unable to get up.  He started having symptoms last night.  Patient's fianc Fraser Din states that patient got up several times last night to go use the bathroom.  This is very unusual for him.  When he was trying to get out of bed on these occasions, he fell and was unable to get up.  Patient's had diarrhea for last couple days.  He wears adult diapers.   Acute cystitis without hematuria --cont   IV rocephin.  --Urine culture pending  --blood cx negative so far -- afebrile. -- Change antibiotic according to urine culture  results  AKI (acute kidney injury) ([place]) -- received continue with gentle IVF. Hold nephrotoxic agents. -- Baseline creatinine 1.0 in 01/2021 -- came in with creatinine of 1.6 -- repeat labs tomorrow   Fall at home, initial encounter --Weakness likely due to UTI and AKI.  -- PT recommends home health  CAD (coronary artery disease) Stable.   Chronic pain syndrome Stable. Scheduled tylenol.   DNR (do not resuscitate) Verified DNR/DNI status with daughter at bedside.   Overall improving. If continues to show improvement can discharge on 14th March with home health. Patient and daughter in agreement   Procedures: Family communication : daughter at bedside Consults : none CODE STATUS: DNR DVT Prophylaxis : enoxaparin Level of care: Med-Surg Status is: Inpatient Remains inpatient appropriate because: AK I/UTI    TOTAL TIME TAKING CARE OF THIS PATIENT: 25 minutes.  >50% time spent on counselling and coordination of care  Note: This dictation was prepared with Dragon dictation along with smaller phrase technology. Any transcriptional errors that result from this process are unintentional.  Fritzi Mandes M.D    [facility]   CC: Primary care physician; [name], MD

## 2021-12-13 NOTE — Progress Notes (Signed)
PHARMACY - PHYSICIAN COMMUNICATION ?CRITICAL VALUE ALERT - BLOOD CULTURE IDENTIFICATION (BCID) ? ?Travis Schultz is an 88 y.o. male who presented to [facility] on 12/12/2021 with a chief complaint of weakness and fall at home ? ?Assessment:  1/4(aerobic) GNR, E.coli, enterobacterales  ? ?Name of physician (or Provider) Contacted: Neomia Glass ? ?Current antibiotics: Rocephin ? ?Changes to prescribed antibiotics recommended:  ?Recommendations accepted by provider. Will increase Rocephin to 2g IV Q24 hours. Likely urinary source. Awaiting urine culture results. ? ?Results for orders placed or performed during the hospital encounter of 12/12/21  ?Blood Culture ID Panel (Reflexed) (Collected: 12/12/2021  4:58 PM)  ?Result Value Ref Range  ? Enterococcus faecalis NOT DETECTED NOT DETECTED  ? Enterococcus Faecium NOT DETECTED NOT DETECTED  ? Listeria monocytogenes NOT DETECTED NOT DETECTED  ? Staphylococcus species NOT DETECTED NOT DETECTED  ? Staphylococcus aureus (BCID) NOT DETECTED NOT DETECTED  ? Staphylococcus epidermidis NOT DETECTED NOT DETECTED  ? Staphylococcus lugdunensis NOT DETECTED NOT DETECTED  ? Streptococcus species NOT DETECTED NOT DETECTED  ? Streptococcus agalactiae NOT DETECTED NOT DETECTED  ? Streptococcus pneumoniae NOT DETECTED NOT DETECTED  ? Streptococcus pyogenes NOT DETECTED NOT DETECTED  ? A.calcoaceticus-baumannii NOT DETECTED NOT DETECTED  ? Bacteroides fragilis NOT DETECTED NOT DETECTED  ? Enterobacterales DETECTED (A) NOT DETECTED  ? Enterobacter cloacae complex NOT DETECTED NOT DETECTED  ? Escherichia coli DETECTED (A) NOT DETECTED  ? Klebsiella aerogenes NOT DETECTED NOT DETECTED  ? Klebsiella oxytoca NOT DETECTED NOT DETECTED  ? Klebsiella pneumoniae NOT DETECTED NOT DETECTED  ? Proteus species NOT DETECTED NOT DETECTED  ? Salmonella species NOT DETECTED NOT DETECTED  ? Serratia marcescens NOT DETECTED NOT DETECTED  ? Haemophilus influenzae NOT DETECTED NOT DETECTED  ? Neisseria  meningitidis NOT DETECTED NOT DETECTED  ? Pseudomonas aeruginosa NOT DETECTED NOT DETECTED  ? Stenotrophomonas maltophilia NOT DETECTED NOT DETECTED  ? Candida albicans NOT DETECTED NOT DETECTED  ? Candida auris NOT DETECTED NOT DETECTED  ? Candida glabrata NOT DETECTED NOT DETECTED  ? Candida krusei NOT DETECTED NOT DETECTED  ? Candida parapsilosis NOT DETECTED NOT DETECTED  ? Candida tropicalis NOT DETECTED NOT DETECTED  ? Cryptococcus neoformans/gattii NOT DETECTED NOT DETECTED  ? CTX-M ESBL NOT DETECTED NOT DETECTED  ? Carbapenem resistance IMP NOT DETECTED NOT DETECTED  ? Carbapenem resistance KPC NOT DETECTED NOT DETECTED  ? Carbapenem resistance NDM NOT DETECTED NOT DETECTED  ? Carbapenem resist OXA 48 LIKE NOT DETECTED NOT DETECTED  ? Carbapenem resistance VIM NOT DETECTED NOT DETECTED  ? ? ?Lyra Alaimo A Gwin Eagon ?12/13/2021  7:11 PM ? ?

## 2021-12-13 NOTE — Progress Notes (Signed)
OT Cancellation Note ? ?Patient Details ?Name: Travis Schultz ?MRN: [id number] ?DOB: 06-02-1926 ? ? ?Cancelled Treatment:    Reason Eval/Treat Not Completed: Other (comment). Consult received, chart reviewed. Pt with nursing students for care upon attempt. Will re-attempt OT evaluation at later date/time as pt is available.  ? ?Ardeth Perfect., MPH, MS, OTR/L ?ascom [phone] ?12/13/21, 3:35 PM ? ?

## 2021-12-13 NOTE — Evaluation (Signed)
Physical Therapy Evaluation Patient Details Name: Travis Schultz MRN: [id number] DOB: 1926/06/01 Today's Date: 12/13/2021  History of Present Illness  Pt is a 88 y.o. male presenting to hospital 3/12 with generalized weakness and fall; c/o L buttock pain.  Per H&P "Patient tells me that he was at a birthday party for his fiance and afterward started feeling generally weak.  Got out of bed to use the bathroom and he fell to the ground.  He does not recall if he syncopized or lost consciousness.  He tried several times to get off the ground but was ultimately unable to.  He stayed the night on the ground.  Did not hit his head.  He endorses some mild lower abdominal pain and is chronically constipated".  Pt admitted with acute cystitis without hematuria, AKI, and fall.  PMH includes CAD, chronic constipation, lymphoma, actinic keratosis, MI (5 bypass's), and chronic pain syndrome; gets ESI back injections; h/o back surgery and R RCR.  Clinical Impression  Prior to hospital admission, pt was modified independent ambulating with SPC;  is active/works out for about 1 hour after getting up in the morning; h/o a couple falls d/t knees giving out; lives alone in 1 level home with 3 STE (no railing).  Currently pt is SBA semi-supine to sitting edge of bed; min assist to stand from bed (pt's knees buckling initially requiring min assist) but progressed to CGA with transfers from recliner and toilet (all with RW use); and CGA to ambulate 25 feet x2 (to/from bathroom) with RW use.  Pt requesting to toilet (for bowel movement) after getting to recliner and able to ambulate to/from bathroom with assist (pt incontinent small soft stool just prior to reaching toilet and formed stool noted in toilet post toileting; pt reporting improved abdominal comfort post toileting)--nurse notified.  Pt would benefit from skilled PT to address noted impairments and functional limitations (see below for any additional details).  Upon  hospital discharge, pt would benefit from [place] and initial 24/7 assist (pt's fiancee reports she is able to provide recommended assist).    Recommendations for follow up therapy are one component of a multi-disciplinary discharge planning process, led by the attending physician.  Recommendations may be updated based on patient status, additional functional criteria and insurance authorization.  Follow Up Recommendations Home health PT    Assistance Recommended at Discharge Frequent or constant Supervision/Assistance  Patient can return home with the following  A little help with walking and/or transfers;A little help with bathing/dressing/bathroom;Assistance with cooking/housework;Assist for transportation;Help with stairs or ramp for entrance    Equipment Recommendations Rolling walker (2 wheels);BSC/3in1  Recommendations for Other Services  OT consult    Functional Status Assessment Patient has had a recent decline in their functional status and demonstrates the ability to make significant improvements in function in a reasonable and predictable amount of time.     Precautions / Restrictions Precautions Precautions: Fall Restrictions Weight Bearing Restrictions: No      Mobility  Bed Mobility Overal bed mobility: Needs Assistance Bed Mobility: Supine to Sit     Supine to sit: Supervision, HOB elevated     General bed mobility comments: Increased effort/time for pt to perform on own    Transfers Overall transfer level: Needs assistance Equipment used: Rolling walker (2 wheels) Transfers: Sit to/from Stand, Bed to chair/wheelchair/BSC Sit to Stand: Min assist, Min guard   Step pivot transfers: Min guard (stand step turn bed to recliner with RW use)  General transfer comment: min assist to stand from bed x1 trial (pt's knees buckling initially requiring min assist for balance but improved to CGA); CGA to stand from recliner x1 trial and from toilet x1 trial; vc's  for UE placement and walker use    Ambulation/Gait Ambulation/Gait assistance: Min guard Gait Distance (Feet):  (25 feet x2 (to bathroom and back)) Assistive device: Rolling walker (2 wheels)   Gait velocity: decreased     General Gait Details: partial step through gait pattern; initially mildly shaky but improved with continued mobility  Stairs            Wheelchair Mobility    Modified Rankin (Stroke Patients Only)       Balance Overall balance assessment: Needs assistance Sitting-balance support: No upper extremity supported, Feet supported Sitting balance-Leahy Scale: Normal Sitting balance - Comments: steady sitting reaching outside BOS   Standing balance support: Single extremity supported Standing balance-Leahy Scale: Fair Standing balance comment: steady standing with at least single UE support                             Pertinent Vitals/Pain Pain Assessment Pain Assessment: Faces Faces Pain Scale: Hurts little more Pain Location: abdomen Pain Descriptors / Indicators: Aching, Tender Pain Intervention(s): Limited activity within patient's tolerance, Monitored during session, Repositioned (pain felt better after bowel movement) Vitals (HR and O2 on room air) stable and WFL throughout treatment session.    Home Living Family/patient expects to be discharged to:: Private residence Living Arrangements: Alone Available Help at Discharge: Friend(s) Celesta Gentile) Type of Home: House Home Access: Stairs to enter Entrance Stairs-Rails: None Entrance Stairs-Number of Steps: 3   Home Layout: One level Home Equipment: Cane - single point;Grab bars - tub/shower      Prior Function Prior Level of Function : Independent/Modified Independent             Mobility Comments: H/o a couple falls (knees give out) last 6 months       Hand Dominance        Extremity/Trunk Assessment   Upper Extremity Assessment Upper Extremity Assessment:  Generalized weakness    Lower Extremity Assessment Lower Extremity Assessment: Generalized weakness    Cervical / Trunk Assessment Cervical / Trunk Assessment: Normal  Communication   Communication: HOH (Hearing aides not in hospital)  Cognition Arousal/Alertness: Awake/alert Behavior During Therapy: WFL for tasks assessed/performed Overall Cognitive Status: Within Functional Limits for tasks assessed                                 General Comments: A&O x4        General Comments  Nursing cleared pt for participation in physical therapy.  Pt agreeable to PT session.  Pt's fiancee present entire session.  Assist required for clean-up d/t bowel incontinence walking to toilet.    Exercises  Transfers and ambulation training   Assessment/Plan    PT Assessment Patient needs continued PT services  PT Problem List Decreased strength;Decreased activity tolerance;Decreased balance;Decreased mobility;Decreased knowledge of use of DME;Decreased knowledge of precautions;Pain       PT Treatment Interventions DME instruction;Gait training;Stair training;Functional mobility training;Therapeutic activities;Therapeutic exercise;Balance training;Patient/family education    PT Goals (Current goals can be found in the Care Plan section)  Acute Rehab PT Goals Patient Stated Goal: to improve strength and mobility PT Goal Formulation: With patient Time For Goal Achievement:  12/27/21 Potential to Achieve Goals: Good    Frequency Min 2X/week     Co-evaluation               AM-PAC PT "6 Clicks" Mobility  Outcome Measure Help needed turning from your back to your side while in a flat bed without using bedrails?: None Help needed moving from lying on your back to sitting on the side of a flat bed without using bedrails?: A Little Help needed moving to and from a bed to a chair (including a wheelchair)?: A Little Help needed standing up from a chair using your arms (e.g.,  wheelchair or bedside chair)?: A Little Help needed to walk in hospital room?: A Little Help needed climbing 3-5 steps with a railing? : A Little 6 Click Score: 19    End of Session Equipment Utilized During Treatment: Gait belt Activity Tolerance: Patient tolerated treatment well Patient left: in chair;with call bell/phone within reach;with chair alarm set;with family/visitor present;Other (comment) (B heels floating via pillow support) Nurse Communication: Mobility status;Precautions;Other (comment) (pt's bowel movement) PT Visit Diagnosis: Unsteadiness on feet (R26.81);Muscle weakness (generalized) (M62.81);History of falling (Z91.81);Other abnormalities of gait and mobility (R26.89)    Time: 3299-2426 PT Time Calculation (min) (ACUTE ONLY): 52 min   Charges:   PT Evaluation $PT Eval Low Complexity: 1 Low PT Treatments $Gait Training: 8-22 mins $Therapeutic Activity: 8-22 mins       Leitha Bleak, PT 12/13/21, 12:20 PM

## 2021-12-13 NOTE — TOC Initial Note (Signed)
Transition of Care (TOC) - Initial/Assessment Note  ? ? ?Patient Details  ?Name: Travis Schultz ?MRN: [id number] ?Date of Birth: Apr 08, 1926 ? ?Transition of Care (TOC) CM/SW Contact:    ?Pete Pelt, RN ?Phone Number: ?12/13/2021, 4:05 PM ? ?Clinical Narrative:     Patient lives alone but friend is able to help at any time he needs.  Friend transports patient to appointments, where he is currently up to date.  Friend will transport pateint home on discharge. ? ?No concerns about obtaining and taking medications, as friend also assists patient with these needs. ? ?Patient will accept PT recommendations for rolling walker and 3 n 1, ordered with [facility]. ? ?[facility] notified of [place] PT OT needs. ? ?TOC contact information provided.  TOC to follow.            ? ? ?Expected Discharge Plan: [place] ?Barriers to Discharge: Continued Medical Work up ? ? ?Patient Goals and CMS Choice ?  ?  ?Choice offered to / list presented to : NA ? ?Expected Discharge Plan and Services ?Expected Discharge Plan: [place] ?  ?Discharge Planning Services: CM Consult ?  ?Living arrangements for the past 2 months: [place] ?                ?DME Arranged: 3-N-1, Walker rolling ?  ?Date DME Agency Contacted: 12/13/21 ?  ?Representative spoke with at DME Agency: Andee Poles ?HH Arranged: PT, OT ?[place] Agency: [facility] ?Date HH Agency Contacted: 12/13/21 ?Time [place] II: [id number] ?Representative spoke with at [place]: Georgina Snell ? ?Prior Living Arrangements/Services ?Living arrangements for the past 2 months: [place] ?Lives with:: Self ?Patient language and need for interpreter reviewed:: Yes (No interpreter required) ?Do you feel safe going back to the place where you live?: Yes      ?Need for Family Participation in Patient Care: Yes (Comment) ?Care giver support system in place?: Yes (comment) ?  ?Criminal Activity/Legal Involvement Pertinent to Current  Situation/Hospitalization: No - Comment as needed ? ?Activities of Daily Living ?Home Assistive Devices/Equipment: Cane (specify quad or straight), Eyeglasses, Hearing aid ?ADL Screening (condition at time of admission) ?Patient's cognitive ability adequate to safely complete daily activities?: Yes ?Is the patient deaf or have difficulty hearing?: Yes ?Does the patient have difficulty seeing, even when wearing glasses/contacts?: No ?Does the patient have difficulty concentrating, remembering, or making decisions?: No ?Patient able to express need for assistance with ADLs?: Yes ?Does the patient have difficulty dressing or bathing?: Yes ?Independently performs ADLs?: No ?Communication: Independent ?Dressing (OT): Needs assistance ?Grooming: Needs assistance ?Feeding: Independent ?Bathing: Needs assistance ?Toileting: Needs assistance ?In/Out Bed: Needs assistance ?Walks in Home: Needs assistance ?Does the patient have difficulty walking or climbing stairs?: Yes ?Weakness of Legs: Both ?Weakness of Arms/Hands: None ? ?Permission Sought/Granted ?Permission sought to share information with : Case Manager ?Permission granted to share information with : Yes, Verbal Permission Granted ?   ? Permission granted to share info w AGENCY: Home health and DME companies ?   ?   ? ?Emotional Assessment ?Appearance:: Appears stated age ?Attitude/Demeanor/Rapport: Gracious, Engaged ?Affect (typically observed): Pleasant, Appropriate ?Orientation: : Oriented to Self, Oriented to Place, Oriented to  Time, Oriented to Situation ?Alcohol / Substance Use: Not Applicable ?Psych Involvement: No (comment) ? ?Admission diagnosis:  Acute cystitis without hematuria [N30.00] ?AKI (acute kidney injury) ([place]) [N17.9] ?Urinary tract infection without hematuria, site unspecified [N39.0] ?Acute cystitis [N30.00] ?  Patient Active Problem List  ? Diagnosis Date Noted  ? Acute cystitis 12/13/2021  ? Acute cystitis without hematuria 12/12/2021  ? AKI  (acute kidney injury) ([place]) 12/12/2021  ? Fall at home, initial encounter 12/12/2021  ? DNR (do not resuscitate) 12/12/2021  ? Belching   ? Abdominal pain 01/04/2021  ? Lumbar facet arthropathy 04/16/2018  ? 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 Lumbar degenerative disc disease 04/16/2018  ? Bilateral primary osteoarthritis of knee 04/16/2018  ? Chronic pain syndrome 04/16/2018  ? Lymphoma ([place]) 1991  ? CAD (coronary artery disease) 1991  ? ?PCP:  [name], MD ?Pharmacy:   ?[facility] - [place], [place] - [address] ?[address] ?[place] [address] ?Phone: [phone] Fax: [phone] ? ? ? ? ?Social Determinants of Health (SDOH) Interventions ?  ? ?Readmission Risk Interventions ?Readmission Risk Prevention Plan 12/13/2021  ?Post Dischage Appt Complete  ?Medication Screening Complete  ?Transportation Screening Complete  ?Some recent data might be hidden  ? ? ? ?

## 2021-12-14 DIAGNOSIS — R7881 Bacteremia: Secondary | ICD-10-CM

## 2021-12-14 DIAGNOSIS — N3000 Acute cystitis without hematuria: Secondary | ICD-10-CM | POA: Diagnosis not present

## 2021-12-14 LAB — BASIC METABOLIC PANEL
Anion gap: 6 (ref 5–15)
BUN: 27 mg/dL — ABNORMAL HIGH (ref 8–23)
CO2: 24 mmol/L (ref 22–32)
Calcium: 8.1 mg/dL — ABNORMAL LOW (ref 8.9–10.3)
Chloride: 103 mmol/L (ref 98–111)
Creatinine, Ser: 1.26 mg/dL — ABNORMAL HIGH (ref 0.61–1.24)
GFR, Estimated: 53 mL/min — ABNORMAL LOW (ref >60)
Glucose, Bld: 112 mg/dL — ABNORMAL HIGH (ref 70–99)
Potassium: 4.1 mmol/L (ref 3.5–5.1)
Sodium: 133 mmol/L — ABNORMAL LOW (ref 135–145)

## 2021-12-14 LAB — CBC WITH DIFFERENTIAL/PLATELET
Abs Immature Granulocytes: 0.02 10*3/uL (ref 0.00–0.07)
Basophils Absolute: 0 10*3/uL (ref 0.0–0.1)
Basophils Relative: 0 %
Eosinophils Absolute: 0 10*3/uL (ref 0.0–0.5)
Eosinophils Relative: 0 %
HCT: 34.8 % — ABNORMAL LOW (ref 39.0–52.0)
Hemoglobin: 11.9 g/dL — ABNORMAL LOW (ref 13.0–17.0)
Immature Granulocytes: 0 %
Lymphocytes Relative: 29 %
Lymphs Abs: 2.2 10*3/uL (ref 0.7–4.0)
MCH: 31.6 pg (ref 26.0–34.0)
MCHC: 34.2 g/dL (ref 30.0–36.0)
MCV: 92.6 fL (ref 80.0–100.0)
Monocytes Absolute: 0.4 10*3/uL (ref 0.1–1.0)
Monocytes Relative: 5 %
Neutro Abs: 4.9 10*3/uL (ref 1.7–7.7)
Neutrophils Relative %: 66 %
Platelets: 125 10*3/uL — ABNORMAL LOW (ref 150–400)
RBC: 3.76 MIL/uL — ABNORMAL LOW (ref 4.22–5.81)
RDW: 12 % (ref 11.5–15.5)
WBC: 7.5 10*3/uL (ref 4.0–10.5)
nRBC: 0 % (ref 0.0–0.2)

## 2021-12-14 LAB — MAGNESIUM: Magnesium: 2 mg/dL (ref 1.7–2.4)

## 2021-12-14 MED ORDER — SODIUM CHLORIDE 0.9 % IV SOLN: INTRAVENOUS | Status: DC

## 2021-12-14 MED ORDER — SENNOSIDES-DOCUSATE SODIUM 8.6-50 MG PO TABS
2.0000 | ORAL_TABLET | Freq: Two times a day (BID) | ORAL | Status: DC
  Administered 2021-12-14 – 2021-12-15 (×2): 2 via ORAL
  Filled 2021-12-14 (×4): qty 2

## 2021-12-14 MED ORDER — LACTULOSE 10 GM/15ML PO SOLN
20.0000 g | Freq: Every day | ORAL | Status: DC
  Administered 2021-12-14 – 2021-12-15 (×2): 20 g via ORAL
  Filled 2021-12-14 (×2): qty 30

## 2021-12-14 MED ORDER — POLYETHYLENE GLYCOL 3350 17 G PO PACK
17.0000 g | PACK | Freq: Every day | ORAL | Status: DC
  Administered 2021-12-14 – 2021-12-15 (×2): 17 g via ORAL
  Filled 2021-12-14 (×2): qty 1

## 2021-12-14 NOTE — Hospital Course (Signed)
Mr. Brensinger is a 88 year old male with PMH HTN, CAD, chronic pain who presented after a fall at home.  He had also developed worsening weakness in general which contributed to his fall.  Due to this, he presented for further evaluation.  ?Work-up was notable for acute cystitis and complication of bacteremia.  Urine and blood cultures grew E. coli.  He was initially started on Rocephin on admission. ?

## 2021-12-14 NOTE — Progress Notes (Signed)
Physical Therapy Treatment ?Patient Details ?Name: CHALMER ZHENG ?MRN: [id number] ?DOB: September 03, 1926 ?Today's Date: 12/14/2021 ? ? ?History of Present Illness Pt is a 88 y.o. male presenting to hospital 3/12 with generalized weakness and fall; c/o L buttock pain.  Per H&P "Patient tells me that he was at a birthday party for his fianc?e and afterward started feeling generally weak.  Got out of bed to use the bathroom and he fell to the ground.  He does not recall if he syncopized or lost consciousness.  He tried several times to get off the ground but was ultimately unable to.  He stayed the night on the ground.  Did not hit his head.  He endorses some mild lower abdominal pain and is chronically constipated".  Pt admitted with acute cystitis without hematuria, AKI, and fall.  PMH includes CAD, chronic constipation, lymphoma, actinic keratosis, MI (5 bypass's), and chronic pain syndrome; gets ESI back injections; h/o back surgery and R RCR. ? ?  ?PT Comments  ? ? Pt resting in recliner upon PT arrival; initially declining PT but then agreeable with a little encouragement; pt's fiancee present entire session.  CGA with transfers from recliner x3 trials and from toilet x1 trial (use of RW) and CGA with ambulation 200 feet with RW use (chair follow provided).  Pt incontinent of urine requiring assist for clean-up beginning of session and brief donned for mobility; pt utilized toilet end of session and able to have bowel movement.  Will continue to focus on strengthening, balance, progressive functional mobility, and trial stairs as appropriate during hospitalization. ?   ?Recommendations for follow up therapy are one component of a multi-disciplinary discharge planning process, led by the attending physician.  Recommendations may be updated based on patient status, additional functional criteria and insurance authorization. ? ?Follow Up Recommendations ? Home health PT (pending pt progress) ?  ?  ?Assistance Recommended  at Discharge Frequent or constant Supervision/Assistance  ?Patient can return home with the following A little help with walking and/or transfers;A little help with bathing/dressing/bathroom;Assistance with cooking/housework;Assist for transportation;Help with stairs or ramp for entrance ?  ?Equipment Recommendations ? Rolling walker (2 wheels);BSC/3in1  ?  ?Recommendations for Other Services OT consult ? ? ?  ?Precautions / Restrictions Precautions ?Precautions: Fall ?Restrictions ?Weight Bearing Restrictions: No  ?  ? ?Mobility ? Bed Mobility ?  ?  ?  ?  ?  ?  ?  ?General bed mobility comments: Deferred (pt in recliner beginning/end of session) ?  ? ?Transfers ?Overall transfer level: Needs assistance ?Equipment used: Rolling walker (2 wheels) ?Transfers: Sit to/from Stand ?Sit to Stand: Min guard ?  ?  ?  ?  ?  ?General transfer comment: x3 trials standing from recliner; x1 trial from toilet; vc's and tactile cues (d/t HOH) for UE placement; steady with RW use ?  ? ?Ambulation/Gait ?Ambulation/Gait assistance: Min guard ?Gait Distance (Feet): 200 Feet ?Assistive device: Rolling walker (2 wheels) ?  ?Gait velocity: decreased ?  ?  ?General Gait Details: partial step through gait pattern; intermittent vc's for upright posture (which improved pt's positioning within walker) ? ? ?Stairs ?  ?  ?  ?  ?  ? ? ?Wheelchair Mobility ?  ? ?Modified Rankin (Stroke Patients Only) ?  ? ? ?  ?Balance Overall balance assessment: Needs assistance ?Sitting-balance support: No upper extremity supported, Feet supported ?Sitting balance-Leahy Scale: Good ?Sitting balance - Comments: steady sitting reaching within BOS ?  ?Standing balance support: Single extremity supported ?  ?  Standing balance comment: steady standing with at least single UE support ?  ?  ?  ?  ?  ?  ?  ?  ?  ?  ?  ?  ? ?  ?Cognition Arousal/Alertness: Awake/alert ?Behavior During Therapy: [facility] for tasks assessed/performed ?Overall Cognitive Status: Within Functional  Limits for tasks assessed ?  ?  ?  ?  ?  ?  ?  ?  ?  ?  ?  ?  ?  ?  ?  ?  ?General Comments: Mild increased time to respond to cues at times ?  ?  ? ?  ?Exercises   ? ?  ?General Comments  Pt agreeable to PT session. ? ?  ?  ? ?Pertinent Vitals/Pain Pain Assessment ?Pain Assessment: Faces ?Faces Pain Scale: Hurts little more ?Pain Location: abdominal discomfort ?Pain Descriptors / Indicators: Discomfort ?Pain Intervention(s): Limited activity within patient's tolerance, Monitored during session, Repositioned (toileted during session) ?Vitals (HR and O2 on room air) stable and WFL throughout treatment session.  ? ? ?Home Living   ?  ?  ?  ?  ?  ?  ?  ?  ?  ?   ?  ?Prior Function    ?  ?  ?   ? ?PT Goals (current goals can now be found in the care plan section) Acute Rehab PT Goals ?Patient Stated Goal: to improve strength and mobility ?PT Goal Formulation: With patient ?Time For Goal Achievement: 12/27/21 ?Potential to Achieve Goals: Good ? ?  ?Frequency ? ? ? Min 2X/week ? ? ? ?  ?PT Plan    ? ? ?Co-evaluation   ?  ?  ?  ?  ? ?  ?AM-PAC PT "6 Clicks" Mobility   ?Outcome Measure ? Help needed turning from your back to your side while in a flat bed without using bedrails?: None ?Help needed moving from lying on your back to sitting on the side of a flat bed without using bedrails?: A Little ?Help needed moving to and from a bed to a chair (including a wheelchair)?: A Little ?Help needed standing up from a chair using your arms (e.g., wheelchair or bedside chair)?: A Little ?Help needed to walk in hospital room?: A Little ?Help needed climbing 3-5 steps with a railing? : A Little ?6 Click Score: 19 ? ?  ?End of Session Equipment Utilized During Treatment: Gait belt ?Activity Tolerance: Patient tolerated treatment well ?Patient left: in chair;with call bell/phone within reach;with chair alarm set;with family/visitor present;with nursing/sitter in room ?Nurse Communication: Mobility status;Precautions ?PT Visit  Diagnosis: Unsteadiness on feet (R26.81);Muscle weakness (generalized) (M62.81);History of falling (Z91.81);Other abnormalities of gait and mobility (R26.89) ?  ? ? ?Time: 9292-4462 ?PT Time Calculation (min) (ACUTE ONLY): 53 min ? ?Charges:  $Gait Training: 23-37 mins ?$Therapeutic Activity: 23-37 mins          ?          ?Leitha Bleak, PT ?12/14/21, 3:45 PM ? ? ?

## 2021-12-14 NOTE — Progress Notes (Addendum)
?Progress Note ? ? ?Patient: Travis Schultz QIH:[id number] DOB: Jan 28, 1926 DOA: 12/12/2021     1 ?DOS: the patient was seen and examined on 12/14/2021 ?  ?Brief hospital course: ?Mr. Poffenberger is a 88 year old male with PMH HTN, CAD, chronic pain who presented after a fall at home.  He had also developed worsening weakness in general which contributed to his fall.  Due to this, he presented for further evaluation.  ?Work-up was notable for acute cystitis and complication of bacteremia.  Urine and blood cultures grew E. coli.  He was initially started on Rocephin on admission. ? ?Assessment and Plan: ?* Acute cystitis without hematuria ?- Continue Rocephin ?- Urine culture growing E. coli, sensitivities pending ? ?Bacteremia due to Escherichia coli ?- Presumed translocation from urinary source ?- Continue Rocephin ?- Follow-up sensitivities ?- Tentative plan for total of 7-day course ? ?AKI (acute kidney injury) ([place]) ?- baseline creatinine ~ 1 ?- patient presents with increase in creat >0.3 mg/dL above baseline, creat increase >1.5x baseline presumed to have occurred within past 7 days PTA ?-Creatinine 1.6 on admission.  Presumed hypovolemic/prerenal ?-Creatinine responding to fluids ?- Continue IV fluids ? ? ?Fall at home, initial encounter ?- Weakness presumed due to UTI and bacteremia as well as AKI ?- Appreciate PT/OT evaluations ?- Home health ordered at discharge ? ?CAD (coronary artery disease) ?Stable. ? ?Chronic pain syndrome ?Stable. Scheduled tylenol. ? ?DNR (do not resuscitate) ?Verified DNR/DNI status with patient at bedside. Witnessed by his fiance Pat. ? ? ? ?  ? ?Subjective: Family bedside this morning.  Update given to daughter with questions answered.  Patient feeling somewhat better in general compared to admission but still complaining of feeling weak and lethargic. ?Due to ongoing concern for safety at home, will continue 1 more night in hospital to allow further conditioning and working with  therapy.  ? ?Physical Exam: ?Vitals:  ? 12/13/21 2114 12/14/21 0042 12/14/21 0510 12/14/21 0817  ?BP: (!) 159/82  (!) 159/75 (!) 160/77  ?Pulse: 95  91 90  ?Resp: '18  19 18  '$ ?Temp: 100.1 ?F (37.8 ?C) 99.7 ?F (37.6 ?C) 99.2 ?F (37.3 ?C) 99.6 ?F (37.6 ?C)  ?TempSrc: Oral Oral Oral Oral  ?SpO2: 95%  93% 92%  ?Weight:   84.4 kg   ?Height:      ? ?Physical Exam ?Constitutional:   ?   General: He is not in acute distress. ?   Comments: Lethargic appearing  ?HENT:  ?   Head: Normocephalic and atraumatic.  ?   Comments: Mild abrasion noted on forehead ?   Mouth/Throat:  ?   Mouth: Mucous membranes are moist.  ?Eyes:  ?   Extraocular Movements: Extraocular movements intact.  ?Cardiovascular:  ?   Rate and Rhythm: Normal rate and regular rhythm.  ?   Heart sounds: Normal heart sounds.  ?Pulmonary:  ?   Effort: Pulmonary effort is normal. No respiratory distress.  ?   Breath sounds: Normal breath sounds. No wheezing.  ?Abdominal:  ?   General: Bowel sounds are normal. There is no distension.  ?   Palpations: Abdomen is soft.  ?   Tenderness: There is no abdominal tenderness.  ?Musculoskeletal:     ?   General: Normal range of motion.  ?   Cervical back: Normal range of motion and neck supple.  ?Skin: ?   General: Skin is warm and dry.  ?Neurological:  ?   General: No focal deficit present.  ?   Mental Status:  He is alert.  ?Psychiatric:     ?   Mood and Affect: Mood normal.     ?   Behavior: Behavior normal.  ? ? ?Data Reviewed: ?Results for orders placed or performed during the hospital encounter of 12/12/21 (from the past 24 hour(s))  ?Basic metabolic panel     Status: Abnormal  ? Collection Time: 12/14/21  8:00 AM  ?Result Value Ref Range  ? Sodium 133 (L) 135 - 145 mmol/L  ? Potassium 4.1 3.5 - 5.1 mmol/L  ? Chloride 103 98 - 111 mmol/L  ? CO2 24 22 - 32 mmol/L  ? Glucose, Bld 112 (H) 70 - 99 mg/dL  ? BUN 27 (H) 8 - 23 mg/dL  ? Creatinine, Ser 1.26 (H) 0.61 - 1.24 mg/dL  ? Calcium 8.1 (L) 8.9 - 10.3 mg/dL  ? GFR, Estimated  53 (L) >60 mL/min  ? Anion gap 6 5 - 15  ?CBC with Differential/Platelet     Status: Abnormal  ? Collection Time: 12/14/21  8:00 AM  ?Result Value Ref Range  ? WBC 7.5 4.0 - 10.5 K/uL  ? RBC 3.76 (L) 4.22 - 5.81 MIL/uL  ? Hemoglobin 11.9 (L) 13.0 - 17.0 g/dL  ? HCT 34.8 (L) 39.0 - 52.0 %  ? MCV 92.6 80.0 - 100.0 fL  ? MCH 31.6 26.0 - 34.0 pg  ? MCHC 34.2 30.0 - 36.0 g/dL  ? RDW 12.0 11.5 - 15.5 %  ? Platelets 125 (L) 150 - 400 K/uL  ? nRBC 0.0 0.0 - 0.2 %  ? Neutrophils Relative % 66 %  ? Neutro Abs 4.9 1.7 - 7.7 K/uL  ? Lymphocytes Relative 29 %  ? Lymphs Abs 2.2 0.7 - 4.0 K/uL  ? Monocytes Relative 5 %  ? Monocytes Absolute 0.4 0.1 - 1.0 K/uL  ? Eosinophils Relative 0 %  ? Eosinophils Absolute 0.0 0.0 - 0.5 K/uL  ? Basophils Relative 0 %  ? Basophils Absolute 0.0 0.0 - 0.1 K/uL  ? Immature Granulocytes 0 %  ? Abs Immature Granulocytes 0.02 0.00 - 0.07 K/uL  ?Magnesium     Status: None  ? Collection Time: 12/14/21  8:00 AM  ?Result Value Ref Range  ? Magnesium 2.0 1.7 - 2.4 mg/dL  ?  ?I have Reviewed nursing notes, Vitals, and Lab results since pt's last encounter. Pertinent lab results : see above ?I have ordered test including BMP, CBC, Mg ?I have reviewed the last note from staff over past 24 hours ?I have discussed pt's care plan and test results with nursing staff, case manager ? ? ?Family Communication: daughter ? ?Disposition: ?Status is: Inpatient ?Remains inpatient appropriate because: Treatment as outlined in A&P ? ? Planned Discharge Destination: Home with Home Health ? ? ? ?Antimicrobials: ?Rocephin 12/12/2021 >> current ? ?Consultants: ? ? ?Procedures:  ? ? ?DVT ppx:  ?enoxaparin (LOVENOX) injection 40 mg Start: 12/12/21 2200 ?SCDs Start: 12/12/21 2038 ? ? ?  Code Status: DNR  ? ? ?Author: ?Dwyane Dee, MD ?12/14/2021 2:22 PM ? ?For on call review [email or web address].  ?

## 2021-12-14 NOTE — Plan of Care (Signed)

## 2021-12-14 NOTE — Evaluation (Signed)
Occupational Therapy Evaluation Patient Details Name: Travis Schultz MRN: [id number] DOB: [birth year] Today's Date: 12/14/2021   History of Present Illness Pt is a 88 y.o. male presenting to hospital 3/12 with generalized weakness and fall; c/o L buttock pain.  Per H&P "Patient tells me that he was at a birthday party for his fiance and afterward started feeling generally weak.  Got out of bed to use the bathroom and he fell to the ground.  He does not recall if he syncopized or lost consciousness.  He tried several times to get off the ground but was ultimately unable to.  He stayed the night on the ground.  Did not hit his head.  He endorses some mild lower abdominal pain and is chronically constipated".  Pt admitted with acute cystitis without hematuria, AKI, and fall.  PMH includes CAD, chronic constipation, lymphoma, actinic keratosis, MI (5 bypass's), and chronic pain syndrome; gets ESI back injections; h/o back surgery and R RCR.   Clinical Impression   Pt was seen for OT evaluation this date. Prior to hospital admission, pt was independent with mobility, ADL, driving, and enjoyed using an exercise bike and lifting weights daily. Pt lives alone and fiance able to assist as needed. Currently pt demonstrates impairments as described below (See OT problem list) which functionally limit his ability to perform ADL/self-care tasks. Pt currently requires MIN A for bed mobility, MIN A for ADL transfers, VC for RW mgt, and MIN A for LB ADL tasks 2/2 decreased strength, activity tolerance, balance, and low back pain that worsens with bending. Pt instructed in figure four technique for LB ADL and instruction in ADL transfers using RW. Pt/family instructed in falls prevention, role of OT, HH vs SNF for rehab. Pt would benefit from skilled OT services to address noted impairments and functional limitations (see below for any additional details) in order to maximize safety and independence while minimizing  falls risk and caregiver burden. Upon hospital discharge, recommend STR to maximize pt safety and return to PLOF.     Recommendations for follow up therapy are one component of a multi-disciplinary discharge planning process, led by the attending physician.  Recommendations may be updated based on patient status, additional functional criteria and insurance authorization.   Follow Up Recommendations  Skilled nursing-short term rehab (<3 hours/day)    Assistance Recommended at Discharge Intermittent Supervision/Assistance  Patient can return home with the following A little help with walking and/or transfers;A little help with bathing/dressing/bathroom;Assistance with cooking/housework;Assist for transportation;Help with stairs or ramp for entrance;Direct supervision/assist for medications management    Functional Status Assessment  Patient has had a recent decline in their functional status and demonstrates the ability to make significant improvements in function in a reasonable and predictable amount of time.  Equipment Recommendations  BSC/3in1    Recommendations for Other Services       Precautions / Restrictions Precautions Precautions: Fall Restrictions Weight Bearing Restrictions: No      Mobility Bed Mobility Overal bed mobility: Needs Assistance Bed Mobility: Supine to Sit     Supine to sit: Min assist, HOB elevated     General bed mobility comments: MIN A for trunk elevation    Transfers Overall transfer level: Independent Equipment used: Rolling walker (2 wheels) Transfers: Sit to/from Stand, Bed to chair/wheelchair/BSC Sit to Stand: Min assist     Step pivot transfers: Min guard     General transfer comment: VC for RW mgt      Balance Overall balance assessment:  Needs assistance Sitting-balance support: No upper extremity supported, Feet supported, Single extremity supported Sitting balance-Leahy Scale: Fair     Standing balance support: Bilateral  upper extremity supported, Reliant on assistive device for balance Standing balance-Leahy Scale: Fair                             ADL either performed or assessed with clinical judgement   ADL Overall ADL's : Needs assistance/impaired                                       General ADL Comments: Pt currently requires MIN A for LB ADL tasks, pain with bending to feet, pt instucted in modified technique using figure-4 for LB dressing to minimize pain wiht pt able to return demo, CGA-MIN A For ADL transfers with RW     Vision         Perception     Praxis      Pertinent Vitals/Pain Pain Assessment Pain Assessment: Faces Faces Pain Scale: Hurts little more Pain Location: low back w bending Pain Descriptors / Indicators: Aching, Grimacing Pain Intervention(s): Limited activity within patient's tolerance, Monitored during session, Repositioned     Hand Dominance Right   Extremity/Trunk Assessment Upper Extremity Assessment Upper Extremity Assessment: Generalized weakness   Lower Extremity Assessment Lower Extremity Assessment: Generalized weakness   Cervical / Trunk Assessment Cervical / Trunk Assessment: Normal   Communication Communication Communication: HOH (Hearing aides not in hospital)   Cognition Arousal/Alertness: Awake/alert Behavior During Therapy: WFL for tasks assessed/performed Overall Cognitive Status: Within Functional Limits for tasks assessed                                 General Comments: grossly WFL, however, at times requires slightly increased processing time for tasks and to follow more complex commands     General Comments       Exercises Other Exercises Other Exercises: Pt instructed in modified techniques for LB dressing and bathing from seated position to minimize low back pain; pt able to return demo with noted improvement in back pain Other Exercises: pt/family educated in role of OT   Shoulder  Instructions      Home Living Family/patient expects to be discharged to:: Private residence Living Arrangements: Alone Available Help at Discharge: Friend(s);Family;Available PRN/intermittently (fiance, daughter (lives 2 hr away)) Type of Home: House Home Access: Stairs to enter Secretary/administrator of Steps: 3 Entrance Stairs-Rails: None Home Layout: One level     Bathroom Shower/Tub: Producer, television/film/video: Handicapped height     Home Equipment: Cane - single point;Grab bars - tub/shower          Prior Functioning/Environment Prior Level of Function : Independent/Modified Independent             Mobility Comments: H/o a couple falls (knees give out) last 6 months ADLs Comments: independent at baseline, works out daily on exercise bike and lifts light weights        OT Problem List: Decreased strength;Decreased activity tolerance;Impaired balance (sitting and/or standing);Decreased knowledge of use of DME or AE      OT Treatment/Interventions: Self-care/ADL training;Therapeutic exercise;Therapeutic activities;Energy conservation;DME and/or AE instruction;Patient/family education;Balance training    OT Goals(Current goals can be found in the care plan section) Acute Rehab  OT Goals Patient Stated Goal: get stronger to go home OT Goal Formulation: With patient/family Time For Goal Achievement: 12/28/21 Potential to Achieve Goals: Good ADL Goals Pt Will Perform Lower Body Dressing: sit to/from stand;with modified independence Pt Will Transfer to Toilet: with supervision;ambulating (elevated commode, LRAD PRN) Additional ADL Goal #1: Pt will perform bed mobility with modified independence in preparation for EOB/OOB ADL Additional ADL Goal #2: Pt will complete UB/LB bathing primarily from seated position with remote supervision for safety.  OT Frequency: Min 3X/week    Co-evaluation              AM-PAC OT "6 Clicks" Daily Activity     Outcome  Measure Help from another person eating meals?: None Help from another person taking care of personal grooming?: A Little Help from another person toileting, which includes using toliet, bedpan, or urinal?: A Little Help from another person bathing (including washing, rinsing, drying)?: A Little Help from another person to put on and taking off regular upper body clothing?: A Little Help from another person to put on and taking off regular lower body clothing?: A Little 6 Click Score: 19   End of Session Equipment Utilized During Treatment: Rolling walker (2 wheels);Gait belt  Activity Tolerance: Patient tolerated treatment well Patient left: in chair;with call bell/phone within reach;with chair alarm set;with family/visitor present  OT Visit Diagnosis: Other abnormalities of gait and mobility (R26.89);Muscle weakness (generalized) (M62.81);History of falling (Z91.81)                Time: 5621-3086 OT Time Calculation (min): 28 min Charges:  OT General Charges $OT Visit: 1 Visit OT Evaluation $OT Eval Moderate Complexity: 1 Mod OT Treatments $Self Care/Home Management : 8-22 mins  Arman Filter., MPH, MS, OTR/L ascom [phone] 12/14/21, 10:51 AM

## 2021-12-14 NOTE — Progress Notes (Signed)
Physical Therapy Treatment ?Patient Details ?Name: Travis Schultz ?MRN: [id number] ?DOB: October 16, 1925 ?Today's Date: 12/14/2021 ? ? ?History of Present Illness Pt is a 88 y.o. male presenting to hospital 3/12 with generalized weakness and fall; c/o L buttock pain.  Per H&P "Patient tells me that he was at a birthday party for his fianc?e and afterward started feeling generally weak.  Got out of bed to use the bathroom and he fell to the ground.  He does not recall if he syncopized or lost consciousness.  He tried several times to get off the ground but was ultimately unable to.  He stayed the night on the ground.  Did not hit his head.  He endorses some mild lower abdominal pain and is chronically constipated".  Pt admitted with acute cystitis without hematuria, AKI, and fall.  PMH includes CAD, chronic constipation, lymphoma, actinic keratosis, MI (5 bypass's), and chronic pain syndrome; gets ESI back injections; h/o back surgery and R RCR. ? ?  ?PT Comments  ? ? Received message from pt's nurse regarding pt requesting to ambulate again this afternoon (nurse requesting assist d/t requiring 2nd assist for chair follow this morning).  Pt sitting in recliner upon PT arrival with pt's nurse present (pt ready to ambulate).  SBA with transfers using RW and CGA with ambulation 240 feet with RW use (chair follow provided but pt able to walk around entire nursing station and back to room without requiring standing or sitting rest break; pt also appearing stronger in general this afternoon and able to walk a little faster this afternoon).  Pt did require intermittent vc's for upright posture during ambulation.  Pt's daughter verbalizing concerns regarding pt discharging home at this time.  D/t pt's current progress and status with functional mobility, continue to recommend HHPT.  Will monitor pt's status and progress with functional mobility (and trial stairs as appropriate) next session.  Pt would also benefit from mobility  specialist during hospitalization (mobility specialist notified). ?  ?Recommendations for follow up therapy are one component of a multi-disciplinary discharge planning process, led by the attending physician.  Recommendations may be updated based on patient status, additional functional criteria and insurance authorization. ? ?Follow Up Recommendations ? Home health PT (pending pt progress) ?  ?  ?Assistance Recommended at Discharge Intermittent Supervision/Assistance  ?Patient can return home with the following A little help with walking and/or transfers;A little help with bathing/dressing/bathroom;Assistance with cooking/housework;Assist for transportation;Help with stairs or ramp for entrance ?  ?Equipment Recommendations ? Rolling walker (2 wheels);BSC/3in1  ?  ?Recommendations for Other Services OT consult ? ? ?  ?Precautions / Restrictions Precautions ?Precautions: Fall ?Restrictions ?Weight Bearing Restrictions: No  ?  ? ?Mobility ? Bed Mobility ?Overal bed mobility: Needs Assistance ?Bed Mobility: Sit to Supine ?  ?  ?  ?Sit to supine: Supervision ?  ?  ? ?Transfers ?Overall transfer level: Needs assistance ?Equipment used: Rolling walker (2 wheels) ?Transfers: Sit to/from Stand ?Sit to Stand: Supervision ?  ?  ?  ?  ?  ?General transfer comment: x1 trial standing from recliner; vc's for UE placement ?  ? ?Ambulation/Gait ?Ambulation/Gait assistance: Min guard ?Gait Distance (Feet): 240 Feet ?Assistive device: Rolling walker (2 wheels) ?  ?Gait velocity: mildly increased compared to morning ambulation trial ?  ?  ?General Gait Details: partial step through gait pattern; intermittent vc's for upright posture (which improved pt's positioning within walker) ? ? ?Stairs ?  ?  ?  ?  ?  ? ? ?  Wheelchair Mobility ?  ? ?Modified Rankin (Stroke Patients Only) ?  ? ? ?  ?Balance Overall balance assessment: Needs assistance ?Sitting-balance support: No upper extremity supported, Feet supported ?Sitting balance-Leahy  Scale: Normal ?Sitting balance - Comments: steady sitting reaching outside BOS ?  ?Standing balance support: No upper extremity supported ?Standing balance-Leahy Scale: Good ?Standing balance comment: steady standing putting on mask ?  ?  ?  ?  ?  ?  ?  ?  ?  ?  ?  ?  ? ?  ?Cognition Arousal/Alertness: Awake/alert ?Behavior During Therapy: [facility] for tasks assessed/performed ?Overall Cognitive Status: Within Functional Limits for tasks assessed ?  ?  ?  ?  ?  ?  ?  ?  ?  ?  ?  ?  ?  ?  ?  ?  ?General Comments: Mild increased time to respond to cues at times ?  ?  ? ?  ?Exercises   ? ?  ?General Comments  Nursing cleared pt for participation in physical therapy.  Pt agreeable to PT session.   ?  ?  ? ?Pertinent Vitals/Pain Pain Assessment ?Pain Assessment: Faces ?Faces Pain Scale: Hurts a little bit ?Pain Location: abdominal discomfort ?Pain Descriptors / Indicators: Discomfort ?Pain Intervention(s): Limited activity within patient's tolerance, Monitored during session, Repositioned ?Vitals (HR and O2 on room air) stable and WFL throughout treatment session.  ? ? ?Home Living   ?  ?  ?  ?  ?  ?  ?  ?  ?  ?   ?  ?Prior Function    ?  ?  ?   ? ?PT Goals (current goals can now be found in the care plan section) Acute Rehab PT Goals ?Patient Stated Goal: to improve strength and mobility ?PT Goal Formulation: With patient ?Time For Goal Achievement: 12/27/21 ?Potential to Achieve Goals: Good ?Progress towards PT goals: Progressing toward goals ? ?  ?Frequency ? ? ? Min 2X/week ? ? ? ?  ?PT Plan Current plan remains appropriate  ? ? ?Co-evaluation   ?  ?  ?  ?  ? ?  ?AM-PAC PT "6 Clicks" Mobility   ?Outcome Measure ? Help needed turning from your back to your side while in a flat bed without using bedrails?: None ?Help needed moving from lying on your back to sitting on the side of a flat bed without using bedrails?: A Little ?Help needed moving to and from a bed to a chair (including a wheelchair)?: A Little ?Help needed  standing up from a chair using your arms (e.g., wheelchair or bedside chair)?: A Little ?Help needed to walk in hospital room?: A Little ?Help needed climbing 3-5 steps with a railing? : A Little ?6 Click Score: 19 ? ?  ?End of Session Equipment Utilized During Treatment: Gait belt ?Activity Tolerance: Patient tolerated treatment well ?Patient left: in bed (nurse present assisting pt with set-up in bed end of session) ?Nurse Communication: Mobility status;Precautions ?PT Visit Diagnosis: Unsteadiness on feet (R26.81);Muscle weakness (generalized) (M62.81);History of falling (Z91.81);Other abnormalities of gait and mobility (R26.89) ?  ? ? ?Time: 1610-9604 ?PT Time Calculation (min) (ACUTE ONLY): 12 min ? ?Charges:  $Gait Training: 8-22 mins ?$Therapeutic Activity: 23-37 mins          ?          ?Leitha Bleak, PT ?12/14/21, 4:03 PM ? ? ?

## 2021-12-14 NOTE — Assessment & Plan Note (Addendum)
-   Presumed translocation from urinary source ?-Treated with Rocephin during hospitalization and de-escalated to [facility] at discharge to complete total of 7-day course ?

## 2021-12-14 NOTE — Progress Notes (Signed)
Mobility Specialist - Progress Note ? ? 12/14/21 1700  ?Mobility  ?Activity Ambulated with assistance in hallway  ?Level of Assistance Standby assist, set-up cues, supervision of patient - no hands on  ?Assistive Device Front wheel walker  ?Distance Ambulated (ft) 200 ft  ?Activity Response Tolerated well  ?$Mobility charge 1 Mobility  ? ? ? ?Pre-mobility: 83 HR, 97% SpO2 ?During mobility: 108 HR, 95% SpO2 ? ? ?Pt ambulated in hallway with supervision. VC for corrective posture. Does voice generalized weakness, mostly in LLE---noted knee buckle x1 during ambulation. Pt ambulated to bathroom for hopeful BM once returned to room, but unsuccessful---some urinal out however. Pt returned to EOB with supper arrival. Family members at bedside. RN notified.  ? ? ?Travis Schultz ?Mobility Specialist ?12/14/21, 5:12 PM ? ? ? ?

## 2021-12-15 DIAGNOSIS — N3000 Acute cystitis without hematuria: Secondary | ICD-10-CM | POA: Diagnosis not present

## 2021-12-15 DIAGNOSIS — W19XXXA Unspecified fall, initial encounter: Secondary | ICD-10-CM | POA: Diagnosis not present

## 2021-12-15 DIAGNOSIS — R7881 Bacteremia: Secondary | ICD-10-CM | POA: Diagnosis not present

## 2021-12-15 DIAGNOSIS — R5381 Other malaise: Secondary | ICD-10-CM | POA: Diagnosis not present

## 2021-12-15 LAB — CBC WITH DIFFERENTIAL/PLATELET
Abs Immature Granulocytes: 0.01 10*3/uL (ref 0.00–0.07)
Basophils Absolute: 0 10*3/uL (ref 0.0–0.1)
Basophils Relative: 1 %
Eosinophils Absolute: 0.1 10*3/uL (ref 0.0–0.5)
Eosinophils Relative: 2 %
HCT: 33.2 % — ABNORMAL LOW (ref 39.0–52.0)
Hemoglobin: 11.5 g/dL — ABNORMAL LOW (ref 13.0–17.0)
Immature Granulocytes: 0 %
Lymphocytes Relative: 38 %
Lymphs Abs: 2.1 10*3/uL (ref 0.7–4.0)
MCH: 31.9 pg (ref 26.0–34.0)
MCHC: 34.6 g/dL (ref 30.0–36.0)
MCV: 92.2 fL (ref 80.0–100.0)
Monocytes Absolute: 0.4 10*3/uL (ref 0.1–1.0)
Monocytes Relative: 7 %
Neutro Abs: 3 10*3/uL (ref 1.7–7.7)
Neutrophils Relative %: 52 %
Platelets: 128 10*3/uL — ABNORMAL LOW (ref 150–400)
RBC: 3.6 MIL/uL — ABNORMAL LOW (ref 4.22–5.81)
RDW: 11.9 % (ref 11.5–15.5)
WBC: 5.6 10*3/uL (ref 4.0–10.5)
nRBC: 0 % (ref 0.0–0.2)

## 2021-12-15 LAB — CULTURE, BLOOD (ROUTINE X 2): Special Requests: ADEQUATE

## 2021-12-15 LAB — BASIC METABOLIC PANEL
Anion gap: 4 — ABNORMAL LOW (ref 5–15)
BUN: 22 mg/dL (ref 8–23)
CO2: 28 mmol/L (ref 22–32)
Calcium: 7.9 mg/dL — ABNORMAL LOW (ref 8.9–10.3)
Chloride: 101 mmol/L (ref 98–111)
Creatinine, Ser: 1.12 mg/dL (ref 0.61–1.24)
GFR, Estimated: >60 mL/min (ref >60)
Glucose, Bld: 102 mg/dL — ABNORMAL HIGH (ref 70–99)
Potassium: 3.8 mmol/L (ref 3.5–5.1)
Sodium: 133 mmol/L — ABNORMAL LOW (ref 135–145)

## 2021-12-15 LAB — URINE CULTURE: Culture: >=100000 — AB

## 2021-12-15 LAB — MAGNESIUM: Magnesium: 2.2 mg/dL (ref 1.7–2.4)

## 2021-12-15 NOTE — Plan of Care (Signed)

## 2021-12-15 NOTE — Progress Notes (Signed)
?Progress Note ? ? ?Patient: Travis Schultz JJH:[id number] DOB: 20-Dec-1925 DOA: 12/12/2021     2 ?DOS: the patient was seen and examined on 12/15/2021 ?  ?Brief hospital course: ?Travis Schultz is a 88 year old male with PMH HTN, CAD, chronic pain who presented after a fall at home.  He had also developed worsening weakness in general which contributed to his fall.  Due to this, he presented for further evaluation.  ?Work-up was notable for acute cystitis and complication of bacteremia.  Urine and blood cultures grew E. coli.  He was initially started on Rocephin on admission. ? ?Assessment and Plan: ?* Acute cystitis without hematuria ?- Continue Rocephin ?- Urine culture growing E. coli, sensitivities reviewed.  See bacteremia ? ?Bacteremia due to Escherichia coli ?- Presumed translocation from urinary source ?- Continue Rocephin ?- Sensitivities reviewed.  Will de-escalate to Keflex at discharge ?- Tentative plan for total of 7-day course ? ?AKI (acute kidney injury) ([place]) ?- baseline creatinine ~ 1 ?- patient presents with increase in creat >0.3 mg/dL above baseline, creat increase >1.5x baseline presumed to have occurred within past 7 days PTA ?-Creatinine 1.6 on admission.  Presumed hypovolemic/prerenal ?-Creatinine responding to fluids ?- Continue IV fluids ? ? ?Fall at home, initial encounter ?- Weakness presumed due to UTI and bacteremia as well as AKI ?- Appreciate PT/OT evaluations ?- Home health ordered at discharge ? ?CAD (coronary artery disease) ?Stable. ? ?Chronic pain syndrome ?Stable. Scheduled tylenol. ? ?DNR (do not resuscitate) ?Verified DNR/DNI status with patient at bedside. Witnessed by his fiance Travis Schultz. ? ? ? ?  ? ?Subjective: No events overnight.  Energy actually appears more improved today compared to yesterday.  He has been ambulating well with physical therapy this morning.  We discussed 1 more night in the hospital to continue regaining further strength since going home with home  health. ? ?Physical Exam: ?Vitals:  ? 12/15/21 0443 12/15/21 0500 12/15/21 0713 12/15/21 1511  ?BP: (!) 153/73  (!) 153/75 127/68  ?Pulse: 76  76 82  ?Resp: '19  17 19  '$ ?Temp: 98.3 ?F (36.8 ?C)  98.2 ?F (36.8 ?C) 97.6 ?F (36.4 ?C)  ?TempSrc: Oral   Oral  ?SpO2: 96%  95% 98%  ?Weight:  83 kg    ?Height:      ? ?Physical Exam ?Constitutional:   ?   General: He is not in acute distress. ?   Comments: Lethargy appears improving  ?HENT:  ?   Head: Normocephalic and atraumatic.  ?   Comments: Mild abrasion noted on forehead ?   Mouth/Throat:  ?   Mouth: Mucous membranes are moist.  ?Eyes:  ?   Extraocular Movements: Extraocular movements intact.  ?Cardiovascular:  ?   Rate and Rhythm: Normal rate and regular rhythm.  ?   Heart sounds: Normal heart sounds.  ?Pulmonary:  ?   Effort: Pulmonary effort is normal. No respiratory distress.  ?   Breath sounds: Normal breath sounds. No wheezing.  ?Abdominal:  ?   General: Bowel sounds are normal. There is no distension.  ?   Palpations: Abdomen is soft.  ?   Tenderness: There is no abdominal tenderness.  ?Musculoskeletal:     ?   General: Normal range of motion.  ?   Cervical back: Normal range of motion and neck supple.  ?Skin: ?   General: Skin is warm and dry.  ?Neurological:  ?   General: No focal deficit present.  ?   Mental Status: He is alert.  ?  Psychiatric:     ?   Mood and Affect: Mood normal.     ?   Behavior: Behavior normal.  ? ? ?Data Reviewed: ?Results for orders placed or performed during the hospital encounter of 12/12/21 (from the past 24 hour(s))  ?Basic metabolic panel     Status: Abnormal  ? Collection Time: 12/15/21  5:20 AM  ?Result Value Ref Range  ? Sodium 133 (L) 135 - 145 mmol/L  ? Potassium 3.8 3.5 - 5.1 mmol/L  ? Chloride 101 98 - 111 mmol/L  ? CO2 28 22 - 32 mmol/L  ? Glucose, Bld 102 (H) 70 - 99 mg/dL  ? BUN 22 8 - 23 mg/dL  ? Creatinine, Ser 1.12 0.61 - 1.24 mg/dL  ? Calcium 7.9 (L) 8.9 - 10.3 mg/dL  ? GFR, Estimated >60 >60 mL/min  ? Anion gap 4 (L)  5 - 15  ?CBC with Differential/Platelet     Status: Abnormal  ? Collection Time: 12/15/21  5:20 AM  ?Result Value Ref Range  ? WBC 5.6 4.0 - 10.5 K/uL  ? RBC 3.60 (L) 4.22 - 5.81 MIL/uL  ? Hemoglobin 11.5 (L) 13.0 - 17.0 g/dL  ? HCT 33.2 (L) 39.0 - 52.0 %  ? MCV 92.2 80.0 - 100.0 fL  ? MCH 31.9 26.0 - 34.0 pg  ? MCHC 34.6 30.0 - 36.0 g/dL  ? RDW 11.9 11.5 - 15.5 %  ? Platelets 128 (L) 150 - 400 K/uL  ? nRBC 0.0 0.0 - 0.2 %  ? Neutrophils Relative % 52 %  ? Neutro Abs 3.0 1.7 - 7.7 K/uL  ? Lymphocytes Relative 38 %  ? Lymphs Abs 2.1 0.7 - 4.0 K/uL  ? Monocytes Relative 7 %  ? Monocytes Absolute 0.4 0.1 - 1.0 K/uL  ? Eosinophils Relative 2 %  ? Eosinophils Absolute 0.1 0.0 - 0.5 K/uL  ? Basophils Relative 1 %  ? Basophils Absolute 0.0 0.0 - 0.1 K/uL  ? Immature Granulocytes 0 %  ? Abs Immature Granulocytes 0.01 0.00 - 0.07 K/uL  ?Magnesium     Status: None  ? Collection Time: 12/15/21  5:20 AM  ?Result Value Ref Range  ? Magnesium 2.2 1.7 - 2.4 mg/dL  ?  ?I have Reviewed nursing notes, Vitals, and Lab results since pt's last encounter. Pertinent lab results : see above ?I have ordered test including BMP, CBC, Mg ?I have reviewed the last note from staff over past 24 hours ?I have discussed pt's care plan and test results with nursing staff, case manager ? ? ?Family Communication: daughter ? ?Disposition: ?Status is: Inpatient ?Remains inpatient appropriate because: Treatment as outlined in A&P ? ? Planned Discharge Destination: Home with Home Health ? ? ? ?Antimicrobials: ?Rocephin 12/12/2021 >> current ? ?Consultants: ? ? ?Procedures:  ? ? ?DVT ppx:  ?enoxaparin (LOVENOX) injection 40 mg Start: 12/12/21 2200 ?SCDs Start: 12/12/21 2038 ? ? ?  Code Status: DNR  ? ? ?Author: ?Dwyane Dee, MD ?12/15/2021 3:31 PM ? ?For on call review [email or web address].  ?

## 2021-12-15 NOTE — Progress Notes (Signed)
Occupational Therapy Treatment ?Patient Details ?Name: Travis Schultz ?MRN: [id number] ?DOB: Jul 08, 1926 ?Today's Date: 12/15/2021 ? ? ?History of present illness Pt is a 88 y.o. male presenting to hospital 3/12 with generalized weakness and fall; c/o L buttock pain.  Per H&P "Patient tells me that he was at a birthday party for his fianc?e and afterward started feeling generally weak.  Got out of bed to use the bathroom and he fell to the ground.  He does not recall if he syncopized or lost consciousness.  He tried several times to get off the ground but was ultimately unable to.  He stayed the night on the ground.  Did not hit his head.  He endorses some mild lower abdominal pain and is chronically constipated".  Pt admitted with acute cystitis without hematuria, AKI, and fall.  PMH includes CAD, chronic constipation, lymphoma, actinic keratosis, MI (5 bypass's), and chronic pain syndrome; gets ESI back injections; h/o back surgery and R RCR. ?  ?OT comments ? Mr Franchi was seen for OT treatment on this date. Upon arrival to room pt seated in chair, finacee at bedside. Pt requires MOD I don B socks in sitting. SUPERVISION + RW don briefs sit<>stand at chair. SUPERVISION + RW for toilet t/f at low commode height and hand washing stnading sink side. Ultimately requests assist for pericare for thoroughness - instructed on AE for independence at home (bidet attachment, bottom buddy, or flushable wipes). Instructed on bed rails for falls prevention at homePt making good progress toward goals. Pt continues to benefit from skilled OT services to maximize return to PLOF and minimize risk of future falls, injury, caregiver burden, and readmission. Will continue to follow POC. Discharge recommendation updated to reflect pt progress.  ?  ? ?Recommendations for follow up therapy are one component of a multi-disciplinary discharge planning process, led by the attending physician.  Recommendations may be updated based on  patient status, additional functional criteria and insurance authorization. ?   ?Follow Up Recommendations ? Home health OT  ?  ?Assistance Recommended at Discharge Intermittent Supervision/Assistance  ?Patient can return home with the following ? A little help with bathing/dressing/bathroom;Assistance with cooking/housework ?  ?Equipment Recommendations ? BSC/3in1  ?  ?Recommendations for Other Services   ? ?  ?Precautions / Restrictions Precautions ?Precautions: Fall ?Restrictions ?Weight Bearing Restrictions: No  ? ? ?  ? ?Mobility Bed Mobility ?  ?  ?  ?  ?  ?  ?  ?General bed mobility comments: received and left in chair ?  ? ?Transfers ?Overall transfer level: Needs assistance ?Equipment used: Rolling walker (2 wheels) ?Transfers: Sit to/from Stand ?Sit to Stand: Supervision ?  ?  ?  ?  ?  ?  ?  ?  ?Balance Overall balance assessment: Needs assistance ?Sitting-balance support: No upper extremity supported, Feet supported ?Sitting balance-Leahy Scale: Normal ?  ?  ?Standing balance support: No upper extremity supported, During functional activity ?Standing balance-Leahy Scale: Good ?  ?  ?  ?  ?  ?  ?  ?  ?  ?  ?  ?  ?   ? ?ADL either performed or assessed with clinical judgement  ? ?ADL Overall ADL's : Needs assistance/impaired ?  ?  ?  ?  ?  ?  ?  ?  ?  ?  ?  ?  ?  ?  ?  ?  ?  ?  ?  ?General ADL Comments: MOD I don B socks in sitting.  SUPERVISION + RW don briefs sit<>stand at chair. SUPERVISION + RW for toilet t/f at low commode height and hand washing stnading sink side. Ultimately requests assist for pericare for thoroughness - instructed on AE for independence at home (bidet attachment, bottom buddy, or flushable wipes). Instructed on bed rails for falls prevention at home ?  ? ? ? ?Cognition Arousal/Alertness: Awake/alert ?Behavior During Therapy: [facility] for tasks assessed/performed ?Overall Cognitive Status: Within Functional Limits for tasks assessed ?  ?  ?  ?  ?  ?  ?  ?  ?  ?  ?  ?  ?  ?  ?  ?  ?  ?  ?   ?   ?   ?   ?   ? ? ?Pertinent Vitals/ Pain       Pain Assessment ?Pain Assessment: No/denies pain ? ? ?Frequency ? Min 3X/week  ? ? ? ? ?  ?Progress Toward Goals ? ?OT Goals(current goals can now be found in the care plan section) ? Progress towards OT goals: Progressing toward goals ? ?Acute Rehab OT Goals ?Patient Stated Goal: to go home ?OT Goal Formulation: With patient/family ?Time For Goal Achievement: 12/28/21 ?Potential to Achieve Goals: Good ?ADL Goals ?Pt Will Perform Lower Body Dressing: sit to/from stand;with modified independence ?Pt Will Transfer to Toilet: with supervision;ambulating ?Additional ADL Goal #1: Pt will perform bed mobility with modified independence in preparation for EOB/OOB ADL ?Additional ADL Goal #2: Pt will complete UB/LB bathing primarily from seated position with remote supervision for safety.  ?Plan Discharge plan needs to be updated;Frequency remains appropriate   ? ?Co-evaluation ? ? ?   ?  ?  ?  ?  ? ?  ?AM-PAC OT "6 Clicks" Daily Activity     ?Outcome Measure ? ? Help from another person eating meals?: None ?Help from another person taking care of personal grooming?: None ?Help from another person toileting, which includes using toliet, bedpan, or urinal?: A Little ?Help from another person bathing (including washing, rinsing, drying)?: A Little ?Help from another person to put on and taking off regular upper body clothing?: None ?Help from another person to put on and taking off regular lower body clothing?: A Little ?6 Click Score: 21 ? ?  ?End of Session Equipment Utilized During Treatment: Rolling walker (2 wheels);Gait belt ? ?OT Visit Diagnosis: Other abnormalities of gait and mobility (R26.89);Muscle weakness (generalized) (M62.81);History of falling (Z91.81) ?  ?Activity Tolerance Patient tolerated treatment well ?  ?Patient Left in chair;with call bell/phone within reach;with chair alarm set;with family/visitor present ?  ?Nurse Communication   ?  ? ?   ? ?Time:  6384-6659 ?OT Time Calculation (min): 29 min ? ?Charges: OT General Charges ?$OT Visit: 1 Visit ?OT Treatments ?$Self Care/Home Management : 23-37 mins ? ?Dessie Coma, M.S. OTR/L  ?12/15/21, 4:07 PM  ?ascom [phone] ? ?

## 2021-12-15 NOTE — Progress Notes (Signed)
Physical Therapy Treatment ?Patient Details ?Name: Travis Schultz ?MRN: [id number] ?DOB: 1926-02-10 ?Today's Date: 12/15/2021 ? ? ?History of Present Illness Pt is a 88 y.o. male presenting to hospital 3/12 with generalized weakness and fall; c/o L buttock pain.  Per H&P "Patient tells me that he was at a birthday party for his fianc?e and afterward started feeling generally weak.  Got out of bed to use the bathroom and he fell to the ground.  He does not recall if he syncopized or lost consciousness.  He tried several times to get off the ground but was ultimately unable to.  He stayed the night on the ground.  Did not hit his head.  He endorses some mild lower abdominal pain and is chronically constipated".  Pt admitted with acute cystitis without hematuria, AKI, and fall.  PMH includes CAD, chronic constipation, lymphoma, actinic keratosis, MI (5 bypass's), and chronic pain syndrome; gets ESI back injections; h/o back surgery and R RCR. ? ?  ?PT Comments  ? ? Pt is making good progress towards goals with ability to ambulate around RN station with ease. No issues with B knee weakness or buckling. Educated to continue to use RW for all mobility. Offered stair training, declined at this time. Answered family questions regarding disposition. ENcouraged pt to continue to be active/mobile. Will continue to progress as able.  ?Recommendations for follow up therapy are one component of a multi-disciplinary discharge planning process, led by the attending physician.  Recommendations may be updated based on patient status, additional functional criteria and insurance authorization. ? ?Follow Up Recommendations ? Home health PT ?  ?  ?Assistance Recommended at Discharge Intermittent Supervision/Assistance  ?Patient can return home with the following A little help with walking and/or transfers;A little help with bathing/dressing/bathroom;Assistance with cooking/housework;Assist for transportation;Help with stairs or ramp for  entrance ?  ?Equipment Recommendations ?  (equipment in room)  ?  ?Recommendations for Other Services   ? ? ?  ?Precautions / Restrictions Precautions ?Precautions: Fall ?Restrictions ?Weight Bearing Restrictions: No  ?  ? ?Mobility ? Bed Mobility ?  ?  ?  ?  ?  ?  ?  ?General bed mobility comments: not performed as he was received in recliner ?  ? ?Transfers ?Overall transfer level: Needs assistance ?Equipment used: Rolling walker (2 wheels) ?Transfers: Sit to/from Stand ?Sit to Stand: Supervision ?  ?  ?  ?  ?  ?General transfer comment: safe technique with cues for pushing from seated surface. Once standing RW used. ?  ? ?Ambulation/Gait ?Ambulation/Gait assistance: Supervision ?Gait Distance (Feet): 200 Feet ?Assistive device: Rolling walker (2 wheels) ?Gait Pattern/deviations: Step-through pattern ?  ?  ?  ?General Gait Details: ambulated with upright posture and tends to keep RW too far away from body. Able to self correct occasionally. Good speed and safe judgement. 1 cue for obstacle avoidance ? ? ?Stairs ?Stairs:  (offerred to perform stair training with polite denial) ?  ?  ?  ?  ? ? ?Wheelchair Mobility ?  ? ?Modified Rankin (Stroke Patients Only) ?  ? ? ?  ?Balance Overall balance assessment: Needs assistance ?Sitting-balance support: No upper extremity supported, Feet supported ?Sitting balance-Leahy Scale: Normal ?  ?  ?Standing balance support: No upper extremity supported ?Standing balance-Leahy Scale: Good ?  ?  ?  ?  ?  ?  ?  ?  ?  ?  ?  ?  ?  ? ?  ?Cognition Arousal/Alertness: Awake/alert ?Behavior During Therapy: [facility]  for tasks assessed/performed ?Overall Cognitive Status: Within Functional Limits for tasks assessed ?  ?  ?  ?  ?  ?  ?  ?  ?  ?  ?  ?  ?  ?  ?  ?  ?  ?  ?  ? ?  ?Exercises Other Exercises ?Other Exercises: pt/family educated in recommendations for discharge and deferred medical questions to MD that entered room at end of session. ? ?  ?General Comments   ?  ?  ? ?Pertinent  Vitals/Pain Pain Assessment ?Pain Assessment: Faces ?Faces Pain Scale: Hurts little more ?Pain Location: abdominal discomfort ?Pain Descriptors / Indicators: Discomfort ?Pain Intervention(s): Limited activity within patient's tolerance, Repositioned  ? ? ?Home Living   ?  ?  ?  ?  ?  ?  ?  ?  ?  ?   ?  ?Prior Function    ?  ?  ?   ? ?PT Goals (current goals can now be found in the care plan section) Acute Rehab PT Goals ?Patient Stated Goal: to improve strength and mobility ?PT Goal Formulation: With patient ?Time For Goal Achievement: 12/27/21 ?Potential to Achieve Goals: Good ?Progress towards PT goals: Progressing toward goals ? ?  ?Frequency ? ? ? Min 2X/week ? ? ? ?  ?PT Plan Current plan remains appropriate  ? ? ?Co-evaluation   ?  ?  ?  ?  ? ?  ?AM-PAC PT "6 Clicks" Mobility   ?Outcome Measure ? Help needed turning from your back to your side while in a flat bed without using bedrails?: None ?Help needed moving from lying on your back to sitting on the side of a flat bed without using bedrails?: None ?Help needed moving to and from a bed to a chair (including a wheelchair)?: None ?Help needed standing up from a chair using your arms (e.g., wheelchair or bedside chair)?: A Little ?Help needed to walk in hospital room?: A Little ?Help needed climbing 3-5 steps with a railing? : A Little ?6 Click Score: 21 ? ?  ?End of Session Equipment Utilized During Treatment: Gait belt ?Activity Tolerance: Patient tolerated treatment well ?Patient left: in chair;with chair alarm set ?Nurse Communication: Mobility status;Precautions ?PT Visit Diagnosis: Unsteadiness on feet (R26.81);Muscle weakness (generalized) (M62.81);History of falling (Z91.81);Other abnormalities of gait and mobility (R26.89) ?  ? ? ?Time: 0981-1914 ?PT Time Calculation (min) (ACUTE ONLY): 13 min ? ?Charges:  $Gait Training: 8-22 mins          ?          ? ?Greggory Stallion, PT, DPT, GCS ?[phone] ? ? ? ?Travis Schultz ?12/15/2021, 1:33 PM ? ?

## 2021-12-16 DIAGNOSIS — N179 Acute kidney failure, unspecified: Secondary | ICD-10-CM | POA: Diagnosis not present

## 2021-12-16 DIAGNOSIS — N3000 Acute cystitis without hematuria: Secondary | ICD-10-CM | POA: Diagnosis not present

## 2021-12-16 DIAGNOSIS — B9620 Unspecified Escherichia coli [E. coli] as the cause of diseases classified elsewhere: Secondary | ICD-10-CM | POA: Diagnosis not present

## 2021-12-16 DIAGNOSIS — R7881 Bacteremia: Secondary | ICD-10-CM | POA: Diagnosis not present

## 2021-12-16 LAB — CBC WITH DIFFERENTIAL/PLATELET
Abs Immature Granulocytes: 0.02 10*3/uL (ref 0.00–0.07)
Basophils Absolute: 0 10*3/uL (ref 0.0–0.1)
Basophils Relative: 1 %
Eosinophils Absolute: 0.1 10*3/uL (ref 0.0–0.5)
Eosinophils Relative: 2 %
HCT: 32.4 % — ABNORMAL LOW (ref 39.0–52.0)
Hemoglobin: 11.3 g/dL — ABNORMAL LOW (ref 13.0–17.0)
Immature Granulocytes: 0 %
Lymphocytes Relative: 45 %
Lymphs Abs: 2.6 10*3/uL (ref 0.7–4.0)
MCH: 31.6 pg (ref 26.0–34.0)
MCHC: 34.9 g/dL (ref 30.0–36.0)
MCV: 90.5 fL (ref 80.0–100.0)
Monocytes Absolute: 0.5 10*3/uL (ref 0.1–1.0)
Monocytes Relative: 9 %
Neutro Abs: 2.5 10*3/uL (ref 1.7–7.7)
Neutrophils Relative %: 43 %
Platelets: 152 10*3/uL (ref 150–400)
RBC: 3.58 MIL/uL — ABNORMAL LOW (ref 4.22–5.81)
RDW: 11.8 % (ref 11.5–15.5)
WBC: 5.8 10*3/uL (ref 4.0–10.5)
nRBC: 0 % (ref 0.0–0.2)

## 2021-12-16 LAB — BASIC METABOLIC PANEL
Anion gap: 7 (ref 5–15)
BUN: 18 mg/dL (ref 8–23)
CO2: 27 mmol/L (ref 22–32)
Calcium: 8.3 mg/dL — ABNORMAL LOW (ref 8.9–10.3)
Chloride: 103 mmol/L (ref 98–111)
Creatinine, Ser: 1.22 mg/dL (ref 0.61–1.24)
GFR, Estimated: 55 mL/min — ABNORMAL LOW (ref >60)
Glucose, Bld: 118 mg/dL — ABNORMAL HIGH (ref 70–99)
Potassium: 3.9 mmol/L (ref 3.5–5.1)
Sodium: 137 mmol/L (ref 135–145)

## 2021-12-16 LAB — MAGNESIUM: Magnesium: 2.1 mg/dL (ref 1.7–2.4)

## 2021-12-16 MED ORDER — CEFDINIR 300 MG PO CAPS: 600.0000 mg | ORAL_CAPSULE | Freq: Every day | ORAL | 0 refills | Status: AC

## 2021-12-16 NOTE — Plan of Care (Signed)
?  Problem: Education: ?Goal: Knowledge of General Education information will improve ?Description: Including pain rating scale, medication(s)/side effects and non-pharmacologic comfort measures ?12/16/2021 1059 by Ralph Benavidez, Camillo Flaming, RN ?Outcome: Adequate for Discharge ?12/16/2021 0848 by Daisy Blossom, RN ?Outcome: Progressing ?  ?Problem: Health Behavior/Discharge Planning: ?Goal: Ability to manage health-related needs will improve ?12/16/2021 1059 by Yovany Clock, Camillo Flaming, RN ?Outcome: Adequate for Discharge ?12/16/2021 0848 by Daisy Blossom, RN ?Outcome: Progressing ?  ?Problem: Clinical Measurements: ?Goal: Ability to maintain clinical measurements within normal limits will improve ?12/16/2021 1059 by Breleigh Carpino, Camillo Flaming, RN ?Outcome: Adequate for Discharge ?12/16/2021 0848 by Daisy Blossom, RN ?Outcome: Progressing ?Goal: Will remain free from infection ?12/16/2021 1059 by Aquila Menzie, Camillo Flaming, RN ?Outcome: Adequate for Discharge ?12/16/2021 0848 by Daisy Blossom, RN ?Outcome: Progressing ?Goal: Diagnostic test results will improve ?12/16/2021 1059 by Solveig Fangman, Camillo Flaming, RN ?Outcome: Adequate for Discharge ?12/16/2021 0848 by Daisy Blossom, RN ?Outcome: Progressing ?Goal: Respiratory complications will improve ?12/16/2021 1059 by Zayon Trulson, Camillo Flaming, RN ?Outcome: Adequate for Discharge ?12/16/2021 0848 by Daisy Blossom, RN ?Outcome: Progressing ?Goal: Cardiovascular complication will be avoided ?12/16/2021 1059 by Izael Bessinger, Camillo Flaming, RN ?Outcome: Adequate for Discharge ?12/16/2021 0848 by Daisy Blossom, RN ?Outcome: Progressing ?  ?Problem: Activity: ?Goal: Risk for activity intolerance will decrease ?12/16/2021 1059 by Shewanda Sharpe, Camillo Flaming, RN ?Outcome: Adequate for Discharge ?12/16/2021 0848 by Daisy Blossom, RN ?Outcome: Progressing ?  ?Problem: Nutrition: ?Goal: Adequate nutrition will be maintained ?12/16/2021 1059 by Oliviarose Punch, Camillo Flaming, RN ?Outcome: Adequate for Discharge ?12/16/2021 0848 by  Daisy Blossom, RN ?Outcome: Progressing ?  ?Problem: Coping: ?Goal: Level of anxiety will decrease ?12/16/2021 1059 by Jahred Tatar, Camillo Flaming, RN ?Outcome: Adequate for Discharge ?12/16/2021 0848 by Daisy Blossom, RN ?Outcome: Progressing ?  ?Problem: Elimination: ?Goal: Will not experience complications related to bowel motility ?12/16/2021 1059 by Samiyah Stupka, Camillo Flaming, RN ?Outcome: Adequate for Discharge ?12/16/2021 0848 by Daisy Blossom, RN ?Outcome: Progressing ?Goal: Will not experience complications related to urinary retention ?12/16/2021 1059 by Lakelyn Straus, Camillo Flaming, RN ?Outcome: Adequate for Discharge ?12/16/2021 0848 by Daisy Blossom, RN ?Outcome: Progressing ?  ?Problem: Pain Managment: ?Goal: General experience of comfort will improve ?12/16/2021 1059 by Neshia Mckenzie, Camillo Flaming, RN ?Outcome: Adequate for Discharge ?12/16/2021 0848 by Daisy Blossom, RN ?Outcome: Progressing ?  ?Problem: Safety: ?Goal: Ability to remain free from injury will improve ?12/16/2021 1059 by Taron Mondor, Camillo Flaming, RN ?Outcome: Adequate for Discharge ?12/16/2021 0848 by Daisy Blossom, RN ?Outcome: Progressing ?  ?Problem: Skin Integrity: ?Goal: Risk for impaired skin integrity will decrease ?12/16/2021 1059 by Nariah Morgano, Camillo Flaming, RN ?Outcome: Adequate for Discharge ?12/16/2021 0848 by Daisy Blossom, RN ?Outcome: Progressing ?  ?

## 2021-12-16 NOTE — Care Management Important Message (Signed)
Important Message ? ?Patient Details  ?Name: Travis Schultz ?MRN: [id number] ?Date of Birth: 02-17-1926 ? ? ?Medicare Important Message Given:  Yes ? ? ? ? ?Juliann Pulse A Jericca Russett ?12/16/2021, 9:36 AM ?

## 2021-12-16 NOTE — Progress Notes (Signed)
Mobility Specialist - Progress Note ? ? 12/16/21 1000  ?Mobility  ?Activity Ambulated with assistance in hallway  ?Level of Assistance Standby assist, set-up cues, supervision of patient - no hands on  ?Assistive Device Front wheel walker  ?Distance Ambulated (ft) 200 ft  ?Activity Response Tolerated well  ?$Mobility charge 1 Mobility  ? ? ? ?Pt sitting in recliner upon arrival, utilizing RA. Pt ambulated in hallway with supervision, no complaints during activity. Fatigue post-session. Pt left in recliner with needs in reach, family at bedside.  ? ? ?Kathee Delton ?Mobility Specialist ?12/16/21, 10:38 AM ? ? ? ?

## 2021-12-16 NOTE — Plan of Care (Signed)

## 2021-12-17 LAB — CULTURE, BLOOD (ROUTINE X 2)
Culture: NO GROWTH
Special Requests: ADEQUATE

## 2021-12-17 NOTE — Discharge Summary (Signed)
?Physician Discharge Summary ?  ?Patient: Travis Schultz MRN: [id number] DOB: [birth year]  ?Admit date:     12/12/2021  ?Discharge date: 12/16/2021  ?Discharge Physician: Dwyane Dee  ? ?PCP: [name], MD  ? ?Recommendations at discharge:  ? ? Continue routine outpatient care ? ?Discharge Diagnoses: ?Principal Problem: ?  Acute cystitis without hematuria ?Active Problems: ?  AKI (acute kidney injury) ([place]) ?  Bacteremia due to Escherichia coli ?  Fall at home, initial encounter ?  Chronic pain syndrome ?  CAD (coronary artery disease) ?  DNR (do not resuscitate) ?  Acute cystitis ? ?Resolved Problems: ?  * No resolved hospital problems. * ? ?Hospital Course: ?Travis Schultz is a 88 year old male with PMH HTN, CAD, chronic pain who presented after a fall at home.  He had also developed worsening weakness in general which contributed to his fall.  Due to this, he presented for further evaluation.  ?Work-up was notable for acute cystitis and complication of bacteremia.  Urine and blood cultures grew E. coli.  He was initially started on Rocephin on admission. ? ?Assessment and Plan: ?* Acute cystitis without hematuria ?- Treated with Rocephin during hospitalization ?- Urine culture growing E. coli, sensitivities reviewed.  See bacteremia ? ?Bacteremia due to Escherichia coli ?- Presumed translocation from urinary source ?-Treated with Rocephin during hospitalization and de-escalated to [facility] at discharge to complete total of 7-day course ? ?AKI (acute kidney injury) ([place]) ?- baseline creatinine ~ 1 ?- patient presents with increase in creat >0.3 mg/dL above baseline, creat increase >1.5x baseline presumed to have occurred within past 7 days PTA ?-Creatinine 1.6 on admission.  Presumed hypovolemic/prerenal ?-Responded well to fluids, creatinine 1.2 at discharge ? ? ?Fall at home, initial encounter ?- Weakness presumed due to UTI and bacteremia as well as AKI ?- Appreciate PT/OT evaluations ?- Home health  ordered at discharge ? ?CAD (coronary artery disease) ?Stable. ? ?Chronic pain syndrome ?Stable. Scheduled tylenol. ? ?DNR (do not resuscitate) ?Verified DNR/DNI status with patient at bedside. Witnessed by his fiance Pat. ? ? ? ? ?  ? ? ?Consultants:  ?Procedures performed:   ?Disposition: Home health ?Diet recommendation:  ?Discharge Diet Orders (From admission, onward)  ? ?  Start     Ordered  ? 12/16/21 0000  Diet general       ? 12/16/21 0943  ? ?  ?  ? ?  ? ?Regular diet ?DISCHARGE MEDICATION: ?Allergies as of 12/16/2021   ? ?   Reactions  ? Phenergan [promethazine Hcl]   ? Altered mental status  ? ?  ? ?  ?Medication List  ?  ? ?TAKE these medications   ? ?acetaminophen 325 MG tablet ?Commonly known as: TYLENOL ?Take 325 mg by mouth at bedtime. ?  ?aspirin 81 MG tablet ?Take 81 mg by mouth at bedtime. ?  ?cefdinir 300 MG capsule ?Commonly known as: OMNICEF ?Take 2 capsules (600 mg total) by mouth daily for 2 days. ?  ?docusate sodium 100 MG capsule ?Commonly known as: COLACE ?Take 1 capsule by mouth as needed. ?  ?HYDROcodone-acetaminophen 5-325 MG tablet ?Commonly known as: NORCO/VICODIN ?Take 1 tablet by mouth 2 (two) times daily as needed. ?  ?ICaps Areds 2 Caps ?Take 1 capsule by mouth 2 (two) times daily. ?What changed: Another medication with the same name was removed. Continue taking this medication, and follow the directions you see here. ?  ?isosorbide mononitrate 30 MG 24 hr tablet ?Commonly known as: IMDUR ?Take 30  mg by mouth daily. ?  ?naproxen sodium 220 MG tablet ?Commonly known as: ALEVE ?Take 220 mg by mouth daily. ?  ?pantoprazole 40 MG tablet ?Commonly known as: PROTONIX ?Take 1 tablet (40 mg total) by mouth 2 (two) times daily. ?  ?polyethylene glycol 17 g packet ?Commonly known as: MIRALAX / GLYCOLAX ?Take 17 g by mouth daily. ?  ?pravastatin 40 MG tablet ?Commonly known as: PRAVACHOL ?Take 40 mg by mouth at bedtime. ?  ?pregabalin 50 MG capsule ?Commonly known as: LYRICA ?Take 50 mg by  mouth 2 (two) times daily. ?  ?Prevagen 10 MG Caps ?Generic drug: Apoaequorin ?Take 1 capsule by mouth daily. ?  ? ?  ? ? ?Discharge Exam: ?Filed Weights  ? 12/12/21 1646 12/14/21 0510 12/15/21 0500  ?Weight: 83.5 kg 84.4 kg 83 kg  ? ?Physical Exam ?Constitutional:   ?   General: He is not in acute distress. ?   Appearance: Normal appearance.  ?HENT:  ?   Head: Normocephalic and atraumatic.  ?   Comments: Mostly healed abrasion on forehead ?   Mouth/Throat:  ?   Mouth: Mucous membranes are moist.  ?Eyes:  ?   Extraocular Movements: Extraocular movements intact.  ?Cardiovascular:  ?   Rate and Rhythm: Normal rate and regular rhythm.  ?   Heart sounds: Normal heart sounds.  ?Pulmonary:  ?   Effort: Pulmonary effort is normal. No respiratory distress.  ?   Breath sounds: Normal breath sounds. No wheezing.  ?Abdominal:  ?   General: Bowel sounds are normal. There is no distension.  ?   Palpations: Abdomen is soft.  ?   Tenderness: There is no abdominal tenderness.  ?Musculoskeletal:     ?   General: Normal range of motion.  ?   Cervical back: Normal range of motion and neck supple.  ?Skin: ?   General: Skin is warm and dry.  ?Neurological:  ?   General: No focal deficit present.  ?   Mental Status: He is alert.  ?Psychiatric:     ?   Mood and Affect: Mood normal.     ?   Behavior: Behavior normal.  ? ? ? ?Condition at discharge: stable ? ?The results of significant diagnostics from this hospitalization (including imaging, microbiology, ancillary and laboratory) are listed below for reference.  ? ?Imaging Studies: ?CT HEAD WO CONTRAST (5MM) ? ?Result Date: 12/12/2021 ?CLINICAL DATA:  Head and neck trauma. Fall. Altered mental status. Dizziness. EXAM: CT HEAD WITHOUT CONTRAST CT CERVICAL SPINE WITHOUT CONTRAST TECHNIQUE: Multidetector CT imaging of the head and cervical spine was performed following the standard protocol without intravenous contrast. Multiplanar CT image reconstructions of the cervical spine were also  generated. RADIATION DOSE REDUCTION: This exam was performed according to the departmental dose-optimization program which includes automated exposure control, adjustment of the mA and/or kV according to patient size and/or use of iterative reconstruction technique. COMPARISON:  Head CT 05/13/2010.  Neck CTA 02/17/2010. FINDINGS: CT HEAD FINDINGS Brain: There is no evidence of an acute infarct, intracranial hemorrhage, mass, midline shift, or extra-axial fluid collection. Generalized cerebral atrophy is mild for age. Hypodensities in the cerebral white matter bilaterally are nonspecific but compatible with mild chronic small vessel ischemic disease. Vascular: Calcified atherosclerosis at the skull base. Skull: No fracture or suspicious osseous lesion. Sinuses/Orbits: Mild mucosal thickening in the right ethmoid and right maxillary sinuses. No significant mastoid fluid. Bilateral cataract extraction. Other: None. CT CERVICAL SPINE FINDINGS Alignment: Straightening of the normal cervical  lordosis. Trace anterolisthesis of C6 on C7 and C7 on T1, likely degenerative and facet mediated. Skull base and vertebrae: No acute fracture or suspicious osseous lesion. Soft tissues and spinal canal: No prevertebral fluid or swelling. No visible canal hematoma. Disc levels: Advanced disc degeneration including severe disc space narrowing and prominent degenerative endplate changes at O9-7, C4-5, and C5-6 with at least moderate spinal stenosis at these levels. Advanced cervical facet arthrosis, overall left worse than right. Severe multilevel neural foraminal stenosis. Upper chest: Clear lung apices. Other: Mild-to-moderate calcific atherosclerosis at the left carotid bifurcation. IMPRESSION: 1. No evidence of acute intracranial abnormality. 2. No acute cervical spine fracture. 3. Advanced cervical disc and facet degeneration with multilevel spinal and neural foraminal stenosis. Electronically Signed   By: Logan Bores M.D.   On:  12/12/2021 18:46  ? ?CT Cervical Spine Wo Contrast ? ?Result Date: 12/12/2021 ?CLINICAL DATA:  Head and neck trauma. Fall. Altered mental status. Dizziness. EXAM: CT HEAD WITHOUT CONTRAST CT CERVICAL SPINE [facility]

## 2021-12-22 DIAGNOSIS — N419 Inflammatory disease of prostate, unspecified: Secondary | ICD-10-CM | POA: Diagnosis not present

## 2021-12-29 DIAGNOSIS — E860 Dehydration: Secondary | ICD-10-CM | POA: Diagnosis not present

## 2021-12-29 DIAGNOSIS — I252 Old myocardial infarction: Secondary | ICD-10-CM | POA: Diagnosis not present

## 2021-12-29 DIAGNOSIS — I2510 Atherosclerotic heart disease of native coronary artery without angina pectoris: Secondary | ICD-10-CM | POA: Diagnosis not present

## 2021-12-29 DIAGNOSIS — R197 Diarrhea, unspecified: Secondary | ICD-10-CM | POA: Diagnosis not present

## 2021-12-29 DIAGNOSIS — N179 Acute kidney failure, unspecified: Secondary | ICD-10-CM | POA: Diagnosis not present

## 2021-12-29 DIAGNOSIS — I10 Essential (primary) hypertension: Secondary | ICD-10-CM | POA: Diagnosis not present

## 2021-12-29 DIAGNOSIS — N3000 Acute cystitis without hematuria: Secondary | ICD-10-CM | POA: Diagnosis not present

## 2022-01-04 DIAGNOSIS — R350 Frequency of micturition: Secondary | ICD-10-CM | POA: Diagnosis not present

## 2022-01-04 DIAGNOSIS — R32 Unspecified urinary incontinence: Secondary | ICD-10-CM | POA: Diagnosis not present

## 2022-01-04 DIAGNOSIS — H353131 Nonexudative age-related macular degeneration, bilateral, early dry stage: Secondary | ICD-10-CM | POA: Diagnosis not present

## 2022-01-04 DIAGNOSIS — R8290 Unspecified abnormal findings in urine: Secondary | ICD-10-CM | POA: Diagnosis not present

## 2022-01-06 ENCOUNTER — Ambulatory Visit: Payer: Self-pay | Admitting: Urology

## 2022-01-14 ENCOUNTER — Ambulatory Visit: Payer: Medicare HMO | Admitting: Urology

## 2022-01-14 ENCOUNTER — Encounter: Payer: Self-pay | Admitting: Urology

## 2022-01-14 VITALS — BP 176/69 | HR 85 | Ht 70.0 in | Wt 188.8 lb

## 2022-01-14 DIAGNOSIS — R339 Retention of urine, unspecified: Secondary | ICD-10-CM

## 2022-01-14 DIAGNOSIS — R32 Unspecified urinary incontinence: Secondary | ICD-10-CM

## 2022-01-14 LAB — URINALYSIS, COMPLETE
Bilirubin, UA: NEGATIVE
Glucose, UA: NEGATIVE
Ketones, UA: NEGATIVE
Leukocytes,UA: NEGATIVE
Nitrite, UA: NEGATIVE
Protein,UA: NEGATIVE
Specific Gravity, UA: 1.025 (ref 1.005–1.030)
Urobilinogen, Ur: 0.2 mg/dL (ref 0.2–1.0)
pH, UA: 6 (ref 5.0–7.5)

## 2022-01-14 LAB — MICROSCOPIC EXAMINATION: Bacteria, UA: NONE SEEN

## 2022-01-14 LAB — BLADDER SCAN AMB NON-IMAGING

## 2022-01-14 MED ORDER — GEMTESA 75 MG PO TABS: 75.0000 mg | ORAL_TABLET | Freq: Every day | ORAL | 0 refills | Status: DC

## 2022-01-14 NOTE — Progress Notes (Signed)
? ?01/14/2022 ?10:17 AM  ? ?Travis Schultz ?27-Jun-1926 ?[id number] ? ?Referring provider: [name], Travis Schultz ?[place],  [place] [address] ? ?Chief Complaint  ?Patient presents with  ? Urinary Incontinence  ? ? ?HPI: ?Travis Schultz is a 88 y.o. male referred for evaluation of urinary incontinence.  He presents today with his fianc?e ? ?Symptoms present ~ 5 years but worsening recently ?Bothersome lower urinary tract symptoms including urinary frequency, urgency and urinary incontinence ?Nocturia x3-4 ?Difficult to characterize the nature of his incontinence ?History of recurrent UTI with several + cultures for Klebsiella ?Was hospitalized [facility] last month for E. coli UTI with bacteremia ?CT during that hospitalization showed a stable right renal sinus cyst and mild bladder distention.  No urinary tract calculi were seen ?History prostate cancer status post radical prostatectomy at [facility] in 1993 ?IPSS today 9/35 ? ? ?PMH: ? Anemia  ? Arthritis  ? CAD (coronary artery disease)  ? Cervicalgia  ? Chickenpox  ? Depression  ? ED (erectile dysfunction)  ? GERD (gastroesophageal reflux disease)  ? Gilbert's disease  ? HLD (hyperlipidemia)  ? HTN (hypertension)  ? Non Hodgkin's lymphoma (CMS-HCC)  ? Prostate cancer (CMS-HCC)  ? Sinoatrial node dysfunction (CMS-HCC)  ? Spinal stenosis  ? ?Surgical History: ? back surgery 1971  ? lymphoma surgery 1993  ? prostate surgery 1993  ? CHOLECYSTECTOMY 2007  ? ARTHROSCOPIC ROTATOR CUFF REPAIR 02/2007  ? APPENDECTOMY  ? CAROTID ENDARTERECTOMY  ?right  ? CORONARY ARTERY BYPASS GRAFT  ? ?Home Medications:  ?Allergies as of 01/14/2022   ? ?   Reactions  ? Phenergan [promethazine Hcl]   ? Altered mental status  ? ?  ? ?  ?Medication List  ?  ? ?  ? Accurate as of January 14, 2022 10:17 AM. If you have any questions, ask your nurse or doctor.  ?  ?  ? ?  ? ?acetaminophen 325 MG tablet ?Commonly known as: TYLENOL ?Take 325 mg by mouth at  bedtime. ?  ?aspirin 81 MG tablet ?Take 81 mg by mouth at bedtime. ?  ?docusate sodium 100 MG capsule ?Commonly known as: COLACE ?Take 1 capsule by mouth as needed. ?  ?HYDROcodone-acetaminophen 5-325 MG tablet ?Commonly known as: NORCO/VICODIN ?Take 1 tablet by mouth 2 (two) times daily as needed. ?  ?ICaps Areds 2 Caps ?Take 1 capsule by mouth 2 (two) times daily. ?  ?isosorbide mononitrate 30 MG 24 hr tablet ?Commonly known as: IMDUR ?Take 30 mg by mouth daily. ?  ?naproxen sodium 220 MG tablet ?Commonly known as: ALEVE ?Take 220 mg by mouth daily. ?  ?pantoprazole 40 MG tablet ?Commonly known as: PROTONIX ?Take 1 tablet (40 mg total) by mouth 2 (two) times daily. ?  ?polyethylene glycol 17 g packet ?Commonly known as: MIRALAX / GLYCOLAX ?Take 17 g by mouth daily. ?  ?pravastatin 40 MG tablet ?Commonly known as: PRAVACHOL ?Take 40 mg by mouth at bedtime. ?  ?pregabalin 50 MG capsule ?Commonly known as: LYRICA ?Take 50 mg by mouth 2 (two) times daily. ?  ?Prevagen 10 MG Caps ?Generic drug: Apoaequorin ?Take 1 capsule by mouth daily. ?  ? ?  ? ? ?Allergies:  ?Allergies  ?Allergen Reactions  ? Phenergan [Promethazine Hcl]   ?  Altered mental status  ? ? ?Family History: ?Family History  ?Problem Relation Age of Onset  ? Heart disease Father   ? Heart disease Brother   ? ? ?  Social History:  reports that he has never smoked. He has never used smokeless tobacco. He reports that he does not drink alcohol and does not use drugs. ? ? ?Physical Exam: ?BP (!) 176/69 (BP Location: Left Arm, Patient Position: Sitting, Cuff Size: Normal)   Pulse 85   Ht '5\' 10"'$  (1.778 m)   Wt 188 lb 12.8 oz (85.6 kg)   BMI 27.09 kg/m?   ?Constitutional:  Alert, no acute distress. ?HEENT: [place] AT, moist mucus membranes.  Trachea midline, no masses. ?Cardiovascular: No clubbing, cyanosis, or edema. ?Respiratory: Normal respiratory effort, no increased work of breathing. ?Psychiatric: Normal mood and affect. ? ? ?Pertinent Imaging: ?CT images of  March 2023 were personally reviewed and interpreted ? ?Assessment & Plan:   ? ?1. Urinary incontinence, unspecified type ?Difficult to characterize his type of incontinence ?Trial Gemtesa 75 mg daily ? ?2.  Incomplete bladder emptying ?PVR 185 mL ?We discussed potential causes and including urethral stricture/bladder neck contracture from prior prostatectomy.  The possibility of neurogenic bladder was also discussed ?Recommend cystoscopy to evaluate for outlet obstruction ? ?3.  History of prostate cancer ? ? ?Travis Sons, Travis Schultz ? ?[facility] ?[address] ?[place], [place] [address] ?[phone] ? ?

## 2022-01-28 ENCOUNTER — Telehealth: Payer: Self-pay | Admitting: Urology

## 2022-01-28 DIAGNOSIS — R32 Unspecified urinary incontinence: Secondary | ICD-10-CM

## 2022-01-28 MED ORDER — GEMTESA 75 MG PO TABS: 75.0000 mg | ORAL_TABLET | Freq: Every day | ORAL | 11 refills | Status: DC

## 2022-01-28 NOTE — Telephone Encounter (Signed)
RX sent

## 2022-01-28 NOTE — Telephone Encounter (Signed)
PT'S wife, Maggie Font that pt was given samples of Logan Bores and is out.  They would like an RX sent in to [facility]. ?

## 2022-02-17 ENCOUNTER — Ambulatory Visit: Payer: Medicare HMO | Admitting: Urology

## 2022-02-17 ENCOUNTER — Encounter: Payer: Self-pay | Admitting: Urology

## 2022-02-17 VITALS — BP 170/67 | HR 77 | Ht 70.0 in | Wt 191.0 lb

## 2022-02-17 DIAGNOSIS — R32 Unspecified urinary incontinence: Secondary | ICD-10-CM | POA: Diagnosis not present

## 2022-02-17 DIAGNOSIS — R339 Retention of urine, unspecified: Secondary | ICD-10-CM | POA: Diagnosis not present

## 2022-02-17 LAB — URINALYSIS, COMPLETE
Bilirubin, UA: NEGATIVE
Glucose, UA: NEGATIVE
Ketones, UA: NEGATIVE
Leukocytes,UA: NEGATIVE
Nitrite, UA: NEGATIVE
Protein,UA: NEGATIVE
RBC, UA: NEGATIVE
Specific Gravity, UA: 1.015 (ref 1.005–1.030)
Urobilinogen, Ur: 0.2 mg/dL (ref 0.2–1.0)
pH, UA: 5.5 (ref 5.0–7.5)

## 2022-02-17 LAB — MICROSCOPIC EXAMINATION: Bacteria, UA: NONE SEEN

## 2022-02-17 MED ORDER — SILODOSIN 4 MG PO CAPS: 4.0000 mg | ORAL_CAPSULE | Freq: Every day | ORAL | 0 refills | Status: DC

## 2022-02-17 NOTE — Progress Notes (Signed)
   02/17/22  CC:  Chief Complaint  Patient presents with   Cysto    HPI: Refer to my previous note on 01/14/2022.  No significant symptom improvement on Gemtesa  Blood pressure (!) 170/67, pulse 77, height '5\' 10"'$  (1.778 m), weight 191 lb (86.6 kg). NED. A&Ox3.   No respiratory distress   Abd soft, NT, ND Normal phallus with bilateral descended testicles  Cystoscopy Procedure Note  Patient identification was confirmed, informed consent was obtained, and patient was prepped using Betadine solution.  Lidocaine jelly was administered per urethral meatus.     Pre-Procedure: - Inspection reveals a normal caliber urethral meatus.  Procedure: The flexible cystoscope was introduced without difficulty - No urethral strictures/lesions are present. - Surgically absent prostate  - Area just proximal to sphincter was tight but no stricture - Bilateral ureteral orifices identified - Bladder mucosa  reveals no ulcers, tumors, or lesions - No bladder stones - No trabeculation  Retroflexion shows no abnormalities   Post-Procedure: - Patient tolerated the procedure well  Assessment/ Plan: No evidence of stricture/bladder neck contracture "tightness" just proximal to sphincter which appeared to improve after cystoscope passage Unclear if an alpha-blocker would be effective.  We will give a trial of silodosin 4 mg daily PA follow-up 1 month for symptom reassessment and PVR If still symptomatic consider seeing Dr. Matilde Sprang for a second opinion   Abbie Sons, MD

## 2022-03-09 ENCOUNTER — Other Ambulatory Visit: Payer: Self-pay | Admitting: Urology

## 2022-03-09 DIAGNOSIS — H903 Sensorineural hearing loss, bilateral: Secondary | ICD-10-CM | POA: Diagnosis not present

## 2022-03-09 DIAGNOSIS — H6123 Impacted cerumen, bilateral: Secondary | ICD-10-CM | POA: Diagnosis not present

## 2022-03-11 DIAGNOSIS — Z79899 Other long term (current) drug therapy: Secondary | ICD-10-CM | POA: Diagnosis not present

## 2022-03-11 DIAGNOSIS — M48061 Spinal stenosis, lumbar region without neurogenic claudication: Secondary | ICD-10-CM | POA: Diagnosis not present

## 2022-03-11 DIAGNOSIS — M5416 Radiculopathy, lumbar region: Secondary | ICD-10-CM | POA: Diagnosis not present

## 2022-03-11 DIAGNOSIS — M5136 Other intervertebral disc degeneration, lumbar region: Secondary | ICD-10-CM | POA: Diagnosis not present

## 2022-03-21 ENCOUNTER — Encounter: Payer: Self-pay | Admitting: Physician Assistant

## 2022-03-21 ENCOUNTER — Ambulatory Visit: Payer: Medicare HMO | Admitting: Physician Assistant

## 2022-03-21 VITALS — BP 132/55 | HR 77 | Ht 70.0 in | Wt 185.0 lb

## 2022-03-21 DIAGNOSIS — R339 Retention of urine, unspecified: Secondary | ICD-10-CM | POA: Diagnosis not present

## 2022-03-21 LAB — BLADDER SCAN AMB NON-IMAGING

## 2022-03-21 MED ORDER — SILODOSIN 4 MG PO CAPS: 4.0000 mg | ORAL_CAPSULE | Freq: Every day | ORAL | 3 refills | Status: DC

## 2022-03-21 NOTE — Progress Notes (Signed)
03/21/2022 12:51 PM   Travis Schultz [birth year] [id number]  CC: Chief Complaint  Patient presents with   Follow-up   HPI: Travis Schultz is a 88 y.o. male with PMH prostate cancer s/p radical prostatectomy at [facility] in 1993 and bothersome LUTS including urinary incontinence and nocturia who presents today for symptom recheck on Rapaflo.  He is accompanied today by Travis Schultz, who contributes to HPI.  He underwent cystoscopy with Dr. Bernardo Heater on 02/17/2022.  The only significant finding was some subtle tightness just proximal to the sphincter without stricture or bladder neck contracture.  Today he reports his urinary leakage is largely without awareness.  It significantly improved on Rapaflo, however he ran out of this medication approximately 2 weeks ago and has been unable to have it refilled since.  He wishes to resume the medication and denies orthostasis on it.  He also reports nocturia x2-3, possibly improved on Rapaflo.  He denies BLE edema, avoids fluids after dinner, and states he snores "not much."  PVR 146 mL.  PMH: Past Medical History:  Diagnosis Date   Actinic keratosis    Lymphoma ([place]) 1991   Myocardial infarction ([place]) 06/17/2009   5 bypass's    Surgical History: Past Surgical History:  Procedure Laterality Date   BACK SURGERY     after injury to back    CARDIAC SURGERY     CAROTID ARTERY ANGIOPLASTY N/A 02/24/2010   CATARACT EXTRACTION Bilateral 2012   EYE SURGERY Bilateral 2012   PROSTATE SURGERY     ROTATOR CUFF REPAIR Right 2008    Home Medications:  Allergies as of 03/21/2022       Reactions   Phenergan [promethazine Hcl]    Altered mental status   Promethazine Hcl         Medication List        Accurate as of March 21, 2022 12:51 PM. If you have any questions, ask your nurse or doctor.          STOP taking these medications    Gemtesa 75 MG Tabs Generic drug: Vibegron Stopped by: Debroah Loop, PA-C    HYDROcodone-acetaminophen 5-325 MG tablet Commonly known as: NORCO/VICODIN Stopped by: Debroah Loop, PA-C   Prevagen 10 MG Caps Generic drug: Apoaequorin Stopped by: Debroah Loop, PA-C       TAKE these medications    acetaminophen 325 MG tablet Commonly known as: TYLENOL Take 325 mg by mouth at bedtime.   aspirin 81 MG tablet Take 81 mg by mouth at bedtime.   docusate sodium 100 MG capsule Commonly known as: COLACE Take 1 capsule by mouth as needed.   ICaps Areds 2 Caps Take 1 capsule by mouth 2 (two) times daily.   isosorbide mononitrate 30 MG 24 hr tablet Commonly known as: IMDUR Take 30 mg by mouth daily.   naproxen sodium 220 MG tablet Commonly known as: ALEVE Take 220 mg by mouth daily.   pantoprazole 40 MG tablet Commonly known as: PROTONIX Take 1 tablet (40 mg total) by mouth 2 (two) times daily.   polyethylene glycol 17 g packet Commonly known as: MIRALAX / GLYCOLAX Take 17 g by mouth daily.   pravastatin 40 MG tablet Commonly known as: PRAVACHOL Take 40 mg by mouth at bedtime.   pregabalin 50 MG capsule Commonly known as: LYRICA Take 50 mg by mouth 2 (two) times daily.   silodosin 4 MG Caps capsule Commonly known as: RAPAFLO Take 1 capsule (4 mg total) by mouth daily  with breakfast.        Allergies:  Allergies  Allergen Reactions   Phenergan [Promethazine Hcl]     Altered mental status   Promethazine Hcl     Family History: Family History  Problem Relation Age of Onset   Heart disease Father    Heart disease Brother     Social History:   reports that he has never smoked. He has never used smokeless tobacco. He reports that he does not drink alcohol and does not use drugs.  Physical Exam: BP (!) 132/55 (BP Location: Left Arm, Patient Position: Sitting, Cuff Size: Normal)   Pulse 77   Ht '5\' 10"'$  (1.778 m)   Wt 185 lb (83.9 kg)   BMI 26.54 kg/m   Constitutional:  Alert and oriented, no acute distress,  nontoxic appearing HEENT: [place], AT Cardiovascular: No clubbing, cyanosis, or edema Respiratory: Normal respiratory effort, no increased work of breathing Skin: No rashes, bruises or suspicious lesions Neurologic: Grossly intact, no focal deficits, moving all 4 extremities Psychiatric: Normal mood and affect  Laboratory Data: Results for orders placed or performed in visit on 03/21/22  Bladder Scan (Post Void Residual) in office  Result Value Ref Range   Scan Result 183m    Assessment & Plan:   1. Incomplete bladder emptying Urinary leakage appears to be insensate and largely resolved on Rapaflo.  We will resume this medication, as he tolerates it well. - Bladder Scan (Post Void Residual) in office - silodosin (RAPAFLO) 4 MG CAPS capsule; Take 1 capsule (4 mg total) by mouth daily with breakfast.  Dispense: 90 capsule; Refill: 3  Return in about 1 year (around 03/22/2023) for Symptom recheck with PVR.  SDebroah Loop PA-C  [facility] [address] [place] [phone]

## 2022-04-22 DIAGNOSIS — I5022 Chronic systolic (congestive) heart failure: Secondary | ICD-10-CM | POA: Diagnosis not present

## 2022-04-22 DIAGNOSIS — R7303 Prediabetes: Secondary | ICD-10-CM | POA: Diagnosis not present

## 2022-04-22 DIAGNOSIS — I25119 Atherosclerotic heart disease of native coronary artery with unspecified angina pectoris: Secondary | ICD-10-CM | POA: Diagnosis not present

## 2022-04-22 DIAGNOSIS — Z0000 Encounter for general adult medical examination without abnormal findings: Secondary | ICD-10-CM | POA: Diagnosis not present

## 2022-05-26 DIAGNOSIS — I6523 Occlusion and stenosis of bilateral carotid arteries: Secondary | ICD-10-CM | POA: Diagnosis not present

## 2022-05-26 DIAGNOSIS — I5022 Chronic systolic (congestive) heart failure: Secondary | ICD-10-CM | POA: Diagnosis not present

## 2022-05-26 DIAGNOSIS — E782 Mixed hyperlipidemia: Secondary | ICD-10-CM | POA: Diagnosis not present

## 2022-05-26 DIAGNOSIS — I25118 Atherosclerotic heart disease of native coronary artery with other forms of angina pectoris: Secondary | ICD-10-CM | POA: Diagnosis not present

## 2022-05-26 DIAGNOSIS — I10 Essential (primary) hypertension: Secondary | ICD-10-CM | POA: Diagnosis not present

## 2022-07-05 DIAGNOSIS — H353131 Nonexudative age-related macular degeneration, bilateral, early dry stage: Secondary | ICD-10-CM | POA: Diagnosis not present

## 2022-07-05 DIAGNOSIS — Z0100 Encounter for examination of eyes and vision without abnormal findings: Secondary | ICD-10-CM | POA: Diagnosis not present

## 2022-07-22 IMAGING — CT CT HEAD W/O CM
4 series · 16 of 47 positions shown, 18 images · non-contrast
Comparison: Head CT 05/13/2010.  Neck CTA 02/17/2010.

CLINICAL DATA: Head and neck trauma. Fall. Altered mental status.
Dizziness.



[Series 2: head wo · axial · 0.46mm/px · z∈[-87,+33]mm · 7 of 34 slices shown, 9 images]
[im 5/34  brain]
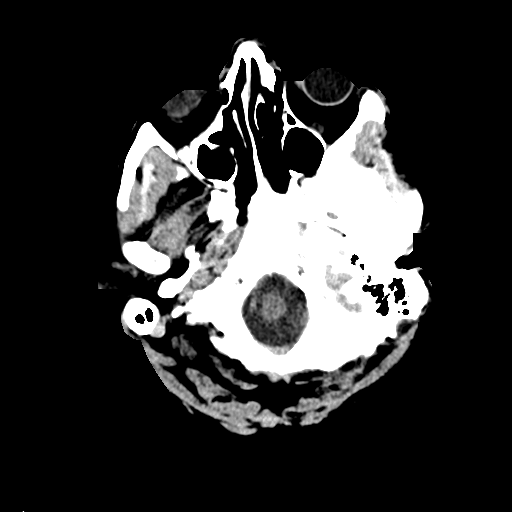
[im 5/34  bone]
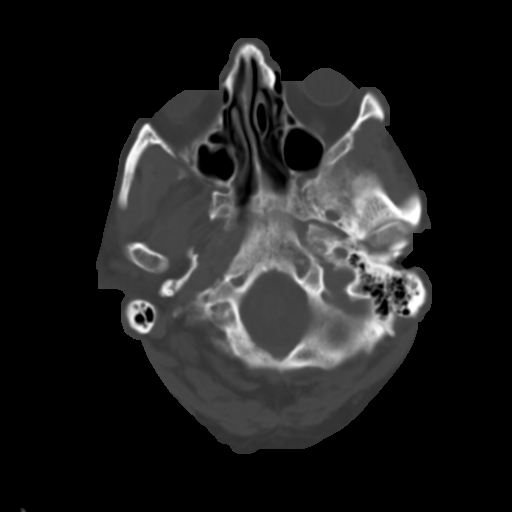
[im 9/34  brain]
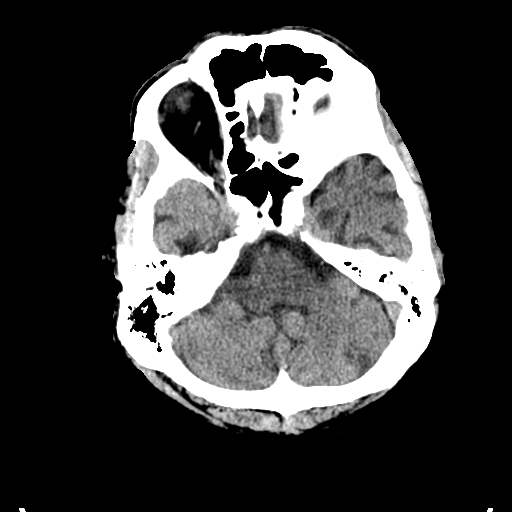
[im 13/34  brain]
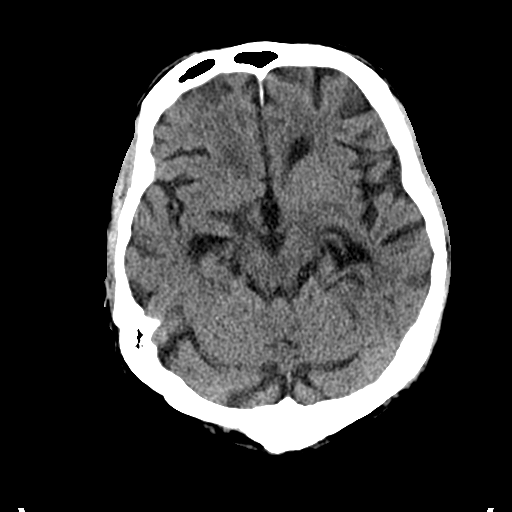
[im 17/34  brain]
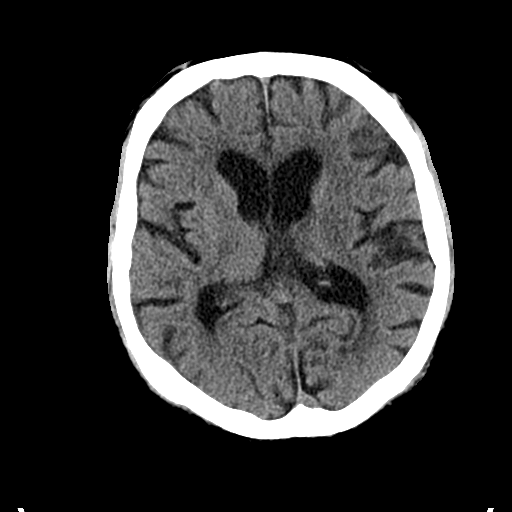
[im 21/34  brain]
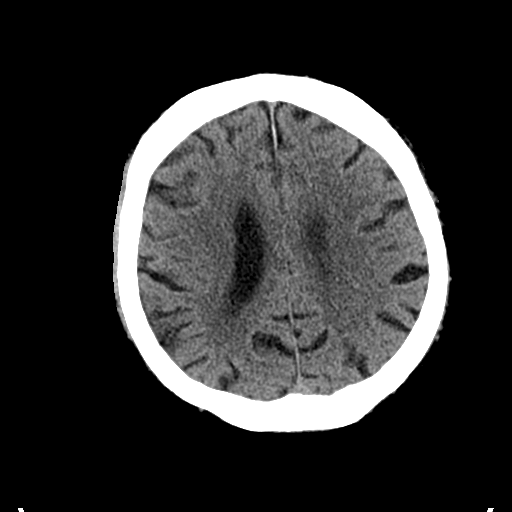
[im 21/34  bone]
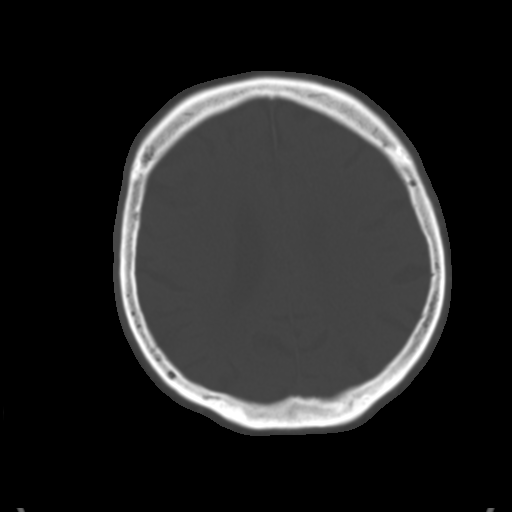
[im 25/34  brain]
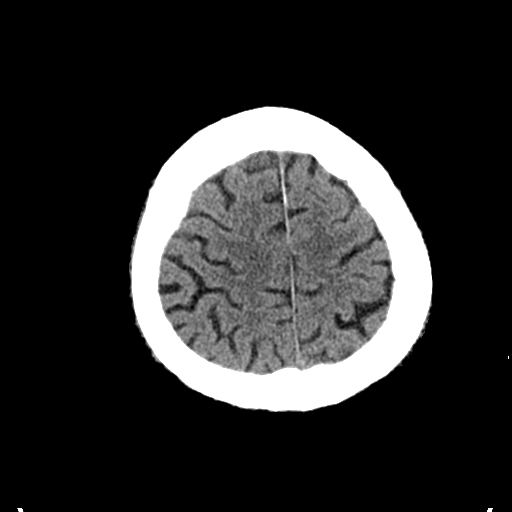
[im 29/34  brain]
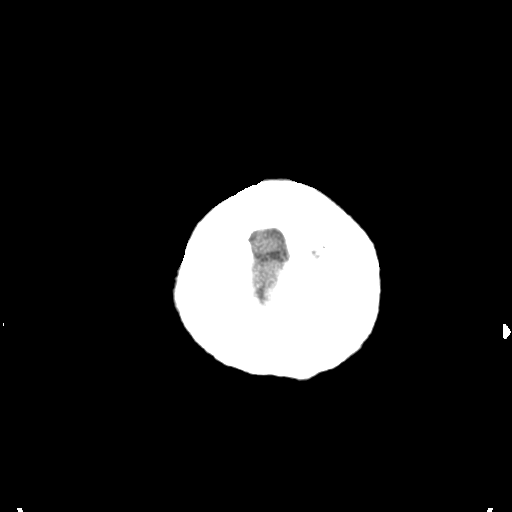

[Series 3: head bone · axial · 0.46mm/px · z∈[-91,-57]mm · 3 of 85 slices shown]
[im 9/85  bone]
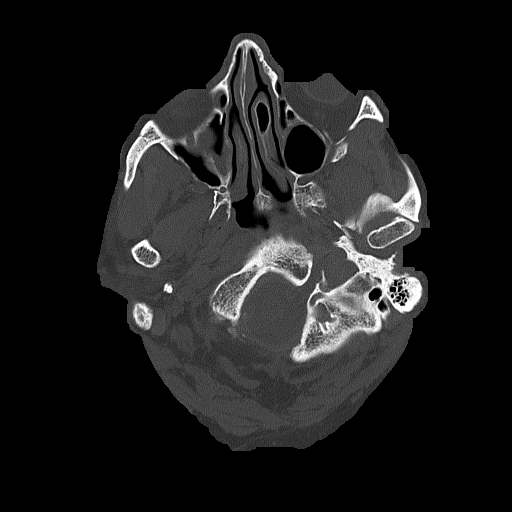
[im 17/85  bone]
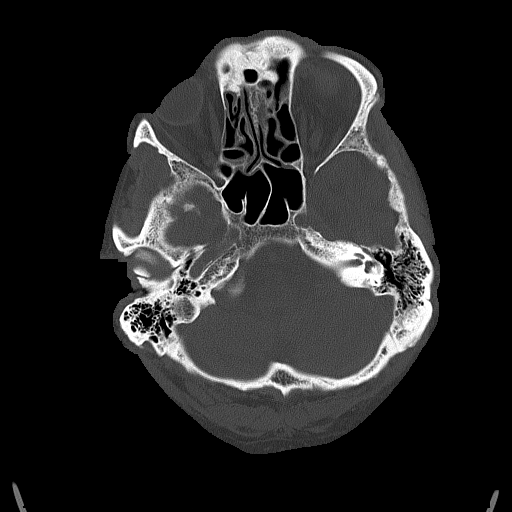
[im 26/85  bone]
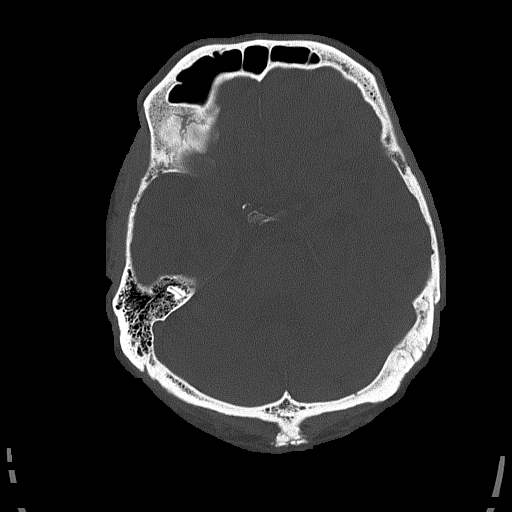

[Series 4: coronal soft tissue · coronal · 0.36mm/px · 3 of 75 slices shown]
[im 25/75  brain]
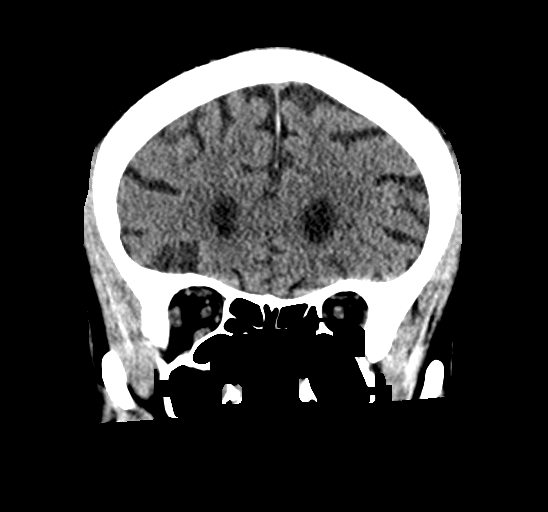
[im 33/75  brain]
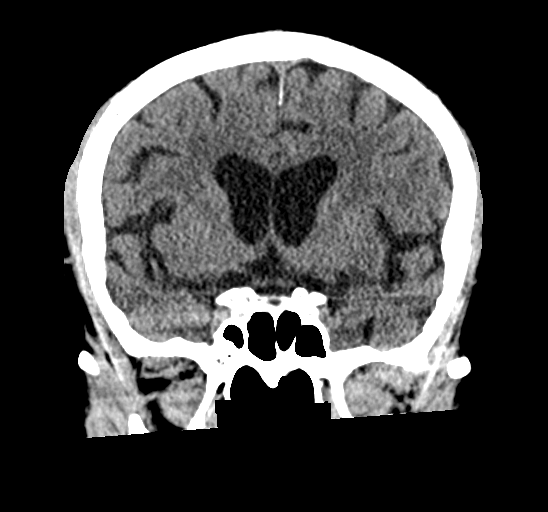
[im 42/75  brain]
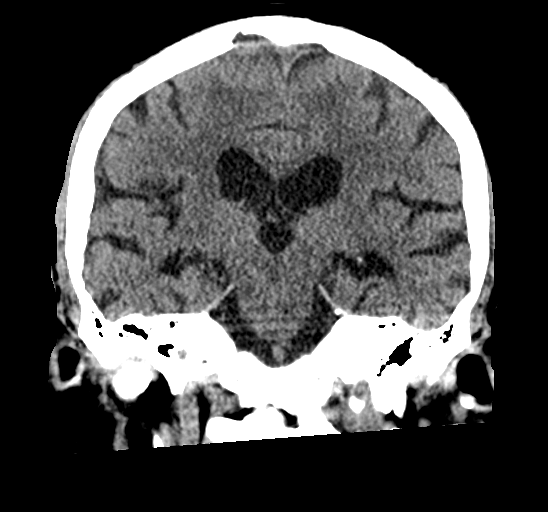

[Series 5: sagittal soft tissue · sagittal · 0.40mm/px · 3 of 63 slices shown]
[im 21/63  brain]
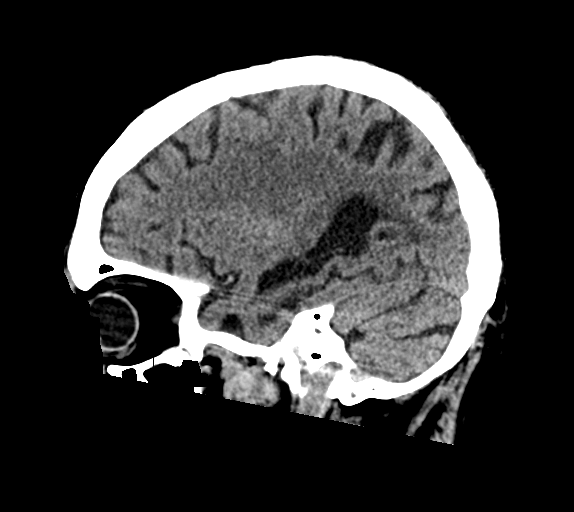
[im 32/63  brain]
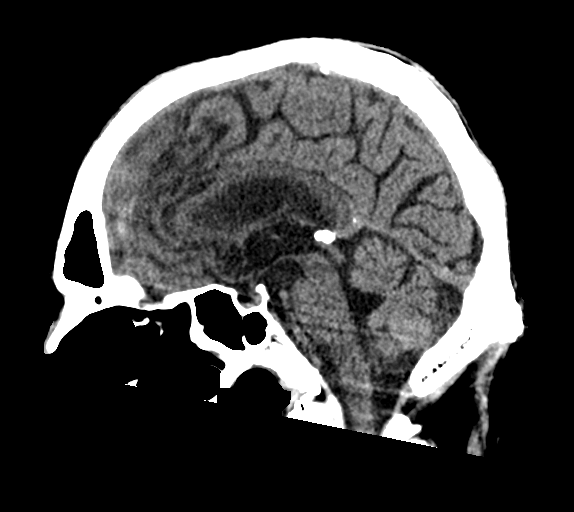
[im 42/63  brain]
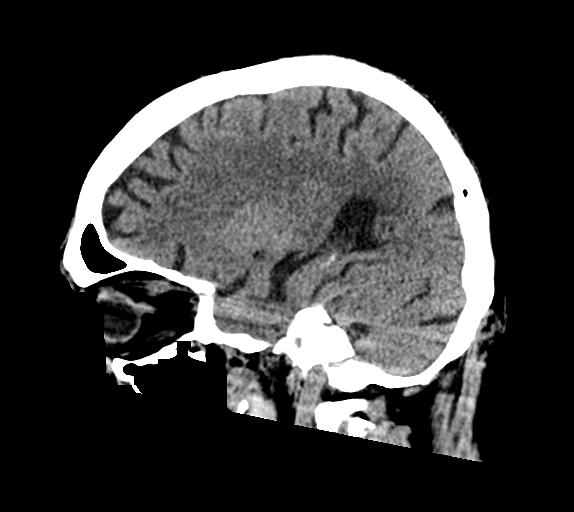

[16 of 47 positions shown; findings below may reference images not displayed]

FINDINGS: CT HEAD FINDINGS

Brain: There is no evidence of an acute infarct, intracranial
hemorrhage, mass, midline shift, or extra-axial fluid collection.
Generalized cerebral atrophy is mild for age. Hypodensities in the
cerebral white matter bilaterally are nonspecific but compatible
with mild chronic small vessel ischemic disease.

Vascular: Calcified atherosclerosis at the skull base.

Skull: No fracture or suspicious osseous lesion.

Sinuses/Orbits: Mild mucosal thickening in the right ethmoid and
right maxillary sinuses. No significant mastoid fluid. Bilateral
cataract extraction.

Other: None.

CT CERVICAL SPINE FINDINGS

Alignment: Straightening of the normal cervical lordosis. Trace
anterolisthesis of C6 on C7 and C7 on T1, likely degenerative and
facet mediated.

Skull base and vertebrae: No acute fracture or suspicious osseous
lesion.

Soft tissues and spinal canal: No prevertebral fluid or swelling. No
visible canal hematoma.

Disc levels: Advanced disc degeneration including severe disc space
narrowing and prominent degenerative endplate changes at C3-4, C4-5,
and C5-6 with at least moderate spinal stenosis at these levels.
Advanced cervical facet arthrosis, overall left worse than right.
Severe multilevel neural foraminal stenosis.

Upper chest: Clear lung apices.

Other: Mild-to-moderate calcific atherosclerosis at the left carotid
bifurcation.
IMPRESSION: 1. No evidence of acute intracranial abnormality.
2. No acute cervical spine fracture.
3. Advanced cervical disc and facet degeneration with multilevel
spinal and neural foraminal stenosis.

## 2022-07-22 IMAGING — CT CT CERVICAL SPINE W/O CM
3 of 4 series · 10 of 33 positions shown, 12 images · non-contrast
Comparison: Head CT 05/13/2010.  Neck CTA 02/17/2010.

CLINICAL DATA: Head and neck trauma. Fall. Altered mental status.
Dizziness.



[Series 6: orthogonal bone · axial · 0.31mm/px · z∈[-267,-176]mm · 2 of 115 slices shown, 3 images]
[im 33/115  soft-tissue]
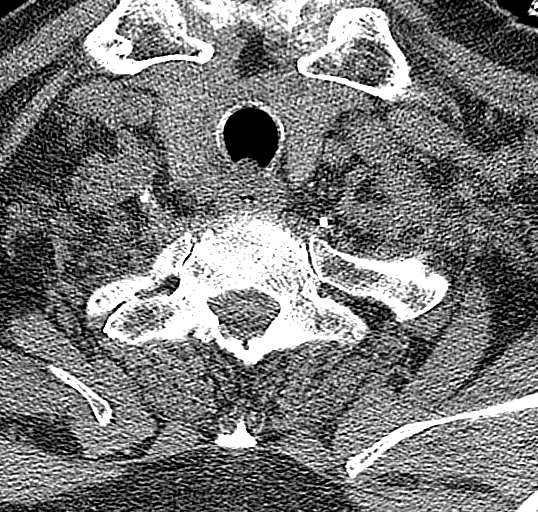
[im 33/115  bone]
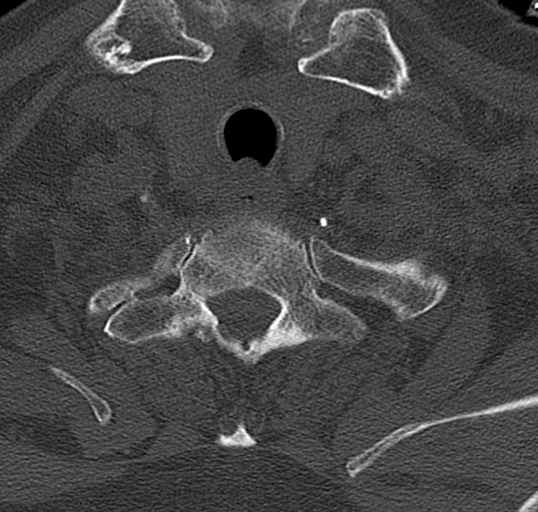
[im 82/115  bone]
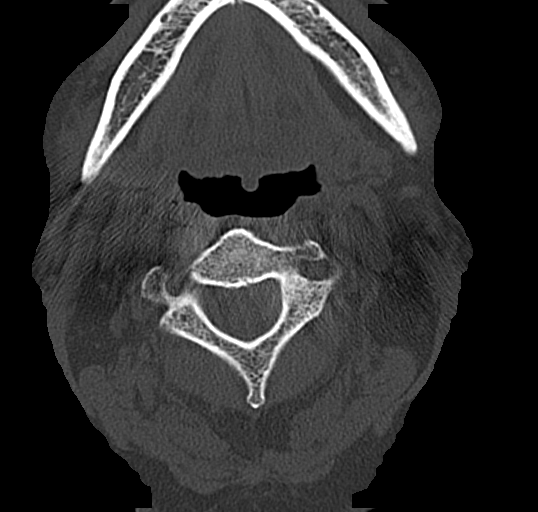

[Series 7: sagittal bone · sagittal · 0.31mm/px · 5 of 76 slices shown, 6 images]
[im 26/76  bone]
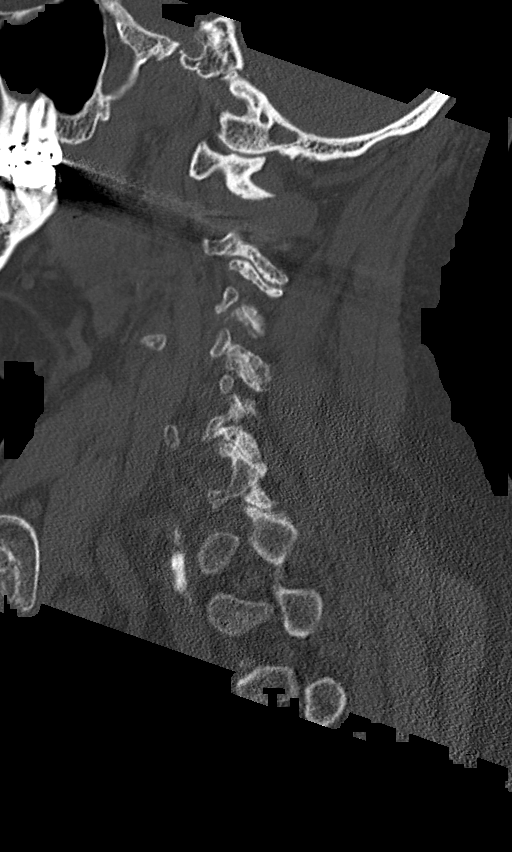
[im 32/76  bone]
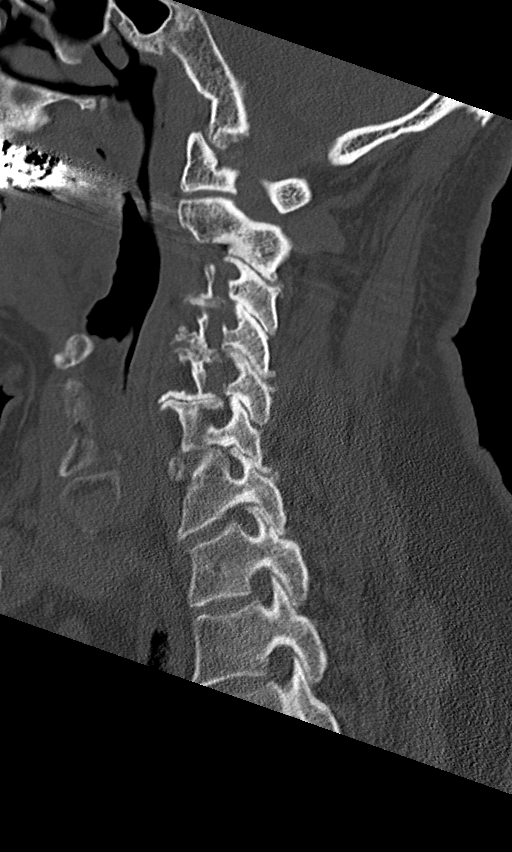
[im 38/76  soft-tissue]
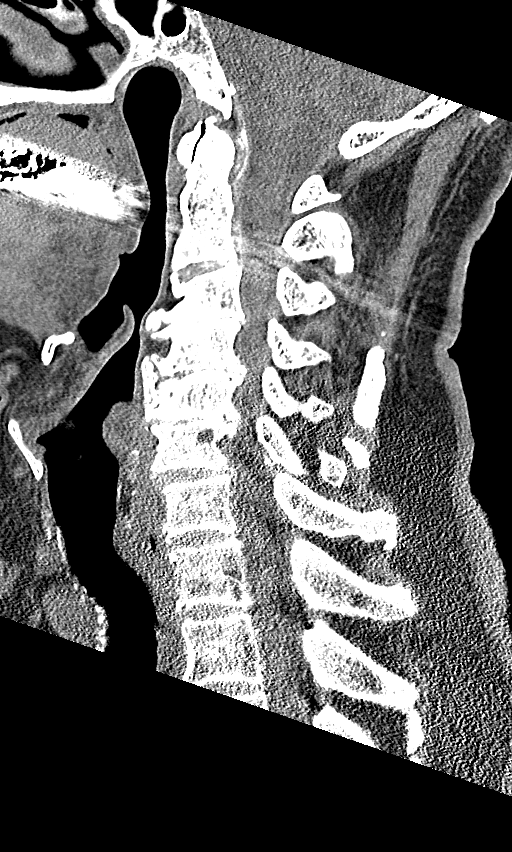
[im 38/76  bone]
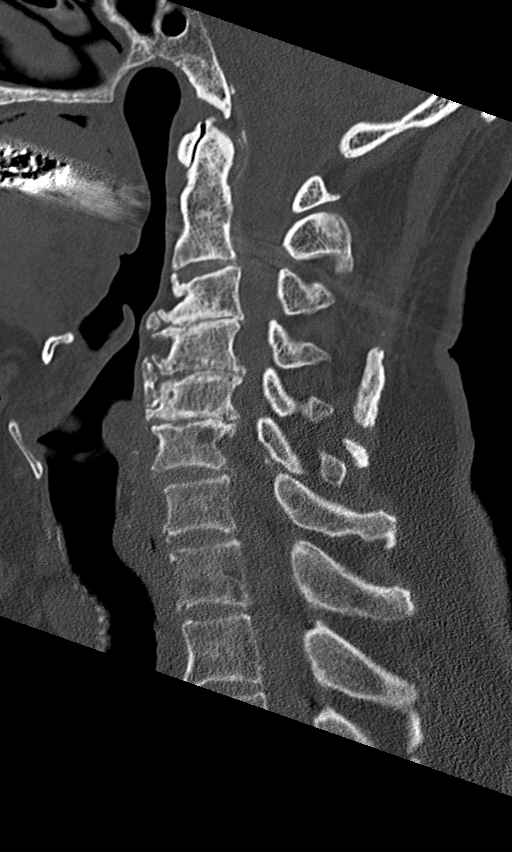
[im 44/76  bone]
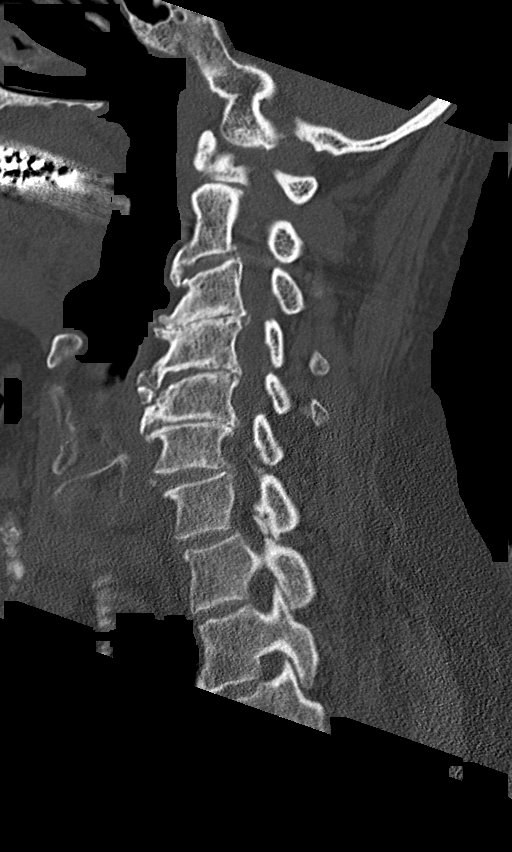
[im 51/76  bone]
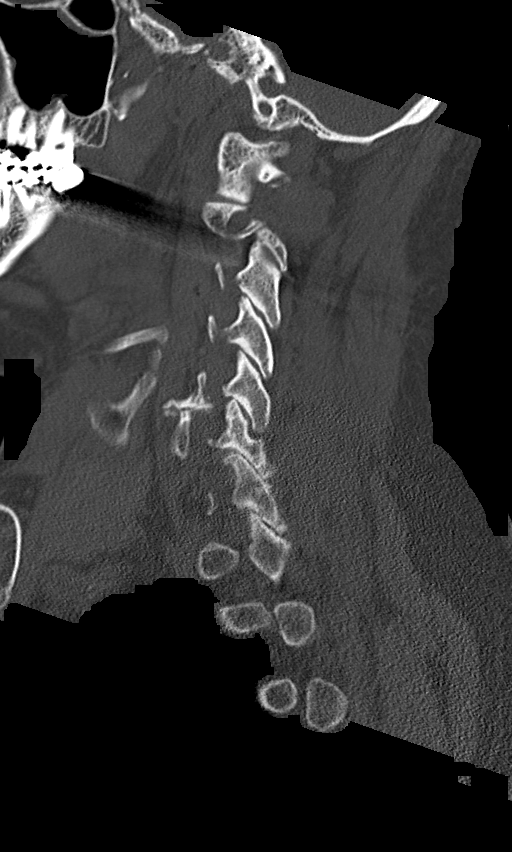

[Series 8: coronal bone · coronal · 0.27mm/px · 3 of 84 slices shown]
[im 21/84  bone]
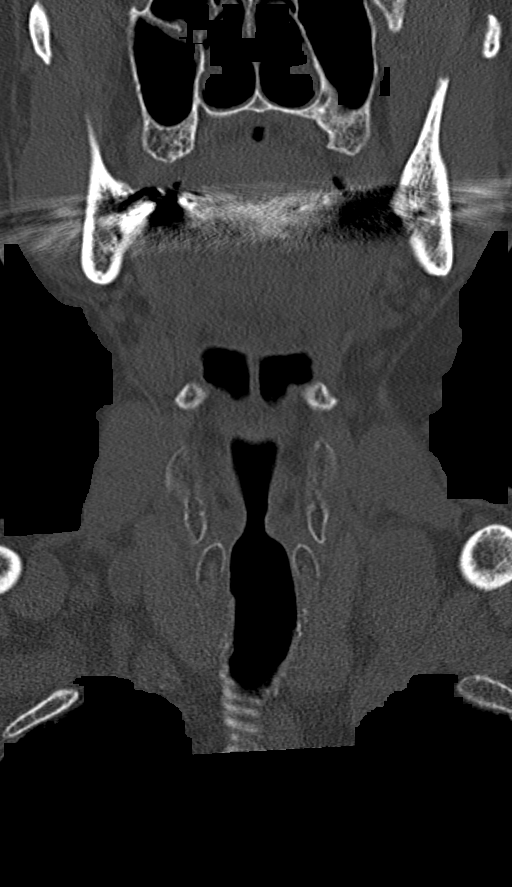
[im 35/84  bone]
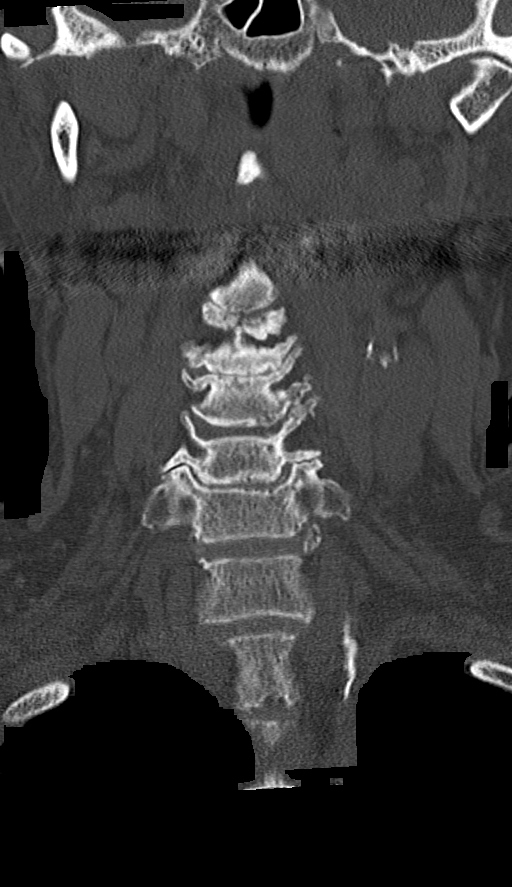
[im 49/84  bone]
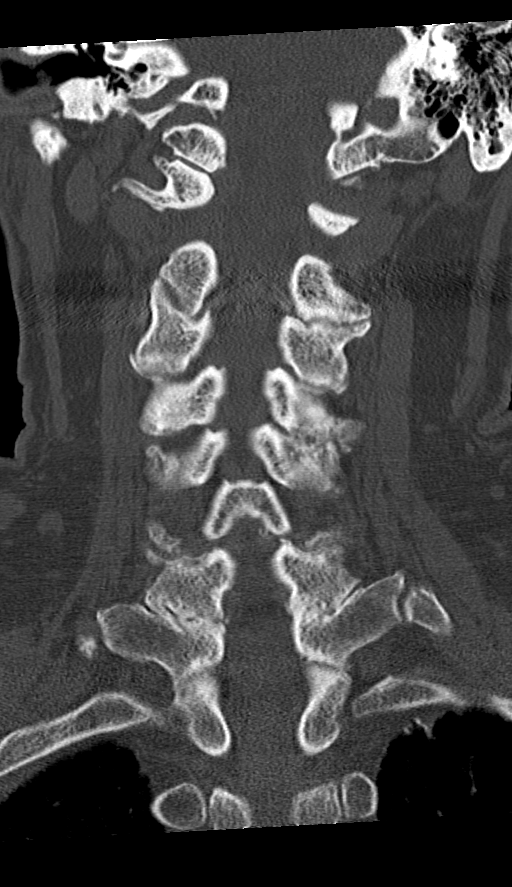

[10 of 33 positions shown; findings below may reference images not displayed]

FINDINGS: CT HEAD FINDINGS

Brain: There is no evidence of an acute infarct, intracranial
hemorrhage, mass, midline shift, or extra-axial fluid collection.
Generalized cerebral atrophy is mild for age. Hypodensities in the
cerebral white matter bilaterally are nonspecific but compatible
with mild chronic small vessel ischemic disease.

Vascular: Calcified atherosclerosis at the skull base.

Skull: No fracture or suspicious osseous lesion.

Sinuses/Orbits: Mild mucosal thickening in the right ethmoid and
right maxillary sinuses. No significant mastoid fluid. Bilateral
cataract extraction.

Other: None.

CT CERVICAL SPINE FINDINGS

Alignment: Straightening of the normal cervical lordosis. Trace
anterolisthesis of C6 on C7 and C7 on T1, likely degenerative and
facet mediated.

Skull base and vertebrae: No acute fracture or suspicious osseous
lesion.

Soft tissues and spinal canal: No prevertebral fluid or swelling. No
visible canal hematoma.

Disc levels: Advanced disc degeneration including severe disc space
narrowing and prominent degenerative endplate changes at C3-4, C4-5,
and C5-6 with at least moderate spinal stenosis at these levels.
Advanced cervical facet arthrosis, overall left worse than right.
Severe multilevel neural foraminal stenosis.

Upper chest: Clear lung apices.

Other: Mild-to-moderate calcific atherosclerosis at the left carotid
bifurcation.
IMPRESSION: 1. No evidence of acute intracranial abnormality.
2. No acute cervical spine fracture.
3. Advanced cervical disc and facet degeneration with multilevel
spinal and neural foraminal stenosis.

## 2022-09-13 DIAGNOSIS — H903 Sensorineural hearing loss, bilateral: Secondary | ICD-10-CM | POA: Diagnosis not present

## 2022-09-13 DIAGNOSIS — H6123 Impacted cerumen, bilateral: Secondary | ICD-10-CM | POA: Diagnosis not present

## 2022-09-30 DIAGNOSIS — J40 Bronchitis, not specified as acute or chronic: Secondary | ICD-10-CM | POA: Diagnosis not present

## 2022-09-30 DIAGNOSIS — R059 Cough, unspecified: Secondary | ICD-10-CM | POA: Diagnosis not present

## 2022-09-30 DIAGNOSIS — I5022 Chronic systolic (congestive) heart failure: Secondary | ICD-10-CM | POA: Diagnosis not present

## 2022-10-21 DIAGNOSIS — I5022 Chronic systolic (congestive) heart failure: Secondary | ICD-10-CM | POA: Diagnosis not present

## 2022-10-21 DIAGNOSIS — M48061 Spinal stenosis, lumbar region without neurogenic claudication: Secondary | ICD-10-CM | POA: Diagnosis not present

## 2022-10-21 DIAGNOSIS — I25119 Atherosclerotic heart disease of native coronary artery with unspecified angina pectoris: Secondary | ICD-10-CM | POA: Diagnosis not present

## 2022-10-21 DIAGNOSIS — I110 Hypertensive heart disease with heart failure: Secondary | ICD-10-CM | POA: Diagnosis not present

## 2022-10-21 DIAGNOSIS — R7303 Prediabetes: Secondary | ICD-10-CM | POA: Diagnosis not present

## 2022-10-21 DIAGNOSIS — I6523 Occlusion and stenosis of bilateral carotid arteries: Secondary | ICD-10-CM | POA: Diagnosis not present

## 2022-11-07 DIAGNOSIS — Z79899 Other long term (current) drug therapy: Secondary | ICD-10-CM | POA: Diagnosis not present

## 2022-11-07 DIAGNOSIS — M4316 Spondylolisthesis, lumbar region: Secondary | ICD-10-CM | POA: Diagnosis not present

## 2022-11-07 DIAGNOSIS — M48061 Spinal stenosis, lumbar region without neurogenic claudication: Secondary | ICD-10-CM | POA: Diagnosis not present

## 2022-11-07 DIAGNOSIS — M47816 Spondylosis without myelopathy or radiculopathy, lumbar region: Secondary | ICD-10-CM | POA: Diagnosis not present

## 2022-11-07 DIAGNOSIS — M5136 Other intervertebral disc degeneration, lumbar region: Secondary | ICD-10-CM | POA: Diagnosis not present

## 2022-11-07 DIAGNOSIS — M5416 Radiculopathy, lumbar region: Secondary | ICD-10-CM | POA: Diagnosis not present

## 2022-11-07 DIAGNOSIS — M8588 Other specified disorders of bone density and structure, other site: Secondary | ICD-10-CM | POA: Diagnosis not present

## 2022-12-01 DIAGNOSIS — I25118 Atherosclerotic heart disease of native coronary artery with other forms of angina pectoris: Secondary | ICD-10-CM | POA: Diagnosis not present

## 2022-12-01 DIAGNOSIS — I779 Disorder of arteries and arterioles, unspecified: Secondary | ICD-10-CM | POA: Diagnosis not present

## 2022-12-01 DIAGNOSIS — E782 Mixed hyperlipidemia: Secondary | ICD-10-CM | POA: Diagnosis not present

## 2022-12-01 DIAGNOSIS — I5022 Chronic systolic (congestive) heart failure: Secondary | ICD-10-CM | POA: Diagnosis not present

## 2022-12-01 DIAGNOSIS — I10 Essential (primary) hypertension: Secondary | ICD-10-CM | POA: Diagnosis not present

## 2022-12-01 DIAGNOSIS — I6523 Occlusion and stenosis of bilateral carotid arteries: Secondary | ICD-10-CM | POA: Diagnosis not present

## 2022-12-19 DIAGNOSIS — I5022 Chronic systolic (congestive) heart failure: Secondary | ICD-10-CM | POA: Diagnosis not present

## 2023-01-10 DIAGNOSIS — M170 Bilateral primary osteoarthritis of knee: Secondary | ICD-10-CM | POA: Diagnosis not present

## 2023-01-25 DIAGNOSIS — H353131 Nonexudative age-related macular degeneration, bilateral, early dry stage: Secondary | ICD-10-CM | POA: Diagnosis not present

## 2023-01-25 DIAGNOSIS — Z961 Presence of intraocular lens: Secondary | ICD-10-CM | POA: Diagnosis not present

## 2023-03-09 DIAGNOSIS — H6123 Impacted cerumen, bilateral: Secondary | ICD-10-CM | POA: Diagnosis not present

## 2023-03-09 DIAGNOSIS — H903 Sensorineural hearing loss, bilateral: Secondary | ICD-10-CM | POA: Diagnosis not present

## 2023-03-21 ENCOUNTER — Ambulatory Visit: Payer: Medicare HMO | Admitting: Physician Assistant

## 2023-03-21 ENCOUNTER — Encounter: Payer: Self-pay | Admitting: Physician Assistant

## 2023-03-21 VITALS — BP 109/56 | HR 79 | Ht 70.0 in | Wt 190.0 lb

## 2023-03-21 DIAGNOSIS — R339 Retention of urine, unspecified: Secondary | ICD-10-CM

## 2023-03-21 LAB — BLADDER SCAN AMB NON-IMAGING: Scan Result: 82

## 2023-03-21 MED ORDER — SILODOSIN 4 MG PO CAPS: 4.0000 mg | ORAL_CAPSULE | Freq: Every day | ORAL | 3 refills | Status: DC

## 2023-03-21 NOTE — Progress Notes (Signed)
03/21/2023 10:46 AM   Travis Schultz 12/16/1925 [id number]  CC: Chief Complaint  Patient presents with   Follow-up   HPI: Travis Schultz is a 89 y.o. male with PMH prostate cancer s/p radical prostatectomy at [facility] in 1993 and bothersome LUTS including urinary incontinence and nocturia on Rapaflo who presents today for annual follow-up.  He is accompanied today by his friend, Dennie Bible, who contributes to HPI.  Today they report that he has been having more memory issues within the past year.  His urinary symptoms seem well-controlled, but he is really been struggling with constipation and associated lower abdominal discomfort that is relieved with BMs.  He has been seeing Dr. Dareen Piano for this.  He denies gross hematuria or dysuria.  He admits to some straining, but this is associated with constipation.  PVR 82 mL.  PMH: Past Medical History:  Diagnosis Date   Actinic keratosis    Lymphoma (HCC) 1991   Myocardial infarction (HCC) 06/17/2009   5 bypass's    Surgical History: Past Surgical History:  Procedure Laterality Date   BACK SURGERY     after injury to back    CARDIAC SURGERY     CAROTID ARTERY ANGIOPLASTY N/A 02/24/2010   CATARACT EXTRACTION Bilateral 2012   EYE SURGERY Bilateral 2012   PROSTATE SURGERY     ROTATOR CUFF REPAIR Right 2008    Home Medications:  Allergies as of 03/21/2023       Reactions   Phenergan [promethazine Hcl]    Altered mental status   Promethazine Hcl         Medication List        Accurate as of March 21, 2023 10:46 AM. If you have any questions, ask your nurse or doctor.          acetaminophen 325 MG tablet Commonly known as: TYLENOL Take 325 mg by mouth at bedtime.   aspirin 81 MG tablet Take 81 mg by mouth at bedtime.   docusate sodium 100 MG capsule Commonly known as: COLACE Take 1 capsule by mouth as needed.   ICaps Areds 2 Caps Take 1 capsule by mouth 2 (two) times daily.   isosorbide mononitrate 30 MG 24  hr tablet Commonly known as: IMDUR Take 30 mg by mouth daily.   naproxen sodium 220 MG tablet Commonly known as: ALEVE Take 220 mg by mouth daily.   pantoprazole 40 MG tablet Commonly known as: PROTONIX Take 1 tablet (40 mg total) by mouth 2 (two) times daily.   polyethylene glycol 17 g packet Commonly known as: MIRALAX / GLYCOLAX Take 17 g by mouth daily.   pravastatin 40 MG tablet Commonly known as: PRAVACHOL Take 40 mg by mouth at bedtime.   pregabalin 50 MG capsule Commonly known as: LYRICA Take 50 mg by mouth 2 (two) times daily.   silodosin 4 MG Caps capsule Commonly known as: RAPAFLO Take 1 capsule (4 mg total) by mouth daily with breakfast.        Allergies:  Allergies  Allergen Reactions   Phenergan [Promethazine Hcl]     Altered mental status   Promethazine Hcl     Family History: Family History  Problem Relation Age of Onset   Heart disease Father    Heart disease Brother     Social History:   reports that he has never smoked. He has never used smokeless tobacco. He reports that he does not drink alcohol and does not use drugs.  Physical Exam: BP Marland Kitchen)  109/56   Pulse 79   Ht 5\' 10"  (1.778 m)   Wt 190 lb (86.2 kg)   BMI 27.26 kg/m   Constitutional:  Alert, no acute distress, nontoxic appearing HEENT: [place], AT Cardiovascular: No clubbing, cyanosis, or edema Respiratory: Normal respiratory effort, no increased work of breathing Skin: No rashes, bruises or suspicious lesions Neurologic: Grossly intact, no focal deficits, moving all 4 extremities Psychiatric: Normal mood and affect  Laboratory Data: Results for orders placed or performed in visit on 03/21/23  Bladder Scan (Post Void Residual) in office  Result Value Ref Range   Scan Result 82    Assessment & Plan:   1. Incomplete bladder emptying Well-managed on Rapaflo, will continue this.  He is having some occasional straining, but this sounds secondary to constipation.  Agree with bowel  regimen per Dr. Dareen Piano. - Bladder Scan (Post Void Residual) in office - silodosin (RAPAFLO) 4 MG CAPS capsule; Take 1 capsule (4 mg total) by mouth daily with breakfast.  Dispense: 90 capsule; Refill: 3  Return in about 1 year (around 03/20/2024) for Annual follow-up with PVR.  Carman Ching, PA-C  [facility] [place] [address], [address] [place], [place] [address] [phone]

## 2023-04-24 DIAGNOSIS — R7303 Prediabetes: Secondary | ICD-10-CM | POA: Diagnosis not present

## 2023-04-24 DIAGNOSIS — I5022 Chronic systolic (congestive) heart failure: Secondary | ICD-10-CM | POA: Diagnosis not present

## 2023-04-24 DIAGNOSIS — I6523 Occlusion and stenosis of bilateral carotid arteries: Secondary | ICD-10-CM | POA: Diagnosis not present

## 2023-04-24 DIAGNOSIS — I110 Hypertensive heart disease with heart failure: Secondary | ICD-10-CM | POA: Diagnosis not present

## 2023-04-24 DIAGNOSIS — I25119 Atherosclerotic heart disease of native coronary artery with unspecified angina pectoris: Secondary | ICD-10-CM | POA: Diagnosis not present

## 2023-04-24 DIAGNOSIS — Z1331 Encounter for screening for depression: Secondary | ICD-10-CM | POA: Diagnosis not present

## 2023-04-24 DIAGNOSIS — E782 Mixed hyperlipidemia: Secondary | ICD-10-CM | POA: Diagnosis not present

## 2023-04-24 DIAGNOSIS — Z0000 Encounter for general adult medical examination without abnormal findings: Secondary | ICD-10-CM | POA: Diagnosis not present

## 2023-06-12 DIAGNOSIS — E782 Mixed hyperlipidemia: Secondary | ICD-10-CM | POA: Diagnosis not present

## 2023-06-12 DIAGNOSIS — I5022 Chronic systolic (congestive) heart failure: Secondary | ICD-10-CM | POA: Diagnosis not present

## 2023-06-12 DIAGNOSIS — I6523 Occlusion and stenosis of bilateral carotid arteries: Secondary | ICD-10-CM | POA: Diagnosis not present

## 2023-06-12 DIAGNOSIS — I10 Essential (primary) hypertension: Secondary | ICD-10-CM | POA: Diagnosis not present

## 2023-06-12 DIAGNOSIS — I25118 Atherosclerotic heart disease of native coronary artery with other forms of angina pectoris: Secondary | ICD-10-CM | POA: Diagnosis not present

## 2023-06-12 DIAGNOSIS — R079 Chest pain, unspecified: Secondary | ICD-10-CM | POA: Diagnosis not present

## 2023-06-21 DIAGNOSIS — I5022 Chronic systolic (congestive) heart failure: Secondary | ICD-10-CM | POA: Diagnosis not present

## 2023-06-21 DIAGNOSIS — I25118 Atherosclerotic heart disease of native coronary artery with other forms of angina pectoris: Secondary | ICD-10-CM | POA: Diagnosis not present

## 2023-06-21 DIAGNOSIS — R079 Chest pain, unspecified: Secondary | ICD-10-CM | POA: Diagnosis not present

## 2023-07-25 DIAGNOSIS — H353131 Nonexudative age-related macular degeneration, bilateral, early dry stage: Secondary | ICD-10-CM | POA: Diagnosis not present

## 2023-07-25 DIAGNOSIS — Z961 Presence of intraocular lens: Secondary | ICD-10-CM | POA: Diagnosis not present

## 2023-10-26 DIAGNOSIS — I6523 Occlusion and stenosis of bilateral carotid arteries: Secondary | ICD-10-CM | POA: Diagnosis not present

## 2023-10-26 DIAGNOSIS — I25118 Atherosclerotic heart disease of native coronary artery with other forms of angina pectoris: Secondary | ICD-10-CM | POA: Diagnosis not present

## 2023-10-26 DIAGNOSIS — I110 Hypertensive heart disease with heart failure: Secondary | ICD-10-CM | POA: Diagnosis not present

## 2023-10-26 DIAGNOSIS — R7303 Prediabetes: Secondary | ICD-10-CM | POA: Diagnosis not present

## 2023-10-26 DIAGNOSIS — I5022 Chronic systolic (congestive) heart failure: Secondary | ICD-10-CM | POA: Diagnosis not present

## 2023-12-06 ENCOUNTER — Encounter: Payer: Self-pay | Admitting: Emergency Medicine

## 2023-12-06 ENCOUNTER — Observation Stay
Admission: EM | Admit: 2023-12-06 | Discharge: 2023-12-08 | Disposition: A | Discharge status: Home / Self Care | Attending: Student | Admitting: Student

## 2023-12-06 ENCOUNTER — Other Ambulatory Visit: Payer: Self-pay

## 2023-12-06 DIAGNOSIS — Z7982 Long term (current) use of aspirin: Secondary | ICD-10-CM | POA: Insufficient documentation

## 2023-12-06 DIAGNOSIS — I2510 Atherosclerotic heart disease of native coronary artery without angina pectoris: Secondary | ICD-10-CM | POA: Diagnosis not present

## 2023-12-06 DIAGNOSIS — R2689 Other abnormalities of gait and mobility: Secondary | ICD-10-CM | POA: Insufficient documentation

## 2023-12-06 DIAGNOSIS — I10 Essential (primary) hypertension: Secondary | ICD-10-CM | POA: Diagnosis not present

## 2023-12-06 DIAGNOSIS — I4510 Unspecified right bundle-branch block: Secondary | ICD-10-CM | POA: Diagnosis not present

## 2023-12-06 DIAGNOSIS — Z79899 Other long term (current) drug therapy: Secondary | ICD-10-CM | POA: Diagnosis not present

## 2023-12-06 DIAGNOSIS — R8281 Pyuria: Secondary | ICD-10-CM | POA: Diagnosis not present

## 2023-12-06 DIAGNOSIS — R1111 Vomiting without nausea: Secondary | ICD-10-CM | POA: Diagnosis not present

## 2023-12-06 DIAGNOSIS — R531 Weakness: Secondary | ICD-10-CM | POA: Diagnosis not present

## 2023-12-06 DIAGNOSIS — D509 Iron deficiency anemia, unspecified: Secondary | ICD-10-CM | POA: Diagnosis not present

## 2023-12-06 DIAGNOSIS — Z1152 Encounter for screening for COVID-19: Secondary | ICD-10-CM | POA: Diagnosis not present

## 2023-12-06 DIAGNOSIS — K219 Gastro-esophageal reflux disease without esophagitis: Secondary | ICD-10-CM | POA: Insufficient documentation

## 2023-12-06 DIAGNOSIS — Z951 Presence of aortocoronary bypass graft: Secondary | ICD-10-CM | POA: Diagnosis not present

## 2023-12-06 DIAGNOSIS — N390 Urinary tract infection, site not specified: Secondary | ICD-10-CM | POA: Diagnosis not present

## 2023-12-06 DIAGNOSIS — R231 Pallor: Secondary | ICD-10-CM | POA: Diagnosis not present

## 2023-12-06 DIAGNOSIS — N400 Enlarged prostate without lower urinary tract symptoms: Secondary | ICD-10-CM | POA: Diagnosis not present

## 2023-12-06 DIAGNOSIS — R110 Nausea: Secondary | ICD-10-CM | POA: Diagnosis not present

## 2023-12-06 LAB — URINALYSIS, ROUTINE W REFLEX MICROSCOPIC
Bacteria, UA: NONE SEEN
Bilirubin Urine: NEGATIVE
Glucose, UA: NEGATIVE mg/dL
Hgb urine dipstick: NEGATIVE
Ketones, ur: 5 mg/dL — AB
Nitrite: NEGATIVE
Protein, ur: NEGATIVE mg/dL
Specific Gravity, Urine: 1.019 (ref 1.005–1.030)
Squamous Epithelial / HPF: 0 /HPF (ref 0–5)
pH: 5 (ref 5.0–8.0)

## 2023-12-06 LAB — CBC
HCT: 33.1 % — ABNORMAL LOW (ref 39.0–52.0)
Hemoglobin: 11.6 g/dL — ABNORMAL LOW (ref 13.0–17.0)
MCH: 32.8 pg (ref 26.0–34.0)
MCHC: 35 g/dL (ref 30.0–36.0)
MCV: 93.5 fL (ref 80.0–100.0)
Platelets: 158 10*3/uL (ref 150–400)
RBC: 3.54 MIL/uL — ABNORMAL LOW (ref 4.22–5.81)
RDW: 12.5 % (ref 11.5–15.5)
WBC: 13.2 10*3/uL — ABNORMAL HIGH (ref 4.0–10.5)
nRBC: 0 % (ref 0.0–0.2)

## 2023-12-06 LAB — COMPREHENSIVE METABOLIC PANEL
ALT: 20 U/L (ref 0–44)
AST: 25 U/L (ref 15–41)
Albumin: 4.2 g/dL (ref 3.5–5.0)
Alkaline Phosphatase: 67 U/L (ref 38–126)
Anion gap: 9 (ref 5–15)
BUN: 21 mg/dL (ref 8–23)
CO2: 24 mmol/L (ref 22–32)
Calcium: 8.6 mg/dL — ABNORMAL LOW (ref 8.9–10.3)
Chloride: 103 mmol/L (ref 98–111)
Creatinine, Ser: 1.02 mg/dL (ref 0.61–1.24)
GFR, Estimated: >60 mL/min (ref >60)
Glucose, Bld: 123 mg/dL — ABNORMAL HIGH (ref 70–99)
Potassium: 4.1 mmol/L (ref 3.5–5.1)
Sodium: 136 mmol/L (ref 135–145)
Total Bilirubin: 1.9 mg/dL — ABNORMAL HIGH (ref 0.0–1.2)
Total Protein: 6.6 g/dL (ref 6.5–8.1)

## 2023-12-06 LAB — TROPONIN I (HIGH SENSITIVITY)
Troponin I (High Sensitivity): 11 ng/L (ref <18)
Troponin I (High Sensitivity): 13 ng/L (ref <18)

## 2023-12-06 LAB — LIPASE, BLOOD: Lipase: 18 U/L (ref 11–51)

## 2023-12-06 LAB — RESP PANEL BY RT-PCR (RSV, FLU A&B, COVID)  RVPGX2
Influenza A by PCR: NEGATIVE
Influenza B by PCR: NEGATIVE
Resp Syncytial Virus by PCR: NEGATIVE
SARS Coronavirus 2 by RT PCR: NEGATIVE

## 2023-12-06 MED ORDER — CEPHALEXIN 500 MG PO CAPS: 500.0000 mg | ORAL_CAPSULE | Freq: Three times a day (TID) | ORAL | 0 refills | Status: DC

## 2023-12-06 MED ORDER — CEFTRIAXONE SODIUM 1 G IJ SOLR
1.0000 g | Freq: Once | INTRAMUSCULAR | Status: AC
  Administered 2023-12-06: 1 g via INTRAVENOUS
  Filled 2023-12-06: qty 10

## 2023-12-06 MED ORDER — SODIUM CHLORIDE 0.9 % IV BOLUS
500.0000 mL | Freq: Once | INTRAVENOUS | Status: AC
Start: 2023-12-06 — End: 2023-12-07
  Administered 2023-12-06: 500 mL via INTRAVENOUS

## 2023-12-06 NOTE — ED Provider Notes (Signed)
 [facility] Provider Note    Event Date/Time   First MD Initiated Contact with Patient 12/06/23 2102     (approximate)   History   Weakness and Fall   HPI  Travis Schultz is a 89 y.o. male who presents to the emergency department today because of concerns for weakness and fall.  Patient states that he was lying in bed when he became weak and slid out of bed.  Did have some tremors in his arms when this happened.  He then was unable to get himself off of the ground.  He did have an episode of the shaking a few days ago although that resolved in about 10 minutes.  The patient denied any chest pain or palpitations while this was going on.     Physical Exam   Triage Vital Signs: ED Triage Vitals  Encounter Vitals Group     BP 12/06/23 2100 (!) 147/69     Systolic BP Percentile --      Diastolic BP Percentile --      Pulse Rate 12/06/23 2100 91     Resp 12/06/23 2100 15     Temp 12/06/23 2100 98.7 F (37.1 C)     Temp Source 12/06/23 2100 Oral     SpO2 12/06/23 2100 95 %     Weight 12/06/23 2109 190 lb (86.2 kg)     Height 12/06/23 2109 6' (1.829 m)     Head Circumference --      Peak Flow --      Pain Score 12/06/23 2108 3     Pain Loc --      Pain Education --      Exclude from Growth Chart --     Most recent vital signs: Vitals:   12/06/23 2100  BP: (!) 147/69  Pulse: 91  Resp: 15  Temp: 98.7 F (37.1 C)  SpO2: 95%   General: Awake, alert, oriented. CV:  Good peripheral perfusion. Regular rate and rhythm. Resp:  Normal effort. Lungs clear. Abd:  No distention.    ED Results / Procedures / Treatments   Labs (all labs ordered are listed, but only abnormal results are displayed) Labs Reviewed  COMPREHENSIVE METABOLIC PANEL - Abnormal; Notable for the following components:      Result Value   Glucose, Bld 123 (*)    Calcium 8.6 (*)    Total Bilirubin 1.9 (*)    All other components within normal limits  CBC - Abnormal;  Notable for the following components:   WBC 13.2 (*)    RBC 3.54 (*)    Hemoglobin 11.6 (*)    HCT 33.1 (*)    All other components within normal limits  URINALYSIS, ROUTINE W REFLEX MICROSCOPIC - Abnormal; Notable for the following components:   Color, Urine YELLOW (*)    APPearance HAZY (*)    Ketones, ur 5 (*)    Leukocytes,Ua TRACE (*)    All other components within normal limits  RESP PANEL BY RT-PCR (RSV, FLU A&B, COVID)  [id number]  URINE CULTURE  LIPASE, BLOOD  TROPONIN I (HIGH SENSITIVITY)     EKG  I, Phineas Semen, attending physician, personally viewed and interpreted this EKG  EKG Time: 2145 Rate: 99 Rhythm: sinus rhythm Axis: left axis deviation Intervals: qtc 474 QRS: RBBB ST changes: no st elevation Impression: abnormal ekg   RADIOLOGY None  PROCEDURES:  Critical Care performed: No  MEDICATIONS ORDERED IN ED: Medications - No data  to display   IMPRESSION / MDM / ASSESSMENT AND PLAN / ED COURSE  I reviewed the triage vital signs and the nursing notes.                              Differential diagnosis includes, but is not limited to, anemia, electrolyte abnormality, dehydration, infection  Patient's presentation is most consistent with acute presentation with potential threat to life or bodily function.  Patient presents to the emergency department today because of concern for weakness. On exam patient is awake and alert. EKG without concerning abnormality. No anemia or concerning electrolyte abnormality. UA however is concerning for UTI. I discussed this with the patient and fiance. Will start IV abx. Offered admission however at this time patient would like to go home. Did discuss we will trial ambulation after IVFs and abx and if he feels comfortable with ambulation we could discharge. In addition will add on troponin to blood test out of abundance of caution.     FINAL CLINICAL IMPRESSION(S) / ED DIAGNOSES   Final diagnoses:  None      Rx / DC Orders   ED Discharge Orders     None        Note:  This document was prepared using Dragon voice recognition software and may include unintentional dictation errors.    Phineas Semen, MD 12/06/23 [phone]

## 2023-12-06 NOTE — ED Triage Notes (Signed)
 Patient came in by ems fall at home,  "slid out of bed" ems bgl 122, diarrhea for a day.  Increased weakness, significant cardiac  history.

## 2023-12-07 ENCOUNTER — Encounter: Payer: Self-pay | Admitting: Family Medicine

## 2023-12-07 DIAGNOSIS — R531 Weakness: Principal | ICD-10-CM

## 2023-12-07 LAB — CBC
HCT: 31.2 % — ABNORMAL LOW (ref 39.0–52.0)
Hemoglobin: 11 g/dL — ABNORMAL LOW (ref 13.0–17.0)
MCH: 32.4 pg (ref 26.0–34.0)
MCHC: 35.3 g/dL (ref 30.0–36.0)
MCV: 91.8 fL (ref 80.0–100.0)
Platelets: 142 10*3/uL — ABNORMAL LOW (ref 150–400)
RBC: 3.4 MIL/uL — ABNORMAL LOW (ref 4.22–5.81)
RDW: 12.8 % (ref 11.5–15.5)
WBC: 13.1 10*3/uL — ABNORMAL HIGH (ref 4.0–10.5)
nRBC: 0 % (ref 0.0–0.2)

## 2023-12-07 LAB — BASIC METABOLIC PANEL
Anion gap: 8 (ref 5–15)
BUN: 19 mg/dL (ref 8–23)
CO2: 25 mmol/L (ref 22–32)
Calcium: 8.2 mg/dL — ABNORMAL LOW (ref 8.9–10.3)
Chloride: 105 mmol/L (ref 98–111)
Creatinine, Ser: 0.96 mg/dL (ref 0.61–1.24)
GFR, Estimated: >60 mL/min (ref >60)
Glucose, Bld: 116 mg/dL — ABNORMAL HIGH (ref 70–99)
Potassium: 3.8 mmol/L (ref 3.5–5.1)
Sodium: 138 mmol/L (ref 135–145)

## 2023-12-07 LAB — VITAMIN B12: Vitamin B-12: 330 pg/mL (ref 180–914)

## 2023-12-07 LAB — PHOSPHORUS: Phosphorus: 2.9 mg/dL (ref 2.5–4.6)

## 2023-12-07 LAB — IRON AND TIBC
Iron: 29 ug/dL — ABNORMAL LOW (ref 45–182)
Saturation Ratios: 9 % — ABNORMAL LOW (ref 17.9–39.5)
TIBC: 316 ug/dL (ref 250–450)
UIBC: 287 ug/dL

## 2023-12-07 LAB — MAGNESIUM: Magnesium: 2.1 mg/dL (ref 1.7–2.4)

## 2023-12-07 LAB — CK: Total CK: 58 U/L (ref 49–397)

## 2023-12-07 LAB — VITAMIN D 25 HYDROXY (VIT D DEFICIENCY, FRACTURES): Vit D, 25-Hydroxy: 32.96 ng/mL (ref 30–100)

## 2023-12-07 LAB — FOLATE: Folate: 25 ng/mL (ref >5.9)

## 2023-12-07 MED ORDER — PROSIGHT PO TABS
1.0000 | ORAL_TABLET | Freq: Two times a day (BID) | ORAL | Status: DC
  Administered 2023-12-07 – 2023-12-08 (×3): 1 via ORAL
  Filled 2023-12-07 (×3): qty 1

## 2023-12-07 MED ORDER — TRAZODONE HCL 50 MG PO TABS
25.0000 mg | ORAL_TABLET | Freq: Every evening | ORAL | Status: DC | PRN
  Administered 2023-12-07: 25 mg via ORAL
  Filled 2023-12-07: qty 1

## 2023-12-07 MED ORDER — OCUVITE-LUTEIN PO CAPS
1.0000 | ORAL_CAPSULE | Freq: Two times a day (BID) | ORAL | Status: DC
Start: 2023-12-07 — End: 2023-12-07
  Filled 2023-12-07 (×2): qty 1

## 2023-12-07 MED ORDER — POLYETHYLENE GLYCOL 3350 17 G PO PACK
17.0000 g | PACK | Freq: Two times a day (BID) | ORAL | Status: DC
  Administered 2023-12-08: 17 g via ORAL
  Filled 2023-12-07 (×2): qty 1

## 2023-12-07 MED ORDER — PREGABALIN 50 MG PO CAPS
50.0000 mg | ORAL_CAPSULE | Freq: Two times a day (BID) | ORAL | Status: DC
  Administered 2023-12-07 – 2023-12-08 (×3): 50 mg via ORAL
  Filled 2023-12-07 (×3): qty 1

## 2023-12-07 MED ORDER — ONDANSETRON HCL 4 MG PO TABS: 4.0000 mg | ORAL_TABLET | Freq: Four times a day (QID) | ORAL | Status: DC | PRN

## 2023-12-07 MED ORDER — CYANOCOBALAMIN 1000 MCG/ML IJ SOLN
1000.0000 ug | Freq: Every day | INTRAMUSCULAR | Status: AC
  Administered 2023-12-07 – 2023-12-08 (×2): 1000 ug via INTRAMUSCULAR
  Filled 2023-12-07 (×2): qty 1

## 2023-12-07 MED ORDER — SODIUM CHLORIDE 0.9 % IV SOLN
1.0000 g | INTRAVENOUS | Status: DC
  Administered 2023-12-07: 1 g via INTRAVENOUS
  Filled 2023-12-07: qty 10

## 2023-12-07 MED ORDER — VITAMIN B-12 1000 MCG PO TABS: 1000.0000 ug | ORAL_TABLET | Freq: Every day | ORAL | Status: DC

## 2023-12-07 MED ORDER — MAGNESIUM HYDROXIDE 400 MG/5ML PO SUSP: 30.0000 mL | Freq: Every day | ORAL | Status: DC | PRN

## 2023-12-07 MED ORDER — TAMSULOSIN HCL 0.4 MG PO CAPS
0.4000 mg | ORAL_CAPSULE | Freq: Every day | ORAL | Status: DC
  Administered 2023-12-07: 0.4 mg via ORAL
  Filled 2023-12-07: qty 1

## 2023-12-07 MED ORDER — ENOXAPARIN SODIUM 40 MG/0.4ML IJ SOSY
40.0000 mg | PREFILLED_SYRINGE | INTRAMUSCULAR | Status: DC
  Administered 2023-12-07 – 2023-12-08 (×2): 40 mg via SUBCUTANEOUS
  Filled 2023-12-07 (×2): qty 0.4

## 2023-12-07 MED ORDER — ASPIRIN 81 MG PO TBEC
81.0000 mg | DELAYED_RELEASE_TABLET | Freq: Every day | ORAL | Status: DC
  Administered 2023-12-07: 81 mg via ORAL
  Filled 2023-12-07: qty 1

## 2023-12-07 MED ORDER — ONDANSETRON HCL 4 MG/2ML IJ SOLN: 4.0000 mg | Freq: Four times a day (QID) | INTRAMUSCULAR | Status: DC | PRN

## 2023-12-07 MED ORDER — IRON SUCROSE 300 MG IVPB - SIMPLE MED
300.0000 mg | Freq: Every day | Status: DC
  Filled 2023-12-07: qty 265

## 2023-12-07 MED ORDER — PANTOPRAZOLE SODIUM 40 MG PO TBEC
40.0000 mg | DELAYED_RELEASE_TABLET | Freq: Two times a day (BID) | ORAL | Status: DC
  Administered 2023-12-07 – 2023-12-08 (×3): 40 mg via ORAL
  Filled 2023-12-07 (×3): qty 1

## 2023-12-07 MED ORDER — IRON SUCROSE 300 MG IVPB - SIMPLE MED
300.0000 mg | Freq: Every day | Status: AC
  Administered 2023-12-07 – 2023-12-08 (×2): 300 mg via INTRAVENOUS
  Filled 2023-12-07 (×2): qty 300

## 2023-12-07 MED ORDER — ACETAMINOPHEN 325 MG PO TABS: 650.0000 mg | ORAL_TABLET | Freq: Four times a day (QID) | ORAL | Status: DC | PRN

## 2023-12-07 MED ORDER — ISOSORBIDE MONONITRATE ER 30 MG PO TB24
30.0000 mg | ORAL_TABLET | Freq: Every day | ORAL | Status: DC
  Administered 2023-12-07 – 2023-12-08 (×2): 30 mg via ORAL
  Filled 2023-12-07 (×2): qty 1

## 2023-12-07 MED ORDER — VITAMIN D (ERGOCALCIFEROL) 1.25 MG (50000 UNIT) PO CAPS
50000.0000 [IU] | ORAL_CAPSULE | ORAL | Status: DC
  Administered 2023-12-08: 50000 [IU] via ORAL
  Filled 2023-12-07: qty 1

## 2023-12-07 MED ORDER — ACETAMINOPHEN 650 MG RE SUPP: 650.0000 mg | Freq: Four times a day (QID) | RECTAL | Status: DC | PRN

## 2023-12-07 MED ORDER — SODIUM CHLORIDE 0.9 % IV SOLN: INTRAVENOUS | Status: AC

## 2023-12-07 NOTE — ED Notes (Signed)
 This RN ambulated patient at this time. Patient was able to ambulate to the toilet with a person assist. The patient was a little unsteady on his feet during the ambulation trial. This RN notified Dolores Frame, MD.

## 2023-12-07 NOTE — Plan of Care (Signed)

## 2023-12-07 NOTE — H&P (Signed)
 [facility] History and Physical   Patient: Travis Schultz HYQ:[id number]   PCP: Lauro Regulus, MD DOB: 1926-02-05   DOA: 12/06/2023   DOS: 12/07/2023   DOS: the patient was seen and examined on 12/06/2023  Patient coming from: The patient is coming from Home  Chief Complaint: Generalized weakness  HPI: Travis Schultz is a 89 y.o. male with Past medical history of CAD status post CABG, lymphoma, lumbar degenerative joint disease, as reviewed from EMR, presented at [facility] ED with generalized weakness and fall.  As per patient was feeling weak in the bed and he slid out of the bed, he was able to get up himself from the ground.  Patient was also unable to get out of the toilet, feels generalized weakness.  UA mildly positive, patient also had tremors in his hand before the fall and also had tremor in the hand off and on, resolves itself, not constant.  Denied any shortness of breath, no chest pain or palpitation, no any other complaints.  Denied any fever or chills, no dysuria   ED Course: VS afebrile, HR 91, RR 15, BP 147/69, 95% on RA BMP blood glucose 123, calcium 8.6, TB 1.9, rest within normal range. CBC WBC 13.2, Hb 11.6 UA LE trace, nitrate negative, bacteria none, WBC 21-50, RBC 6-10 Urine culture collected  Patient was consulted for admission and further management as below  Review of Systems: as mentioned in the history of present illness.  All other systems reviewed and are negative.  Past Medical History:  Diagnosis Date   Actinic keratosis    Lymphoma (HCC) 1991   Myocardial infarction (HCC) 06/17/2009   5 bypass's   Past Surgical History:  Procedure Laterality Date   BACK SURGERY     after injury to back    CARDIAC SURGERY     CAROTID ARTERY ANGIOPLASTY N/A 02/24/2010   CATARACT EXTRACTION Bilateral 2012   EYE SURGERY Bilateral 2012   PROSTATE SURGERY     ROTATOR CUFF REPAIR Right 2008   Social History:  reports that he has never smoked. He has never  used smokeless tobacco. He reports that he does not drink alcohol and does not use drugs.  Allergies  Allergen Reactions   Phenergan [Promethazine Hcl]     Altered mental status   Promethazine Hcl      Family history reviewed and not pertinent Family History  Problem Relation Age of Onset   Heart disease Father    Heart disease Brother      Prior to Admission medications   Medication Sig Start Date End Date Taking? Authorizing Provider  acetaminophen (TYLENOL) 325 MG tablet Take 325 mg by mouth at bedtime.   Yes [provider]  aspirin 81 MG tablet Take 81 mg by mouth at bedtime.   Yes [provider]  cephALEXin (KEFLEX) 500 MG capsule Take 1 capsule (500 mg total) by mouth 3 (three) times daily. 12/06/23  Yes Phineas Semen, MD  docusate sodium (COLACE) 100 MG capsule Take 1 capsule by mouth as needed.   Yes [provider]  isosorbide mononitrate (IMDUR) 30 MG 24 hr tablet Take 30 mg by mouth daily. 11/26/21  Yes [provider]  Multiple Vitamins-Minerals (ICAPS AREDS 2) CAPS Take 1 capsule by mouth 2 (two) times daily.   Yes [provider]  naproxen sodium (ALEVE) 220 MG tablet Take 220 mg by mouth daily.   Yes [provider]  pantoprazole (PROTONIX) 40 MG tablet Take  1 tablet (40 mg total) by mouth 2 (two) times daily. 01/06/21  Yes Arnetha Courser, MD  polyethylene glycol (MIRALAX / GLYCOLAX) 17 g packet Take 17 g by mouth daily. 01/06/21  Yes Arnetha Courser, MD  pravastatin (PRAVACHOL) 40 MG tablet Take 40 mg by mouth at bedtime. 12/11/17  Yes [provider]  pregabalin (LYRICA) 50 MG capsule Take 50 mg by mouth 2 (two) times daily. 11/12/21  Yes [provider]  silodosin (RAPAFLO) 4 MG CAPS capsule Take 1 capsule (4 mg total) by mouth daily with breakfast. 03/21/23  Yes Carman Ching, PA-C    Physical Exam: Vitals:   12/07/23 0030 12/07/23 0130 12/07/23 0219 12/07/23 0754  BP: (!) 109/56 122/63  (!) 156/58 (!) 151/65  Pulse: 88 89 81 74  Resp: 16 16 18 17   Temp: 98.7 F (37.1 C)  99.1 F (37.3 C) 97.7 F (36.5 C)  TempSrc: Oral     SpO2: 95%  99% 100%  Weight:      Height:        General: alert and oriented to time, place, and person. Appear in no distress, affect appropriate Eyes: PERRLA, Conjunctiva normal ENT: Oral Mucosa Clear, moist  Neck: no JVD, no Abnormal Mass Or lumps Cardiovascular: S1 and S2 Present, no Murmur, peripheral pulses symmetrical Respiratory: good respiratory effort, Bilateral Air entry equal and Decreased, no signs of accessory muscle use, Clear to Auscultation, no Crackles, no wheezes Abdomen: Bowel Sound present, Soft and no tenderness, no hernia Skin: no rashes  Extremities: no Pedal edema, no calf tenderness Neurologic: without any new focal findings Gait not checked due to patient safety concerns  Data Reviewed: I have personally reviewed and interpreted labs, imaging as discussed below.  CBC: Recent Labs  Lab 12/06/23 2119  WBC 13.2*  HGB 11.6*  HCT 33.1*  MCV 93.5  PLT 158   Basic Metabolic Panel: Recent Labs  Lab 12/06/23 2119  NA 136  K 4.1  CL 103  CO2 24  GLUCOSE 123*  BUN 21  CREATININE 1.02  CALCIUM 8.6*   GFR: Estimated Creatinine Clearance: 47.5 mL/min (by C-G formula based on SCr of 1.02 mg/dL). Liver Function Tests: Recent Labs  Lab 12/06/23 2119  AST 25  ALT 20  ALKPHOS 67  BILITOT 1.9*  PROT 6.6  ALBUMIN 4.2   Recent Labs  Lab 12/06/23 2119  LIPASE 18   No results for input(s): "AMMONIA" in the last 168 hours. Coagulation Profile: No results for input(s): "INR", "PROTIME" in the last 168 hours. Cardiac Enzymes: No results for input(s): "CKTOTAL", "CKMB", "CKMBINDEX", "TROPONINI" in the last 168 hours. BNP (last 3 results) No results for input(s): "PROBNP" in the last 8760 hours. HbA1C: No results for input(s): "HGBA1C" in the last 72 hours. CBG: No results for input(s): "GLUCAP" in the  last 168 hours. Lipid Profile: No results for input(s): "CHOL", "HDL", "LDLCALC", "TRIG", "CHOLHDL", "LDLDIRECT" in the last 72 hours. Thyroid Function Tests: No results for input(s): "TSH", "T4TOTAL", "FREET4", "T3FREE", "THYROIDAB" in the last 72 hours. Anemia Panel: No results for input(s): "VITAMINB12", "FOLATE", "FERRITIN", "TIBC", "IRON", "RETICCTPCT" in the last 72 hours. Urine analysis:    Component Value Date/Time   COLORURINE YELLOW (A) 12/06/2023 2157   APPEARANCEUR HAZY (A) 12/06/2023 2157   APPEARANCEUR Clear 02/17/2022 0853   LABSPEC 1.019 12/06/2023 2157   PHURINE 5.0 12/06/2023 2157   GLUCOSEU NEGATIVE 12/06/2023 2157   HGBUR NEGATIVE 12/06/2023 2157   BILIRUBINUR NEGATIVE 12/06/2023 2157   BILIRUBINUR Negative  02/17/2022 0853   KETONESUR 5 (A) 12/06/2023 2157   PROTEINUR NEGATIVE 12/06/2023 2157   NITRITE NEGATIVE 12/06/2023 2157   LEUKOCYTESUR TRACE (A) 12/06/2023 2157    Radiological Exams on Admission: No results found. EKG: Independently reviewed.  Sinus or ectopic atrial rhythm  Repeat EKG tomorrow a.m. on 3/7 Echocardiogram: no recent echo available  I reviewed all nursing notes, pharmacy notes, vitals, pertinent old records.  Assessment/Plan Principal Problem:   Generalized weakness    # Generalized weakness, unknown cause Possibly due to iron deficiency.  Troponin negative, CK within normal range Continue fall precautions, ambulate with assistance PT and OT eval  # Pyuria, possible UTI Leukocytosis, trend WBC count Continue ceftriaxone 1 g IV daily Follow urine culture   # Iron deficiency, transferrin saturation 9% Venofer IV 300 mg x 3 doses ordered, patient will need oral iron supplement at discharge. B12 level 330, goal >400, started oral B12 supplement. Vitamin D level 33, at lower end, started vitamin D supplement to prevent deficiency. Folic acid level within normal range.  History of CAD stable CABG Continue aspirin 81 mg p.o.  daily and Imdur 30 mg p.o. daily  BPH: continue Flomax GERD: Continued PPI   Nutrition: Cardiac diet DVT Prophylaxis: Subcutaneous Lovenox  Advance goals of care discussion: DNR/DNI-limited  Consults: None  Family Communication: family was present at bedside, at the time of interview.  Opportunity was given to ask question and all questions were answered satisfactorily.  Disposition: Admitted as observation, med-surge unit. Likely to be discharged Home, in 1-2 days when stable and able to ambulate well.  I have discussed plan of care as described above with RN and patient/family.  Severity of Illness: The appropriate patient status for this patient is OBSERVATION. Observation status is judged to be reasonable and necessary in order to provide the required intensity of service to ensure the patient's safety. The patient's presenting symptoms, physical exam findings, and initial radiographic and laboratory data in the context of their medical condition is felt to place them at decreased risk for further clinical deterioration. Furthermore, it is anticipated that the patient will be medically stable for discharge from the hospital within 2 midnights of admission.    Author: Gillis Santa, MD [facility] 12/07/2023 8:14 AM   To reach On-call, see care teams to locate the attending and reach out to them via [email or web address]. If 7PM-7AM, please contact night-coverage If you still have difficulty reaching the attending provider, please page the [facility] (Director on Call) for [facility] on amion for assistance.

## 2023-12-07 NOTE — Evaluation (Signed)
 Physical Therapy Evaluation Patient Details Name: Travis Schultz MRN: [id number] DOB: Sep 11, 1926 Today's Date: 12/07/2023  History of Present Illness  Pt is a 89 yo that presented to ED for weakness, workup for UTI. PMH includes CAD, chronic constipation, lymphoma, actinic keratosis, MI (5 bypass's), and chronic pain syndrome; gets ESI back injections; h/o back surgery and R RCR.   Clinical Impression  Patient alert, sitting with OT at bedside, denied pain. Pt lives alone and is normally independent, exercises for an hour daily. He was able to sit <> stand with no device but appeared to have some posterior lean, but supervision. Progressed to ambulation with SPC, ~281ft no LOB. He did have an occasional drift. Pt returned to room with needs in reach. Pt appears to be near his baseline but recommend follow up therapy to maximize return to PLOF including his exercise program.         If plan is discharge home, recommend the following: Help with stairs or ramp for entrance   Can travel by private vehicle        Equipment Recommendations None recommended by PT  Recommendations for Other Services       Functional Status Assessment Patient has had a recent decline in their functional status and demonstrates the ability to make significant improvements in function in a reasonable and predictable amount of time.     Precautions / Restrictions Precautions Precautions: Fall Recall of Precautions/Restrictions: Intact Restrictions Weight Bearing Restrictions Per Provider Order: No      Mobility  Bed Mobility               General bed mobility comments: pt sitting EOB with OT    Transfers   Equipment used: None, Straight cane               General transfer comment: potential mild posterior lean with initial standing but supervision    Ambulation/Gait Ambulation/Gait assistance: Contact guard assist, Supervision Gait Distance (Feet): 210 Feet Assistive device:  Straight cane         General Gait Details: no LOB, occasional drift of gait path noted  Stairs            Wheelchair Mobility     Tilt Bed    Modified Rankin (Stroke Patients Only)       Balance Overall balance assessment: Needs assistance Sitting-balance support: Feet supported Sitting balance-Leahy Scale: Good     Standing balance support: Single extremity supported Standing balance-Leahy Scale: Good                               Pertinent Vitals/Pain Pain Assessment Pain Assessment: Faces Pain Score: 0-No pain    Home Living Family/patient expects to be discharged to:: Private residence Living Arrangements: Spouse/significant other Available Help at Discharge: Family;Available 24 hours/day Type of Home: House Home Access: Level entry       Home Layout: One level Home Equipment: Agricultural consultant (2 wheels);Cane - single point      Prior Function Prior Level of Function : Independent/Modified Independent               ADLs Comments: assist from fiancee for cooking     Extremity/Trunk Assessment   Upper Extremity Assessment Upper Extremity Assessment: Generalized weakness    Lower Extremity Assessment Lower Extremity Assessment: Generalized weakness       Communication   Communication Communication: (P) Impaired Factors Affecting Communication: Hearing impaired  Cognition Arousal: Alert Behavior During Therapy: WFL for tasks assessed/performed   PT - Cognitive impairments: No apparent impairments                                 Cueing       General Comments      Exercises     Assessment/Plan    PT Assessment Patient needs continued PT services  PT Problem List Decreased activity tolerance;Decreased balance;Decreased mobility       PT Treatment Interventions DME instruction;Balance training;Neuromuscular re-education;Gait training;Functional mobility training;Stair training;Patient/family  education;Therapeutic activities;Therapeutic exercise    PT Goals (Current goals can be found in the Care Plan section)  Acute Rehab PT Goals Patient Stated Goal: to go home PT Goal Formulation: With patient Time For Goal Achievement: 12/21/23 Potential to Achieve Goals: Good    Frequency Min 1X/week     Co-evaluation               AM-PAC PT "6 Clicks" Mobility  Outcome Measure Help needed turning from your back to your side while in a flat bed without using bedrails?: None Help needed moving from lying on your back to sitting on the side of a flat bed without using bedrails?: None Help needed moving to and from a bed to a chair (including a wheelchair)?: None Help needed standing up from a chair using your arms (e.g., wheelchair or bedside chair)?: None Help needed to walk in hospital room?: None Help needed climbing 3-5 steps with a railing? : A Little 6 Click Score: 23    End of Session   Activity Tolerance: Patient tolerated treatment well Patient left: in chair;with call bell/phone within reach;with family/visitor present Nurse Communication: Mobility status PT Visit Diagnosis: Difficulty in walking, not elsewhere classified (R26.2)    Time: 4098-1191 PT Time Calculation (min) (ACUTE ONLY): 8 min   Charges:   PT Evaluation $PT Eval Low Complexity: 1 Low   PT General Charges $$ ACUTE PT VISIT: 1 Visit       Olga Coaster PT, DPT 11:42 AM,12/07/23

## 2023-12-07 NOTE — Evaluation (Signed)
 Occupational Therapy Evaluation Patient Details Name: Travis Schultz MRN: [id number] DOB: 06/18/1926 Today's Date: 12/07/2023   History of Present Illness   Pt is a 89 yo that presented to ED for weakness, workup for UTI. PMH includes CAD, chronic constipation, lymphoma, actinic keratosis, MI (5 bypass's), and chronic pain syndrome; gets ESI back injections; h/o back surgery and R RCR.   Clinical Impressions Travis Schultz was seen for OT evaluation this date. Prior to hospital admission, pt was MOD I using SPC. Pt lives alone with fiancee available as needed. Pt currently requires SUPERVISION + SPC for ADL t/f ~200 ft. MIN A don B socks and shoes, assist from fiancee. Educated on falls prevention and adapted dressing techniques. Orthostatics checked with no drop in BP noted. All education complete, no skilled acute OT needs, will sign off. Upon hospital discharge, recommend no OT follow up.    If plan is discharge home, recommend the following:   Help with stairs or ramp for entrance     Functional Status Assessment   Patient has had a recent decline in their functional status and demonstrates the ability to make significant improvements in function in a reasonable and predictable amount of time.     Equipment Recommendations   None recommended by OT     Recommendations for Other Services         Precautions/Restrictions   Precautions Precautions: Fall Recall of Precautions/Restrictions: Intact Restrictions Weight Bearing Restrictions Per Provider Order: No     Mobility Bed Mobility Overal bed mobility: Modified Independent                  Transfers Overall transfer level: Modified independent Equipment used: None               General transfer comment: rails      Balance Overall balance assessment: No apparent balance deficits (not formally assessed)                                         ADL either performed or assessed  with clinical judgement   ADL Overall ADL's : Needs assistance/impaired                                       General ADL Comments: SUPERVISION + SPC for ADL t/f ~200 ft. MIN A don B socks and shoes, assist from fiancee      Pertinent Vitals/Pain Pain Assessment Pain Assessment: Faces Faces Pain Scale: Hurts a little bit Pain Location: calves Pain Descriptors / Indicators: Discomfort Pain Intervention(s): Limited activity within patient's tolerance, Repositioned     Extremity/Trunk Assessment Upper Extremity Assessment Upper Extremity Assessment: Generalized weakness   Lower Extremity Assessment Lower Extremity Assessment: Generalized weakness       Communication Communication Communication: Impaired Factors Affecting Communication: Hearing impaired   Cognition Arousal: Alert Behavior During Therapy: WFL for tasks assessed/performed Cognition: No apparent impairments (age appropriate)                               Following commands: Intact       Cueing  General Comments   Cueing Techniques: Verbal cues              Home Living Family/patient expects to  be discharged to:: Private residence Living Arrangements: Spouse/significant other Available Help at Discharge: Family;Available 24 hours/day Type of Home: House Home Access: Level entry     Home Layout: One level               Home Equipment: Agricultural consultant (2 wheels);Cane - single point          Prior Functioning/Environment Prior Level of Function : Independent/Modified Independent               ADLs Comments: assist from fiancee for cooking    OT Problem List: Decreased range of motion        OT Goals(Current goals can be found in the care plan section)   Acute Rehab OT Goals Patient Stated Goal: to go home OT Goal Formulation: With patient Time For Goal Achievement: 12/07/23 Potential to Achieve Goals: Good   AM-PAC OT "6 Clicks" Daily  Activity     Outcome Measure Help from another person eating meals?: None Help from another person taking care of personal grooming?: None Help from another person toileting, which includes using toliet, bedpan, or urinal?: None Help from another person bathing (including washing, rinsing, drying)?: A Little Help from another person to put on and taking off regular upper body clothing?: None Help from another person to put on and taking off regular lower body clothing?: A Little 6 Click Score: 22   End of Session    Activity Tolerance: Patient tolerated treatment well Patient left: in chair;with call bell/phone within reach;with nursing/sitter in room;with family/visitor present  OT Visit Diagnosis: Other abnormalities of gait and mobility (R26.89)                Time: 0981-1914 OT Time Calculation (min): 20 min Charges:  OT General Charges $OT Visit: 1 Visit OT Evaluation $OT Eval Moderate Complexity: 1 Mod  Kathie Dike, M.S. OTR/L  12/07/23, 12:52 PM  ascom [phone]

## 2023-12-07 NOTE — ED Provider Notes (Signed)
-----------------------------------------   1:04 AM on 12/07/2023 -----------------------------------------   Patient ambulated to commode and initially felt fine but was unable to get back up and required 3 person assist to get back into the bed.  Will consult hospital services for evaluation and admission for generalized weakness and UTI.   Irean Hong, MD 12/07/23 [phone]

## 2023-12-08 ENCOUNTER — Other Ambulatory Visit: Payer: Self-pay

## 2023-12-08 DIAGNOSIS — R531 Weakness: Secondary | ICD-10-CM | POA: Diagnosis not present

## 2023-12-08 LAB — CBC
HCT: 28.8 % — ABNORMAL LOW (ref 39.0–52.0)
Hemoglobin: 10.3 g/dL — ABNORMAL LOW (ref 13.0–17.0)
MCH: 33.1 pg (ref 26.0–34.0)
MCHC: 35.8 g/dL (ref 30.0–36.0)
MCV: 92.6 fL (ref 80.0–100.0)
Platelets: 128 10*3/uL — ABNORMAL LOW (ref 150–400)
RBC: 3.11 MIL/uL — ABNORMAL LOW (ref 4.22–5.81)
RDW: 12.9 % (ref 11.5–15.5)
WBC: 9 10*3/uL (ref 4.0–10.5)
nRBC: 0 % (ref 0.0–0.2)

## 2023-12-08 LAB — URINE CULTURE: Culture: >=100000 — AB

## 2023-12-08 LAB — HEPATIC FUNCTION PANEL
ALT: 17 U/L (ref 0–44)
AST: 30 U/L (ref 15–41)
Albumin: 3.1 g/dL — ABNORMAL LOW (ref 3.5–5.0)
Alkaline Phosphatase: 54 U/L (ref 38–126)
Bilirubin, Direct: 0.3 mg/dL — ABNORMAL HIGH (ref 0.0–0.2)
Indirect Bilirubin: 1.5 mg/dL — ABNORMAL HIGH (ref 0.3–0.9)
Total Bilirubin: 1.8 mg/dL — ABNORMAL HIGH (ref 0.0–1.2)
Total Protein: 5.5 g/dL — ABNORMAL LOW (ref 6.5–8.1)

## 2023-12-08 LAB — BASIC METABOLIC PANEL
Anion gap: 9 (ref 5–15)
BUN: 17 mg/dL (ref 8–23)
CO2: 23 mmol/L (ref 22–32)
Calcium: 8.2 mg/dL — ABNORMAL LOW (ref 8.9–10.3)
Chloride: 107 mmol/L (ref 98–111)
Creatinine, Ser: 1.01 mg/dL (ref 0.61–1.24)
GFR, Estimated: >60 mL/min (ref >60)
Glucose, Bld: 119 mg/dL — ABNORMAL HIGH (ref 70–99)
Potassium: 3.8 mmol/L (ref 3.5–5.1)
Sodium: 139 mmol/L (ref 135–145)

## 2023-12-08 LAB — MAGNESIUM: Magnesium: 2.2 mg/dL (ref 1.7–2.4)

## 2023-12-08 LAB — PHOSPHORUS: Phosphorus: 2.7 mg/dL (ref 2.5–4.6)

## 2023-12-08 MED ORDER — POLYSACCHARIDE IRON COMPLEX 150 MG PO CAPS
150.0000 mg | ORAL_CAPSULE | Freq: Every day | ORAL | 2 refills | Status: AC
  Filled 2023-12-08: qty 30, 30d supply, fill #0

## 2023-12-08 MED ORDER — CYANOCOBALAMIN 1000 MCG PO TABS
1000.0000 ug | ORAL_TABLET | Freq: Every day | ORAL | 2 refills | Status: AC
  Filled 2023-12-08: qty 30, 30d supply, fill #0

## 2023-12-08 MED ORDER — POLYETHYLENE GLYCOL 3350 17 GM/SCOOP PO POWD
17.0000 g | Freq: Every day | ORAL | 0 refills | Status: DC
  Filled 2023-12-08: qty 238, 14d supply, fill #0

## 2023-12-08 MED ORDER — VITAMIN D (ERGOCALCIFEROL) 1.25 MG (50000 UNIT) PO CAPS
50000.0000 [IU] | ORAL_CAPSULE | ORAL | 0 refills | Status: DC
  Filled 2023-12-08: qty 12, 84d supply, fill #0

## 2023-12-08 MED ORDER — AMOXICILLIN-POT CLAVULANATE 875-125 MG PO TABS
1.0000 | ORAL_TABLET | Freq: Two times a day (BID) | ORAL | 0 refills | Status: AC
  Filled 2023-12-08: qty 6, 3d supply, fill #0

## 2023-12-08 NOTE — Discharge Summary (Signed)
 [facility] Discharge Summary   Patient: Travis Schultz ZOX:[id number]  PCP: Lauro Regulus, MD  Date of admission: 12/06/2023   Date of discharge: 12/08/2023     Discharge Diagnoses:  Principal Problem:   Generalized weakness   Admitted From: Home Disposition:  Home   Recommendations for Outpatient Follow-up:  PCP: In 1 week Urology already has an appointment on 12/11/2023 Follow up LABS/TEST: Urine culture which is pending, sample sent before discharge.   Follow-up Information     Lauro Regulus, MD. Go on 12/19/2023.   Specialty: Internal Medicine Why: @ 1:15pm Contact information: [address] [place] [address] [phone]                Diet recommendation: Cardiac diet  Activity: The patient is advised to gradually reintroduce usual activities, as tolerated  Discharge Condition: stable  Code Status: DNR   History of present illness: As per the H and P dictated on admission Hospital Course:  Travis Schultz is a 89 y.o. male with Past medical history of CAD status post CABG, lymphoma, lumbar degenerative joint disease, as reviewed from EMR, presented at [facility] ED with generalized weakness and fall.  As per patient was feeling weak in the bed and he slid out of the bed, he was able to get up himself from the ground.  Patient was also unable to get out of the toilet, feels generalized weakness.  UA mildly positive, patient also had tremors in his hand before the fall and also had tremor in the hand off and on, resolves itself, not constant.  Denied any shortness of breath, no chest pain or palpitation, no any other complaints.  Denied any fever or chills, no dysuria     ED Course: VS afebrile, HR 91, RR 15, BP 147/69, 95% on RA BMP blood glucose 123, calcium 8.6, TB 1.9, rest within normal range. CBC WBC 13.2, Hb 11.6 UA LE trace, nitrate negative, bacteria none, WBC 21-50, RBC 6-10 Urine culture collected   Assessment/Plan   #  Generalized weakness, unknown cause Possibly due to iron deficiency.  Troponin negative, CK within normal range Continue fall precautions, ambulate with assistance PT and OT eval done, no rehab needs. Weakness improved.   # Pyuria, possible UTI Leukocytosis, wbc trended down to normal S/p ceftriaxone 1 g IV daily, Ucx grew multiple species, suggested recollection. Patient denies any urinary symptoms, transition to oral antibiotics Augmentin twice daily for 3 days.  Patient was advised to provide another urine sample for culture which he needs to follow as an outpatient.  Patient has an appointment with urology on 12/11/2023.   # Iron deficiency, transferrin saturation 9% S/p Venofer IV 300 mg x 2 doses given during hospital stay.  Started oral iron supplement at discharge.  Follow-up with PCP to repeat iron profile after 3 to 6 months. # B12 level 330, goal >400, started oral B12 supplement. # Vitamin D level 33, at lower end, started vitamin D supplement to prevent deficiency.  Folic acid level within normal range.   # History of CAD stable CABG Continue aspirin 81 mg p.o. daily and Imdur 30 mg p.o. daily # BPH: continue Flomax # GERD: Continued PPI  Body mass index is 25.77 kg/m.  Nutrition Interventions:  Patient was seen by physical therapy, who recommended Home health, but patient does not wanted home health PT/OT.  Patient has walker at home that he does not use.   On the day of the discharge the  patient's vitals were stable, and no other acute medical condition were reported by patient. the patient was felt safe to be discharge at Home.  Consultants: None Procedures: None  Discharge Exam: General: Appear in no distress, no Rash; Oral Mucosa Clear, moist. Cardiovascular: S1 and S2 Present, no Murmur, Respiratory: normal respiratory effort, Bilateral Air entry present and no Crackles, no wheezes Abdomen: Bowel Sound present, Soft and no tenderness, no hernia Extremities: no  Pedal edema, no calf tenderness Neurology: AAO x 3, no focal deficits.  affect appropriate.  Filed Weights   12/06/23 2109  Weight: 86.2 kg   Vitals:   12/07/23 2005 12/08/23 0825  BP: (!) 157/63 (!) 144/61  Pulse: 75 81  Resp: 18 17  Temp: 97.8 F (36.6 C) (!) 97 F (36.1 C)  SpO2: 96% 95%    DISCHARGE MEDICATION: Allergies as of 12/08/2023       Reactions   Phenergan [promethazine Hcl]    Altered mental status   Promethazine Hcl         Medication List     TAKE these medications    acetaminophen 325 MG tablet Commonly known as: TYLENOL Take 325 mg by mouth at bedtime.   amoxicillin-clavulanate 875-125 MG tablet Commonly known as: AUGMENTIN Take 1 tablet by mouth 2 (two) times daily for 3 days.   aspirin 81 MG tablet Take 81 mg by mouth at bedtime.   cyanocobalamin 1000 MCG tablet Take 1 tablet (1,000 mcg total) by mouth daily. Start taking on: December 09, 2023   docusate sodium 100 MG capsule Commonly known as: COLACE Take 1 capsule by mouth as needed.   ICaps Areds 2 Caps Take 1 capsule by mouth 2 (two) times daily.   iron polysaccharides 150 MG capsule Commonly known as: NIFEREX Take 1 capsule (150 mg total) by mouth daily.   isosorbide mononitrate 30 MG 24 hr tablet Commonly known as: IMDUR Take 30 mg by mouth daily.   naproxen sodium 220 MG tablet Commonly known as: ALEVE Take 220 mg by mouth daily.   nitroGLYCERIN 0.4 MG SL tablet Commonly known as: NITROSTAT Place 0.4 mg under the tongue every 5 (five) minutes as needed for chest pain.   pantoprazole 40 MG tablet Commonly known as: PROTONIX Take 1 tablet (40 mg total) by mouth 2 (two) times daily.   polyethylene glycol 17 g packet Commonly known as: MIRALAX / GLYCOLAX Take 17 g by mouth daily.   pravastatin 40 MG tablet Commonly known as: PRAVACHOL Take 40 mg by mouth at bedtime.   silodosin 4 MG Caps capsule Commonly known as: RAPAFLO Take 1 capsule (4 mg total) by mouth daily  with breakfast.   Vitamin D (Ergocalciferol) 1.25 MG (50000 UNIT) Caps capsule Commonly known as: DRISDOL Take 1 capsule (50,000 Units total) by mouth every 7 (seven) days. Start taking on: December 15, 2023       Allergies  Allergen Reactions   Phenergan [Promethazine Hcl]     Altered mental status   Promethazine Hcl    Discharge Instructions     Call MD for:  difficulty breathing, headache or visual disturbances   Complete by: As directed    Call MD for:  extreme fatigue   Complete by: As directed    Call MD for:  persistant dizziness or light-headedness   Complete by: As directed    Call MD for:  persistant nausea and vomiting   Complete by: As directed    Call MD for:  severe uncontrolled  pain   Complete by: As directed    Call MD for:  temperature >100.4   Complete by: As directed    Diet - low sodium heart healthy   Complete by: As directed    Discharge instructions   Complete by: As directed    F/u PCP in 1 wk, f/u UCx report, new sample to be collected before discharge F/u Urology as schedule on 12/11/23   Increase activity slowly   Complete by: As directed        The results of significant diagnostics from this hospitalization (including imaging, microbiology, ancillary and laboratory) are listed below for reference.    Significant Diagnostic Studies: No results found.  Microbiology: Recent Results (from the past 240 hours)  Resp panel by RT-PCR (RSV, Flu A&B, Covid) Anterior Nasal Swab     Status: None   Collection Time: 12/06/23  9:13 PM   Specimen: Anterior Nasal Swab  Result Value Ref Range Status   SARS Coronavirus 2 by RT PCR NEGATIVE NEGATIVE Final    Comment: (NOTE) SARS-CoV-2 target nucleic acids are NOT DETECTED.  The SARS-CoV-2 RNA is generally detectable in upper respiratory specimens during the acute phase of infection. The lowest concentration of SARS-CoV-2 viral copies this assay can detect is 138 copies/mL. A negative result does not  preclude SARS-Cov-2 infection and should not be used as the sole basis for treatment or other patient management decisions. A negative result may occur with  improper specimen collection/handling, submission of specimen other than nasopharyngeal swab, presence of viral mutation(s) within the areas targeted by this assay, and inadequate number of viral copies(<138 copies/mL). A negative result must be combined with clinical observations, patient history, and epidemiological information. The expected result is Negative.  Fact Sheet for Patients:  BloggerCourse.com  Fact Sheet for Healthcare Providers:  SeriousBroker.it  This test is no t yet approved or cleared by the [place] FDA and  has been authorized for detection and/or diagnosis of SARS-CoV-2 by FDA under an Emergency Use Authorization (EUA). This EUA will remain  in effect (meaning this test can be used) for the duration of the COVID-19 declaration under Section 564(b)(1) of the Act, 21 U.S.C.section 360bbb-3(b)(1), unless the authorization is terminated  or revoked sooner.       Influenza A by PCR NEGATIVE NEGATIVE Final   Influenza B by PCR NEGATIVE NEGATIVE Final    Comment: (NOTE) The Xpert Xpress SARS-CoV-2/FLU/RSV plus assay is intended as an aid in the diagnosis of influenza from Nasopharyngeal swab specimens and should not be used as a sole basis for treatment. Nasal washings and aspirates are unacceptable for Xpert Xpress SARS-CoV-2/FLU/RSV testing.  Fact Sheet for Patients: BloggerCourse.com  Fact Sheet for Healthcare Providers: SeriousBroker.it  This test is not yet approved or cleared by the [place] FDA and has been authorized for detection and/or diagnosis of SARS-CoV-2 by FDA under an Emergency Use Authorization (EUA). This EUA will remain in effect (meaning this test can be used) for the  duration of the COVID-19 declaration under Section 564(b)(1) of the Act, 21 U.S.C. section 360bbb-3(b)(1), unless the authorization is terminated or revoked.     Resp Syncytial Virus by PCR NEGATIVE NEGATIVE Final    Comment: (NOTE) Fact Sheet for Patients: BloggerCourse.com  Fact Sheet for Healthcare Providers: SeriousBroker.it  This test is not yet approved or cleared by the [place] FDA and has been authorized for detection and/or diagnosis of SARS-CoV-2 by FDA under an Emergency Use Authorization (EUA). This EUA will  remain in effect (meaning this test can be used) for the duration of the COVID-19 declaration under Section 564(b)(1) of the Act, 21 U.S.C. section 360bbb-3(b)(1), unless the authorization is terminated or revoked.  Performed at [facility], [address] [place], [place] [address]   Urine Culture     Status: Abnormal   Collection Time: 12/06/23  9:57 PM   Specimen: Urine, Random  Result Value Ref Range Status   Specimen Description   Final    URINE, RANDOM Performed at [facility], [address] [place], [place] [address]    Special Requests   Final    NONE Performed at [facility], [address] [place], [place] [address]    Culture (A)  Final    >=100,000 COLONIES/mL MULTIPLE SPECIES PRESENT, SUGGEST RECOLLECTION   Report Status 12/08/2023 FINAL  Final     Labs: CBC: Recent Labs  Lab 12/06/23 2119 12/07/23 0838 12/08/23 0459  WBC 13.2* 13.1* 9.0  HGB 11.6* 11.0* 10.3*  HCT 33.1* 31.2* 28.8*  MCV 93.5 91.8 92.6  PLT 158 142* 128*   Basic Metabolic Panel: Recent Labs  Lab 12/06/23 2119 12/06/23 2327 12/07/23 0838 12/08/23 0459  NA 136  --  138 139  K 4.1  --  3.8 3.8  CL 103  --  105 107  CO2 24  --  25 23  GLUCOSE 123*  --  116* 119*  BUN 21  --  19 17  CREATININE 1.02  --  0.96 1.01  CALCIUM 8.6*  --  8.2* 8.2*  MG  --  2.1  --  2.2   PHOS  --  2.9  --  2.7   Liver Function Tests: Recent Labs  Lab 12/06/23 2119 12/08/23 0459  AST 25 30  ALT 20 17  ALKPHOS 67 54  BILITOT 1.9* 1.8*  PROT 6.6 5.5*  ALBUMIN 4.2 3.1*   Recent Labs  Lab 12/06/23 2119  LIPASE 18   No results for input(s): "AMMONIA" in the last 168 hours. Cardiac Enzymes: Recent Labs  Lab 12/07/23 0838  CKTOTAL 58   BNP (last 3 results) No results for input(s): "BNP" in the last 8760 hours. CBG: No results for input(s): "GLUCAP" in the last 168 hours.  Time spent: 35 minutes  Signed:  Gillis Santa  [facility] 12/08/2023 12:00 PM

## 2023-12-08 NOTE — TOC Initial Note (Signed)
 Transition of Care [facility]) - Initial/Assessment Note    Patient Details  Name: Travis Schultz MRN: [id number] Date of Birth: 11-11-1925  Transition of Care [facility] CM/SW Contact:    Erin Sons, LCSW Phone Number: 12/08/2023, 10:51 AM  Clinical Narrative:                  CSW met with pt and significant other bedside to discuss therapy recommendations. Pt lives at home alone in [place]. States he exercises for about an hour a day with stretches, light weights, and stationary bike. He had HH in the past and did not find it useful. Pt does not want HH or OPPT. He has a walker at home that he doesn't use. Pt has no other TOC needs.   Expected Discharge Plan: Home/Self Care Barriers to Discharge: No Barriers Identified   Patient Goals and CMS Choice Patient states their goals for this hospitalization and ongoing recovery are:: Return home          Expected Discharge Plan and Services       Living arrangements for the past 2 months: Single Family Home Expected Discharge Date: 12/08/23                                    Prior Living Arrangements/Services Living arrangements for the past 2 months: Single Family Home Lives with:: Self Patient language and need for interpreter reviewed:: Yes        Need for Family Participation in Patient Care: No (Comment) Care giver support system in place?: Yes (comment)   Criminal Activity/Legal Involvement Pertinent to Current Situation/Hospitalization: No - Comment as needed  Activities of Daily Living   ADL Screening (condition at time of admission) Independently performs ADLs?: Yes (appropriate for developmental age) Is the patient deaf or have difficulty hearing?: No Does the patient have difficulty seeing, even when wearing glasses/contacts?: No Does the patient have difficulty concentrating, remembering, or making decisions?: No  Permission Sought/Granted                  Emotional Assessment Appearance::  Appears stated age Attitude/Demeanor/Rapport: Engaged Affect (typically observed): Accepting Orientation: : Oriented to Self, Oriented to Place, Oriented to  Time, Oriented to Situation Alcohol / Substance Use: Not Applicable Psych Involvement: No (comment)  Admission diagnosis:  Lower urinary tract infectious disease [N39.0] Weakness [R53.1] Generalized weakness [R53.1] Patient Active Problem List   Diagnosis Date Noted   Generalized weakness 12/07/2023   Bacteremia due to Escherichia coli 12/14/2021   Acute cystitis 12/13/2021   Acute cystitis without hematuria 12/12/2021   AKI (acute kidney injury) (HCC) 12/12/2021   Fall at home, initial encounter 12/12/2021   DNR (do not resuscitate) 12/12/2021   Belching    Abdominal pain 01/04/2021   Lumbar facet arthropathy 04/16/2018   Spondylosis without myelopathy or radiculopathy, lumbar region 04/16/2018   Lumbar degenerative disc disease 04/16/2018   Bilateral primary osteoarthritis of knee 04/16/2018   Chronic pain syndrome 04/16/2018   Lymphoma (HCC) 1991   CAD (coronary artery disease) 1991   PCP:  Lauro Regulus, MD Pharmacy:   [facility] - [place], [place] - [address] [place] [address] Phone: [phone] Fax: [phone]  [facility] [address] [place] [address] Phone: [phone] Fax: [phone]     Social Drivers of Health (SDOH) Social History:  SDOH Screenings   Food Insecurity: No Food Insecurity (12/07/2023)  Housing: Low Risk  (12/07/2023)  Transportation Needs: No Transportation Needs (12/07/2023)  Utilities: Not At Risk (12/07/2023)  Depression (PHQ2-9): Low Risk  (11/01/2018)  Financial Resource Strain: Low Risk  (04/24/2023)   [facility]  Social Connections: Moderately Integrated (12/07/2023)  Tobacco Use: Low Risk  (12/07/2023)   SDOH Interventions:     Readmission Risk  Interventions    12/13/2021    4:02 PM  Readmission Risk Prevention Plan  Post Dischage Appt Complete  Medication Screening Complete  Transportation Screening Complete

## 2023-12-08 NOTE — Care Management Obs Status (Signed)
 MEDICARE OBSERVATION STATUS NOTIFICATION   Patient Details  Name: Travis Schultz MRN: [id number] Date of Birth: 17-Nov-1925   Medicare Observation Status Notification Given:  Orland Dec, CMA 12/08/2023, 9:15 AM

## 2023-12-08 NOTE — Plan of Care (Signed)

## 2023-12-10 LAB — URINE CULTURE: Culture: 70000 — AB

## 2023-12-11 DIAGNOSIS — I10 Essential (primary) hypertension: Secondary | ICD-10-CM | POA: Diagnosis not present

## 2023-12-11 DIAGNOSIS — I25118 Atherosclerotic heart disease of native coronary artery with other forms of angina pectoris: Secondary | ICD-10-CM | POA: Diagnosis not present

## 2023-12-11 DIAGNOSIS — R7303 Prediabetes: Secondary | ICD-10-CM | POA: Diagnosis not present

## 2023-12-11 DIAGNOSIS — E782 Mixed hyperlipidemia: Secondary | ICD-10-CM | POA: Diagnosis not present

## 2023-12-11 DIAGNOSIS — I5022 Chronic systolic (congestive) heart failure: Secondary | ICD-10-CM | POA: Diagnosis not present

## 2023-12-11 DIAGNOSIS — I6523 Occlusion and stenosis of bilateral carotid arteries: Secondary | ICD-10-CM | POA: Diagnosis not present

## 2023-12-15 ENCOUNTER — Ambulatory Visit: Admitting: Physician Assistant

## 2023-12-15 VITALS — BP 126/69 | HR 88 | Ht 72.0 in | Wt 177.6 lb

## 2023-12-15 DIAGNOSIS — R32 Unspecified urinary incontinence: Secondary | ICD-10-CM | POA: Diagnosis not present

## 2023-12-15 DIAGNOSIS — R339 Retention of urine, unspecified: Secondary | ICD-10-CM | POA: Diagnosis not present

## 2023-12-15 DIAGNOSIS — R8281 Pyuria: Secondary | ICD-10-CM

## 2023-12-15 DIAGNOSIS — N3946 Mixed incontinence: Secondary | ICD-10-CM

## 2023-12-15 DIAGNOSIS — K5900 Constipation, unspecified: Secondary | ICD-10-CM | POA: Diagnosis not present

## 2023-12-15 LAB — URINALYSIS, COMPLETE
Bilirubin, UA: NEGATIVE
Glucose, UA: NEGATIVE
Leukocytes,UA: NEGATIVE
Nitrite, UA: NEGATIVE
Protein,UA: NEGATIVE
RBC, UA: NEGATIVE
Specific Gravity, UA: 1.025 (ref 1.005–1.030)
Urobilinogen, Ur: 0.2 mg/dL (ref 0.2–1.0)
pH, UA: 5 (ref 5.0–7.5)

## 2023-12-15 LAB — MICROSCOPIC EXAMINATION
Bacteria, UA: NONE SEEN
RBC, Urine: NONE SEEN /HPF (ref 0–2)

## 2023-12-15 LAB — BLADDER SCAN AMB NON-IMAGING

## 2023-12-15 MED ORDER — GEMTESA 75 MG PO TABS: 75.0000 mg | ORAL_TABLET | Freq: Every day | ORAL | Status: DC

## 2023-12-15 NOTE — Progress Notes (Signed)
 12/15/2023 12:40 PM   Travis Schultz [birth year] [id number]  CC: Chief Complaint  Patient presents with   post ED   HPI: Travis Schultz is a 89 y.o. male with PMH prostate cancer s/p radical prostatectomy at [facility] in 1993, incomplete bladder emptying on Rapaflo, mixed urge and stress incontinence, nocturia, and constipation who presents today for hospital follow-up of UTI.  He is accompanied today by his friend, Dennie Bible, who contributes to HPI.  He was admitted for 12/06/2023 to 12/08/2023 with generalized weakness of unclear etiology.  He was found to have pyuria and leukocytosis and was treated with Rocephin and Augmentin.  Urine culture grew multiple species.  He was started on iron supplements for iron deficiency.  He was also encouraged to start daily MiraLAX.  Today he reports he had an extremely large, soft, very dark/almost black bowel movement 2 days ago.  He has not taken MiraLAX for several days.  He complains of bothersome urinary leakage and nocturia x2-3.  In-office UA today positive for trace ketones; urine microscopy pan negative. PVR .  PMH: Past Medical History:  Diagnosis Date   Actinic keratosis    Lymphoma (HCC) 1991   Myocardial infarction (HCC) 06/17/2009   5 bypass's    Surgical History: Past Surgical History:  Procedure Laterality Date   BACK SURGERY     after injury to back    CARDIAC SURGERY     CAROTID ARTERY ANGIOPLASTY N/A 02/24/2010   CATARACT EXTRACTION Bilateral 2012   EYE SURGERY Bilateral 2012   PROSTATE SURGERY     ROTATOR CUFF REPAIR Right 2008    Home Medications:  Allergies as of 12/15/2023       Reactions   Phenergan [promethazine Hcl]    Altered mental status   Promethazine Hcl         Medication List        Accurate as of December 15, 2023 12:40 PM. If you have any questions, ask your nurse or doctor.          acetaminophen 325 MG tablet Commonly known as: TYLENOL Take 325 mg by mouth at bedtime.   aspirin  81 MG tablet Take 81 mg by mouth at bedtime.   B-12 1000 MCG Tabs Take 1 tablet (1,000 mcg total) by mouth daily.   docusate sodium 100 MG capsule Commonly known as: COLACE Take 1 capsule by mouth as needed.   Ferrex 150 150 MG capsule Generic drug: iron polysaccharides Take 1 capsule (150 mg total) by mouth daily.   Gemtesa 75 MG Tabs Generic drug: Vibegron Take 1 tablet (75 mg total) by mouth daily.   ICaps Areds 2 Caps Take 1 capsule by mouth 2 (two) times daily.   isosorbide mononitrate 30 MG 24 hr tablet Commonly known as: IMDUR Take 30 mg by mouth daily.   naproxen sodium 220 MG tablet Commonly known as: ALEVE Take 220 mg by mouth daily.   nitroGLYCERIN 0.4 MG SL tablet Commonly known as: NITROSTAT Place 0.4 mg under the tongue every 5 (five) minutes as needed for chest pain.   pantoprazole 40 MG tablet Commonly known as: PROTONIX Take 1 tablet (40 mg total) by mouth 2 (two) times daily.   polyethylene glycol powder 17 GM/SCOOP powder Commonly known as: GLYCOLAX/MIRALAX Take 17 g by mouth daily.   pravastatin 40 MG tablet Commonly known as: PRAVACHOL Take 40 mg by mouth at bedtime.   silodosin 4 MG Caps capsule Commonly known as: RAPAFLO Take 1 capsule (  4 mg total) by mouth daily with breakfast.   Vitamin D (Ergocalciferol) 1.25 MG (50000 UNIT) Caps capsule Commonly known as: DRISDOL Take 1 capsule (50,000 Units total) by mouth every 7 (seven) days.        Allergies:  Allergies  Allergen Reactions   Phenergan [Promethazine Hcl]     Altered mental status   Promethazine Hcl     Family History: Family History  Problem Relation Age of Onset   Heart disease Father    Heart disease Brother     Social History:   reports that he has never smoked. He has never used smokeless tobacco. He reports that he does not drink alcohol and does not use drugs.  Physical Exam: BP 126/69   Pulse 88   Ht 6' (1.829 m)   Wt 177 lb 9.6 oz (80.6 kg)   BMI  24.09 kg/m   Constitutional:  Alert and oriented, no acute distress, nontoxic appearing HEENT: [place], AT Cardiovascular: No clubbing, cyanosis, or edema Respiratory: Normal respiratory effort, no increased work of breathing Skin: No rashes, bruises or suspicious lesions Neurologic: Grossly intact, no focal deficits, moving all 4 extremities Psychiatric: Normal mood and affect  Laboratory Data: Results for orders placed or performed in visit on 12/15/23  Microscopic Examination   Collection Time: 12/15/23 10:33 AM   Urine  Result Value Ref Range   WBC, UA 0-5 0 - 5 /hpf   RBC, Urine None seen 0 - 2 /hpf   Epithelial Cells (non renal) 0-10 0 - 10 /hpf   Casts Present (A) None seen /lpf   Cast Type Hyaline casts N/A   Mucus, UA Present (A) Not Estab.   Bacteria, UA None seen None seen/Few  Urinalysis, Complete   Collection Time: 12/15/23 10:33 AM  Result Value Ref Range   Specific Gravity, UA 1.025 1.005 - 1.030   pH, UA 5.0 5.0 - 7.5   Color, UA Yellow Yellow   Appearance Ur Clear Clear   Leukocytes,UA Negative Negative   Protein,UA Negative Negative/Trace   Glucose, UA Negative Negative   Ketones, UA Trace (A) Negative   RBC, UA Negative Negative   Bilirubin, UA Negative Negative   Urobilinogen, Ur 0.2 0.2 - 1.0 mg/dL   Nitrite, UA Negative Negative   Microscopic Examination See below:   BLADDER SCAN AMB NON-IMAGING   Collection Time: 12/15/23 10:41 AM  Result Value Ref Range   Scan Result    Assessment & Plan:   1. Pyuria (Primary) Resolved. - Urinalysis, Complete  2. Incomplete bladder emptying He continues to empty appropriately on Rapaflo, will continue this.  Question possible bladder neck contracture, however given his age I think it is appropriate to defer cystoscopy in favor of pharmacotherapy for now. - BLADDER SCAN AMB NON-IMAGING  3. Mixed stress and urge urinary incontinence I gave him 6 weeks of Gemtesa samples today, will see him back for symptom  recheck and PVR.  If he fails Gemtesa, will pursue cystoscopy as above. - Vibegron (GEMTESA) 75 MG TABS; Take 1 tablet (75 mg total) by mouth daily.  4. Constipation, unspecified constipation type Suspect combination of iron supplements and MiraLAX contributed to his recent large-volume very dark stool.  He is clear that it was not tarry in character, inconsistent with melena.  He is scheduled for follow-up with Dr. Dareen Piano next week.  Discussed continuing MiraLAX daily, okay to reduce dose to half a dose daily if needed.  Return in about 6 weeks (around 01/26/2024)  for Symptom recheck with PVR.  Carman Ching, PA-C  [facility] [place] [address] [place], [place] [address] [phone]

## 2023-12-19 DIAGNOSIS — I5022 Chronic systolic (congestive) heart failure: Secondary | ICD-10-CM | POA: Diagnosis not present

## 2023-12-19 DIAGNOSIS — M48061 Spinal stenosis, lumbar region without neurogenic claudication: Secondary | ICD-10-CM | POA: Diagnosis not present

## 2024-01-15 ENCOUNTER — Telehealth: Payer: Self-pay | Admitting: Physician Assistant

## 2024-01-15 NOTE — Telephone Encounter (Signed)
 Pt's wife was informed that we will give the pt 6 more weeks of gemtesa samples. Pt's wife voiced understanding.

## 2024-01-15 NOTE — Telephone Encounter (Signed)
 Patient called and said he has ran out of Gemtesa samples and would like to know if he can pick up some more samples if possible or if a RX can be sent to pharmacy. Patient states that Travis Schultz helped a lot and since running out, he has been up more throughout the night urinating. Please advise patient.

## 2024-01-16 ENCOUNTER — Emergency Department
Admission: EM | Admit: 2024-01-16 | Discharge: 2024-01-16 | Disposition: A | Discharge status: Home / Self Care | Attending: Emergency Medicine | Admitting: Emergency Medicine

## 2024-01-16 ENCOUNTER — Other Ambulatory Visit: Payer: Self-pay

## 2024-01-16 ENCOUNTER — Emergency Department

## 2024-01-16 DIAGNOSIS — Q600 Renal agenesis, unilateral: Secondary | ICD-10-CM | POA: Diagnosis not present

## 2024-01-16 DIAGNOSIS — K5730 Diverticulosis of large intestine without perforation or abscess without bleeding: Secondary | ICD-10-CM | POA: Diagnosis not present

## 2024-01-16 DIAGNOSIS — Z951 Presence of aortocoronary bypass graft: Secondary | ICD-10-CM | POA: Diagnosis not present

## 2024-01-16 DIAGNOSIS — R9431 Abnormal electrocardiogram [ECG] [EKG]: Secondary | ICD-10-CM | POA: Diagnosis not present

## 2024-01-16 DIAGNOSIS — N3000 Acute cystitis without hematuria: Secondary | ICD-10-CM

## 2024-01-16 DIAGNOSIS — R109 Unspecified abdominal pain: Secondary | ICD-10-CM

## 2024-01-16 DIAGNOSIS — R1031 Right lower quadrant pain: Secondary | ICD-10-CM | POA: Diagnosis not present

## 2024-01-16 DIAGNOSIS — I2510 Atherosclerotic heart disease of native coronary artery without angina pectoris: Secondary | ICD-10-CM | POA: Insufficient documentation

## 2024-01-16 DIAGNOSIS — N281 Cyst of kidney, acquired: Secondary | ICD-10-CM | POA: Diagnosis not present

## 2024-01-16 DIAGNOSIS — C61 Malignant neoplasm of prostate: Secondary | ICD-10-CM | POA: Diagnosis not present

## 2024-01-16 LAB — URINALYSIS, ROUTINE W REFLEX MICROSCOPIC
Bilirubin Urine: NEGATIVE
Glucose, UA: NEGATIVE mg/dL
Hgb urine dipstick: NEGATIVE
Ketones, ur: NEGATIVE mg/dL
Nitrite: NEGATIVE
Protein, ur: 100 mg/dL — AB
Specific Gravity, Urine: 1.038 — ABNORMAL HIGH (ref 1.005–1.030)
Squamous Epithelial / HPF: 0 /HPF (ref 0–5)
WBC, UA: >50 WBC/hpf (ref 0–5)
pH: 5 (ref 5.0–8.0)

## 2024-01-16 LAB — COMPREHENSIVE METABOLIC PANEL WITH GFR
ALT: 19 U/L (ref 0–44)
AST: 23 U/L (ref 15–41)
Albumin: 3.9 g/dL (ref 3.5–5.0)
Alkaline Phosphatase: 67 U/L (ref 38–126)
Anion gap: 5 (ref 5–15)
BUN: 27 mg/dL — ABNORMAL HIGH (ref 8–23)
CO2: 29 mmol/L (ref 22–32)
Calcium: 9.1 mg/dL (ref 8.9–10.3)
Chloride: 105 mmol/L (ref 98–111)
Creatinine, Ser: 1.36 mg/dL — ABNORMAL HIGH (ref 0.61–1.24)
GFR, Estimated: 48 mL/min — ABNORMAL LOW (ref >60)
Glucose, Bld: 120 mg/dL — ABNORMAL HIGH (ref 70–99)
Potassium: 5 mmol/L (ref 3.5–5.1)
Sodium: 139 mmol/L (ref 135–145)
Total Bilirubin: 1.6 mg/dL — ABNORMAL HIGH (ref 0.0–1.2)
Total Protein: 6.6 g/dL (ref 6.5–8.1)

## 2024-01-16 LAB — CBC
HCT: 34.6 % — ABNORMAL LOW (ref 39.0–52.0)
Hemoglobin: 12.1 g/dL — ABNORMAL LOW (ref 13.0–17.0)
MCH: 33.6 pg (ref 26.0–34.0)
MCHC: 35 g/dL (ref 30.0–36.0)
MCV: 96.1 fL (ref 80.0–100.0)
Platelets: 162 10*3/uL (ref 150–400)
RBC: 3.6 MIL/uL — ABNORMAL LOW (ref 4.22–5.81)
RDW: 12.8 % (ref 11.5–15.5)
WBC: 8.4 10*3/uL (ref 4.0–10.5)
nRBC: 0 % (ref 0.0–0.2)

## 2024-01-16 LAB — TROPONIN I (HIGH SENSITIVITY)
Troponin I (High Sensitivity): 8 ng/L (ref <18)
Troponin I (High Sensitivity): 8 ng/L (ref <18)

## 2024-01-16 LAB — LIPASE, BLOOD: Lipase: 20 U/L (ref 11–51)

## 2024-01-16 MED ORDER — LIDOCAINE 5 % EX PTCH
1.0000 | MEDICATED_PATCH | CUTANEOUS | Status: DC
  Administered 2024-01-16: 1 via TRANSDERMAL
  Filled 2024-01-16: qty 1

## 2024-01-16 MED ORDER — FENTANYL CITRATE PF 50 MCG/ML IJ SOSY
25.0000 ug | PREFILLED_SYRINGE | Freq: Once | INTRAMUSCULAR | Status: AC
  Administered 2024-01-16: 25 ug via INTRAVENOUS
  Filled 2024-01-16: qty 1

## 2024-01-16 MED ORDER — SODIUM CHLORIDE 0.9 % IV BOLUS
500.0000 mL | Freq: Once | INTRAVENOUS | Status: AC
  Administered 2024-01-16: 500 mL via INTRAVENOUS

## 2024-01-16 MED ORDER — FENTANYL CITRATE PF 50 MCG/ML IJ SOSY
50.0000 ug | PREFILLED_SYRINGE | Freq: Once | INTRAMUSCULAR | Status: AC
  Administered 2024-01-16: 50 ug via INTRAVENOUS
  Filled 2024-01-16: qty 1

## 2024-01-16 MED ORDER — IOHEXOL 350 MG/ML SOLN
100.0000 mL | Freq: Once | INTRAVENOUS | Status: AC | PRN
  Administered 2024-01-16: 100 mL via INTRAVENOUS

## 2024-01-16 MED ORDER — SODIUM CHLORIDE 0.9 % IV SOLN
1.0000 g | Freq: Once | INTRAVENOUS | Status: AC
  Administered 2024-01-16: 1 g via INTRAVENOUS
  Filled 2024-01-16: qty 10

## 2024-01-16 MED ORDER — LIDOCAINE 5 % EX PTCH: 1.0000 | MEDICATED_PATCH | Freq: Two times a day (BID) | CUTANEOUS | 0 refills | Status: DC

## 2024-01-16 MED ORDER — CEFPODOXIME PROXETIL 200 MG PO TABS: 200.0000 mg | ORAL_TABLET | Freq: Two times a day (BID) | ORAL | 0 refills | Status: AC

## 2024-01-16 MED ORDER — OXYCODONE-ACETAMINOPHEN 5-325 MG PO TABS: 1.0000 | ORAL_TABLET | ORAL | 0 refills | Status: DC | PRN

## 2024-01-16 MED ORDER — ONDANSETRON HCL 4 MG/2ML IJ SOLN
4.0000 mg | Freq: Once | INTRAMUSCULAR | Status: AC
  Administered 2024-01-16: 4 mg via INTRAVENOUS
  Filled 2024-01-16: qty 2

## 2024-01-16 NOTE — ED Provider Notes (Signed)
-----------------------------------------   3:53 PM on 01/16/2024 -----------------------------------------  Blood pressure (!) 115/50, pulse (!) 59, temperature 98.4 F (36.9 C), temperature source Oral, resp. rate 19, height 6' (1.829 m), weight 80.6 kg, SpO2 96%.  Assuming care from Dr. Peggi Bowels.  In short, Travis Schultz is a 89 y.o. male with a chief complaint of Abdominal Pain .  Refer to the original H&P for additional details.  The current plan of care is to follow-up CT dissection study results for abdominal and flank pain.  ----------------------------------------- 7:36 PM on 01/16/2024 ----------------------------------------- CT angiogram shows no evidence of aortic pathology, does show inflammatory changes around the bladder consistent with cystitis.  Urinalysis consistent with infection and we will send for culture, he was given dose of IV Rocephin here in the ED.  Pain improved on reassessment and he is appropriate for discharge home with outpatient follow-up on course of antibiotics.  He was counseled to return to the ED for new or worsening symptoms, patient agrees with plan.       Twilla Galea, MD 01/16/24 [phone]

## 2024-01-16 NOTE — ED Triage Notes (Signed)
 Pt here from [facility] with abd pain since this morning, radiating to his arm. Pt describes the pain as sharp and shooting. Pt intermittently moaning in triage. Pt denies NVD but states he had a recent UTI in March. Pt denies CP or SOB.

## 2024-01-16 NOTE — ED Notes (Addendum)
 Pt desatted to the 70% on RA after fentanyl administration, placed on 2L [place]. Sats are now 98%. Md notified

## 2024-01-16 NOTE — ED Triage Notes (Signed)
 First Nurse Note: Patient to ED from [facility] for RLQ abd pain. Had UTI in March.

## 2024-01-16 NOTE — ED Provider Notes (Signed)
 [facility] Provider Note    Event Date/Time   First MD Initiated Contact with Patient 01/16/24 1320     (approximate)   History   Abdominal Pain   HPI  Travis Schultz is a 89 y.o. male  CAD status post CABG, lymphoma, lumbar degenerative joint disease who comes in with abdominal pain.  Patient reports having right lower quadrant abdominal pain starting this morning and now the pain is radiating up into his left back and down his left arm.  He denies ever having anything like this previously.  Reports history of UTI and has urinary frequency mostly at nighttime that is at baseline for him.    Physical Exam   Triage Vital Signs: ED Triage Vitals  Encounter Vitals Group     BP 01/16/24 1309 (!) 97/53     Systolic BP Percentile --      Diastolic BP Percentile --      Pulse Rate 01/16/24 1309 93     Resp 01/16/24 1309 20     Temp 01/16/24 1309 98.4 F (36.9 C)     Temp Source 01/16/24 1309 Oral     SpO2 01/16/24 1309 97 %     Weight 01/16/24 1310 177 lb 11.1 oz (80.6 kg)     Height 01/16/24 1310 6' (1.829 m)     Head Circumference --      Peak Flow --      Pain Score 01/16/24 1310 10     Pain Loc --      Pain Education --      Exclude from Growth Chart --     Most recent vital signs: Vitals:   01/16/24 1309  BP: (!) 97/53  Pulse: 93  Resp: 20  Temp: 98.4 F (36.9 C)  SpO2: 97%     General: Awake, no distress.  CV:  Good peripheral perfusion.  Resp:  Normal effort.  Abd:  No distention.  Tender in the right lower abdomen with some minimal guarding.  No significant right upper quadrant tenderness.  No rash noted. Other:     ED Results / Procedures / Treatments   Labs (all labs ordered are listed, but only abnormal results are displayed) Labs Reviewed  LIPASE, BLOOD  COMPREHENSIVE METABOLIC PANEL WITH GFR  CBC  URINALYSIS, ROUTINE W REFLEX MICROSCOPIC     EKG  My interpretation of EKG:  Normal sinus rate of 62 without  any ST elevation, T wave inversion in V4 V5, right bundle branch block  RADIOLOGY Pending   PROCEDURES:  Critical Care performed: No  Procedures   MEDICATIONS ORDERED IN ED: Medications  fentaNYL (SUBLIMAZE) injection 25 mcg (25 mcg Intravenous Given 01/16/24 1344)  ondansetron (ZOFRAN) injection 4 mg (4 mg Intravenous Given 01/16/24 1344)  sodium chloride 0.9 % bolus 500 mL (500 mLs Intravenous New Bag/Given 01/16/24 1439)     IMPRESSION / MDM / ASSESSMENT AND PLAN / ED COURSE  I reviewed the triage vital signs and the nursing notes.   Patient's presentation is most consistent with acute presentation with potential threat to life or bodily function.   Differential includes ACS, appendicitis, diverticulitis, dissection given he does report that the pain is now migratory in nature I will get CT dissection.  I will give patient some IV fluids IV fentanyl IV Zofran to help with symptoms.  Initial troponin is negative.'s lipase normal CMP shows slight elevation of creatinine patient getting some fluids.  CBC shows stable hemoglobin.  2:56  PM patient still having pain after medications but blood pressure has come up.  Will give an additional dose.  Patient be handed off to oncoming team pending CT imaging  The patient is on the cardiac monitor to evaluate for evidence of arrhythmia and/or significant heart rate changes.      FINAL CLINICAL IMPRESSION(S) / ED DIAGNOSES   Final diagnoses:  Abdominal pain, unspecified abdominal location     Rx / DC Orders   ED Discharge Orders     None        Note:  This document was prepared using Dragon voice recognition software and may include unintentional dictation errors.   Lubertha Rush, MD 01/16/24 [phone]

## 2024-01-17 ENCOUNTER — Other Ambulatory Visit: Payer: Self-pay

## 2024-01-17 MED ORDER — AMOXICILLIN-POT CLAVULANATE 875-125 MG PO TABS: 1.0000 | ORAL_TABLET | Freq: Two times a day (BID) | ORAL | 0 refills | Status: DC

## 2024-01-18 DIAGNOSIS — R0789 Other chest pain: Secondary | ICD-10-CM | POA: Diagnosis not present

## 2024-01-18 LAB — URINE CULTURE: Culture: >=100000 — AB

## 2024-01-20 ENCOUNTER — Emergency Department

## 2024-01-20 ENCOUNTER — Emergency Department
Admission: EM | Admit: 2024-01-20 | Discharge: 2024-01-20 | Disposition: A | Discharge status: Home / Self Care | Attending: Emergency Medicine | Admitting: Emergency Medicine

## 2024-01-20 ENCOUNTER — Other Ambulatory Visit: Payer: Self-pay

## 2024-01-20 DIAGNOSIS — I2510 Atherosclerotic heart disease of native coronary artery without angina pectoris: Secondary | ICD-10-CM | POA: Insufficient documentation

## 2024-01-20 DIAGNOSIS — K5900 Constipation, unspecified: Secondary | ICD-10-CM | POA: Diagnosis not present

## 2024-01-20 DIAGNOSIS — R1084 Generalized abdominal pain: Secondary | ICD-10-CM | POA: Diagnosis present

## 2024-01-20 MED ORDER — POLYETHYLENE GLYCOL 3350 17 GM/SCOOP PO POWD
238.0000 g | Freq: Once | ORAL | 0 refills | Status: AC
Start: 2024-01-20 — End: 2024-01-20

## 2024-01-20 NOTE — Discharge Instructions (Addendum)
 Please use MiraLAX one half capful every hour until your first bowel movement.  Please do not take any MiraLAX after this for at least 24 hours.  You may use one half capful twice a day of MiraLAX in order to have 1 solid well-formed bowel movement per day.  You may increase or decrease this dosage as needed to obtain this 1 well-formed bowel movement.  Please make sure that you are drinking at least 8 ounces of water every hour during this initial bowel regimen. ?

## 2024-01-20 NOTE — ED Triage Notes (Signed)
 Pt to ED via POV from home. Pt reports unable to have BM x1wk. Pt reports at home remedies unsuccessful. Pt denies abd pain, N/V.

## 2024-01-20 NOTE — ED Provider Notes (Signed)
 [facility] Provider Note   Event Date/Time   First MD Initiated Contact with Patient 01/20/24 1559     (approximate) History  Constipation (Last BM 1wk ago)  HPI Travis Schultz is a 89 y.o. male with stated past medical history of constipation, chronic pain syndrome, lymphoma, CAD who presents complaining of 1 week without a BM.  Patient states that he has been using laxatives, stool softeners, and 1 enema this morning with no relief of his symptoms.  Patient endorses generalized abdominal pain that is worse with any p.o. intake.  Patient states that this pain is similar to previous episodes of constipation that he has had in the past ROS: Patient currently denies any vision changes, tinnitus, difficulty speaking, facial droop, sore throat, chest pain, shortness of breath, nausea/vomiting/diarrhea, dysuria, or weakness/numbness/paresthesias in any extremity   Physical Exam  Triage Vital Signs: ED Triage Vitals [01/20/24 1525]  Encounter Vitals Group     BP 123/66     Systolic BP Percentile      Diastolic BP Percentile      Pulse Rate 80     Resp 20     Temp (!) 97.5 F (36.4 C)     Temp Source Oral     SpO2 98 %     Weight      Height      Head Circumference      Peak Flow      Pain Score 0     Pain Loc      Pain Education      Exclude from Growth Chart    Most recent vital signs: Vitals:   01/20/24 1525  BP: 123/66  Pulse: 80  Resp: 20  Temp: (!) 97.5 F (36.4 C)  SpO2: 98%   General: Awake, oriented x4. CV:  Good peripheral perfusion.  Resp:  Normal effort.  Abd:  Mildly distended with generalized tenderness to palpation Other:  Elderly well-developed, well-nourished Caucasian male resting comfortably in no acute distress ED Results / Procedures / Treatments  Labs (all labs ordered are listed, but only abnormal results are displayed) Labs Reviewed - No data to display RADIOLOGY ED MD interpretation: 2 view x-ray of the abdomen  independently interpreted and shows moderate colonic stool burden -Agree with radiology assessment Official radiology report(s): No results found. PROCEDURES: Critical Care performed: No Procedures MEDICATIONS ORDERED IN ED: Medications - No data to display IMPRESSION / MDM / ASSESSMENT AND PLAN / ED COURSE  I reviewed the triage vital signs and the nursing notes.                             The patient is on the cardiac monitor to evaluate for evidence of arrhythmia and/or significant heart rate changes. Patient's presentation is most consistent with acute presentation with potential threat to life or bodily function. Patient's history and exam most consistent with constipation as an etiology for their pain.  Patient's symptoms not typical for other emergent causes of abdominal pain such as, but not limited to, appendicitis, abdominal aortic aneurysm, pancreatitis, SBO, mesenteric ischemia, serious intra-abdominal bacterial illness.  Patient without red flags concerning for cancer as a constipation etiology.  Rx: Miralax   Disposition:  Patient will be discharged with strict return precautions and follow up with primary MD within 24-48 hours for further evaluation. Patient understands that this still may have an early presentation of an emergent medical condition such as appendicitis  that will require a recheck.   FINAL CLINICAL IMPRESSION(S) / ED DIAGNOSES   Final diagnoses:  Constipation, unspecified constipation type   Rx / DC Orders   ED Discharge Orders          Ordered    polyethylene glycol powder (GLYCOLAX /MIRALAX ) 17 GM/SCOOP powder   Once        01/20/24 1647           Note:  This document was prepared using Dragon voice recognition software and may include unintentional dictation errors.   Charleen Conn, MD 01/20/24 [phone]

## 2024-01-31 ENCOUNTER — Ambulatory Visit: Admitting: Physician Assistant

## 2024-01-31 DIAGNOSIS — Z961 Presence of intraocular lens: Secondary | ICD-10-CM | POA: Diagnosis not present

## 2024-01-31 DIAGNOSIS — Z0100 Encounter for examination of eyes and vision without abnormal findings: Secondary | ICD-10-CM | POA: Diagnosis not present

## 2024-01-31 DIAGNOSIS — H353131 Nonexudative age-related macular degeneration, bilateral, early dry stage: Secondary | ICD-10-CM | POA: Diagnosis not present

## 2024-01-31 DIAGNOSIS — H353290 Exudative age-related macular degeneration, unspecified eye, stage unspecified: Secondary | ICD-10-CM | POA: Diagnosis not present

## 2024-02-02 ENCOUNTER — Ambulatory Visit (INDEPENDENT_AMBULATORY_CARE_PROVIDER_SITE_OTHER): Admitting: Physician Assistant

## 2024-02-02 VITALS — BP 99/51 | HR 90

## 2024-02-02 DIAGNOSIS — R339 Retention of urine, unspecified: Secondary | ICD-10-CM

## 2024-02-02 DIAGNOSIS — R32 Unspecified urinary incontinence: Secondary | ICD-10-CM | POA: Diagnosis not present

## 2024-02-02 LAB — BLADDER SCAN AMB NON-IMAGING: Scan Result: 400

## 2024-02-02 NOTE — Progress Notes (Signed)
 02/02/2024 10:11 AM   Travis Schultz 1926/09/19 [id number]  CC: Chief Complaint  Patient presents with   Follow-up   HPI: Travis Schultz is a 89 y.o. male with PMH prostate cancer s/p radical prostatectomy at [facility] in 1993, incomplete bladder emptying on Rapaflo , mixed urge and stress incontinence, nocturia, and constipation who presents today for symptom recheck on Gemtesa .  He is accompanied today by his friend, Travis Schultz, who contributes to HPI.  He was seen in the ED on 01/16/2024 with reports of abdominal pain.  CT angiogram showed inflammatory changes around the bladder and UA appeared positive.  He was discharged on cefpodoxime  200 mg twice daily x 10 days.  Urine culture grew ampicillin resistant Klebsiella pneumoniae.  He returned to the ED 4 days later with reports of 7 days of constipation and was discharged with a prescription for MiraLAX .  Today he reports no symptomatic improvement on Gemtesa .  He continues to experience continuous, insensate leaking, and wraps his penis in absorbent products to collect it.  He does not feel he empties his bladder well, however he denies abdominal/bladder pain today.   PVR , previously 104 mL.  PMH: Past Medical History:  Diagnosis Date   Actinic keratosis    Lymphoma (HCC) 1991   Myocardial infarction (HCC) 06/17/2009   5 bypass's    Surgical History: Past Surgical History:  Procedure Laterality Date   BACK SURGERY     after injury to back    CARDIAC SURGERY     CAROTID ARTERY ANGIOPLASTY N/A 02/24/2010   CATARACT EXTRACTION Bilateral 2012   EYE SURGERY Bilateral 2012   PROSTATE SURGERY     ROTATOR CUFF REPAIR Right 2008    Home Medications:  Allergies as of 02/02/2024       Reactions   Phenergan [promethazine Hcl]    Altered mental status   Promethazine Hcl         Medication List        Accurate as of Feb 02, 2024 10:11 AM. If you have any questions, ask your nurse or doctor.          acetaminophen   325 MG tablet Commonly known as: TYLENOL  Take 325 mg by mouth at bedtime.   amoxicillin -clavulanate 875-125 MG tablet Commonly known as: AUGMENTIN  Take 1 tablet by mouth 2 (two) times daily.   aspirin  81 MG tablet Take 81 mg by mouth at bedtime.   B-12 1000 MCG Tabs Take 1 tablet (1,000 mcg total) by mouth daily.   docusate sodium  100 MG capsule Commonly known as: COLACE Take 1 capsule by mouth as needed.   Ferrex 150 150 MG capsule Generic drug: iron  polysaccharides Take 1 capsule (150 mg total) by mouth daily.   Gemtesa  75 MG Tabs Generic drug: Vibegron  Take 1 tablet (75 mg total) by mouth daily.   ICaps Areds 2 Caps Take 1 capsule by mouth 2 (two) times daily.   isosorbide  mononitrate 30 MG 24 hr tablet Commonly known as: IMDUR  Take 30 mg by mouth daily.   lidocaine  5 % Commonly known as: Lidoderm  Place 1 patch onto the skin every 12 (twelve) hours. Remove & Discard patch within 12 hours or as directed by MD   naproxen sodium 220 MG tablet Commonly known as: ALEVE Take 220 mg by mouth daily.   nitroGLYCERIN 0.4 MG SL tablet Commonly known as: NITROSTAT Place 0.4 mg under the tongue every 5 (five) minutes as needed for chest pain.   pantoprazole  40 MG tablet  Commonly known as: PROTONIX  Take 1 tablet (40 mg total) by mouth 2 (two) times daily.   pravastatin  40 MG tablet Commonly known as: PRAVACHOL  Take 40 mg by mouth at bedtime.   silodosin  4 MG Caps capsule Commonly known as: RAPAFLO  Take 1 capsule (4 mg total) by mouth daily with breakfast.   Vitamin D  (Ergocalciferol ) 1.25 MG (50000 UNIT) Caps capsule Commonly known as: DRISDOL  Take 1 capsule (50,000 Units total) by mouth every 7 (seven) days.        Allergies:  Allergies  Allergen Reactions   Phenergan [Promethazine Hcl]     Altered mental status   Promethazine Hcl     Family History: Family History  Problem Relation Age of Onset   Heart disease Father    Heart disease Brother      Social History:   reports that he has never smoked. He has never used smokeless tobacco. He reports that he does not drink alcohol and does not use drugs.  Physical Exam: BP (!) 99/51   Pulse 90   Constitutional:  Alert and oriented, no acute distress, nontoxic appearing HEENT: [place], AT Cardiovascular: No clubbing, cyanosis, or edema Respiratory: Normal respiratory effort, no increased work of breathing Skin: No rashes, bruises or suspicious lesions Neurologic: Grossly intact, no focal deficits, moving all 4 extremities Psychiatric: Normal mood and affect  Laboratory Data: Results for orders placed or performed in visit on 02/02/24  BLADDER SCAN AMB NON-IMAGING   Collection Time: 02/02/24 10:25 AM  Result Value Ref Range   Scan Result 400 ml    Assessment & Plan:   1. Incomplete bladder emptying (Primary) PVR increased over prior, likely multifactorial in the setting of Gemtesa , constipation, and recent UTI.  Fortunately, he is asymptomatic and I do not consider this acute urinary retention.  Will consider Foley catheter drainage if his residuals remain elevated or if he becomes uncomfortable.  We had a lengthy conversation about options today including cystoscopy, as a question possible bladder neck contracture.  He is not particularly motivated to pursue invasive testing or treatment given his age, which is reasonable.  Will make adjustments as below and continue to monitor. - BLADDER SCAN AMB NON-IMAGING  2. Urinary incontinence, unspecified type No improvement on Gemtesa  and increased PVR as above; will stop Gemtesa .  Will see him back in a month for symptom recheck and PVR.  If his residuals remain reasonable, may defer cystoscopy in favor of improved management of his urinary leakage including possible condom catheter.  Return in about 4 weeks (around 03/01/2024) for UA, PVR.  Kathreen Pare, PA-C  [facility] [place] [address], [address] [place], [place] [address] [phone]

## 2024-02-05 DIAGNOSIS — H353211 Exudative age-related macular degeneration, right eye, with active choroidal neovascularization: Secondary | ICD-10-CM | POA: Diagnosis not present

## 2024-02-05 DIAGNOSIS — Z961 Presence of intraocular lens: Secondary | ICD-10-CM | POA: Diagnosis not present

## 2024-02-05 DIAGNOSIS — H353131 Nonexudative age-related macular degeneration, bilateral, early dry stage: Secondary | ICD-10-CM | POA: Diagnosis not present

## 2024-02-12 DIAGNOSIS — H353211 Exudative age-related macular degeneration, right eye, with active choroidal neovascularization: Secondary | ICD-10-CM | POA: Diagnosis not present

## 2024-03-08 ENCOUNTER — Encounter: Payer: Self-pay | Admitting: Physician Assistant

## 2024-03-08 ENCOUNTER — Ambulatory Visit (INDEPENDENT_AMBULATORY_CARE_PROVIDER_SITE_OTHER): Admitting: Physician Assistant

## 2024-03-08 VITALS — BP 136/66 | HR 76 | Ht 72.0 in | Wt 178.0 lb

## 2024-03-08 DIAGNOSIS — R3129 Other microscopic hematuria: Secondary | ICD-10-CM

## 2024-03-08 DIAGNOSIS — R339 Retention of urine, unspecified: Secondary | ICD-10-CM

## 2024-03-08 LAB — URINALYSIS, COMPLETE
Bilirubin, UA: NEGATIVE
Glucose, UA: NEGATIVE
Ketones, UA: NEGATIVE
Leukocytes,UA: NEGATIVE
Nitrite, UA: NEGATIVE
Protein,UA: NEGATIVE
Specific Gravity, UA: 1.01 (ref 1.005–1.030)
Urobilinogen, Ur: 0.2 mg/dL (ref 0.2–1.0)
pH, UA: 6 (ref 5.0–7.5)

## 2024-03-08 LAB — MICROSCOPIC EXAMINATION

## 2024-03-08 LAB — BLADDER SCAN AMB NON-IMAGING

## 2024-03-08 NOTE — Patient Instructions (Signed)

## 2024-03-08 NOTE — Progress Notes (Signed)
 03/08/2024 10:14 AM   Aleene Hurry 04/13/1926 [id number]  CC: Chief Complaint  Patient presents with   Follow-up   HPI: Travis Schultz is a 89 y.o. male with PMH prostate cancer s/p radical prostatectomy at [facility] in 1993, incomplete bladder emptying on Rapaflo , mixed urge and stress incontinence, nocturia, and constipation who presents today for symptom recheck off Gemtesa .  He is accompanied today by his friend, Deatra Face, who contributes to HPI.  Today he reports no significant change since stopping Gemtesa .  He continues to have some urinary leakage and occasional discomfort in his lower abdomen.  In-office UA today positive for trace intact blood; urine microscopy with 3-10 RBCs/HPF. PVR .  PMH: Past Medical History:  Diagnosis Date   Actinic keratosis    Lymphoma (HCC) 1991   Myocardial infarction (HCC) 06/17/2009   5 bypass's    Surgical History: Past Surgical History:  Procedure Laterality Date   BACK SURGERY     after injury to back    CARDIAC SURGERY     CAROTID ARTERY ANGIOPLASTY N/A 02/24/2010   CATARACT EXTRACTION Bilateral 2012   EYE SURGERY Bilateral 2012   PROSTATE SURGERY     ROTATOR CUFF REPAIR Right 2008    Home Medications:  Allergies as of 03/08/2024       Reactions   Phenergan [promethazine Hcl]    Altered mental status   Promethazine Hcl         Medication List        Accurate as of March 08, 2024 10:14 AM. If you have any questions, ask your nurse or doctor.          STOP taking these medications    acetaminophen  325 MG tablet Commonly known as: TYLENOL  Stopped by: Wafa Martes   amoxicillin -clavulanate 875-125 MG tablet Commonly known as: AUGMENTIN  Stopped by: Sirinity Outland   oxyCODONE -acetaminophen  5-325 MG tablet Commonly known as: PERCOCET/ROXICET Stopped by: Jadrian Bulman   Vitamin D  (Ergocalciferol ) 1.25 MG (50000 UNIT) Caps capsule Commonly known as: DRISDOL  Stopped by: Kathreen Pare       TAKE these medications    aspirin  81 MG tablet Take 81 mg by mouth at bedtime.   B-12 1000 MCG Tabs Take 1 tablet (1,000 mcg total) by mouth daily.   docusate sodium  100 MG capsule Commonly known as: COLACE Take 1 capsule by mouth as needed.   Ferrex 150 150 MG capsule Generic drug: iron  polysaccharides Take 1 capsule (150 mg total) by mouth daily.   ICaps Areds 2 Caps Take 1 capsule by mouth 2 (two) times daily.   isosorbide  mononitrate 30 MG 24 hr tablet Commonly known as: IMDUR  Take 30 mg by mouth daily.   naproxen sodium 220 MG tablet Commonly known as: ALEVE Take 220 mg by mouth daily.   nitroGLYCERIN 0.4 MG SL tablet Commonly known as: NITROSTAT Place 0.4 mg under the tongue every 5 (five) minutes as needed for chest pain.   pantoprazole  40 MG tablet Commonly known as: PROTONIX  Take 1 tablet (40 mg total) by mouth 2 (two) times daily.   pravastatin  40 MG tablet Commonly known as: PRAVACHOL  Take 40 mg by mouth at bedtime.   silodosin  4 MG Caps capsule Commonly known as: RAPAFLO  Take 1 capsule (4 mg total) by mouth daily with breakfast.        Allergies:  Allergies  Allergen Reactions   Phenergan [Promethazine Hcl]     Altered mental status   Promethazine Hcl  Family History: Family History  Problem Relation Age of Onset   Heart disease Father    Heart disease Brother     Social History:   reports that he has never smoked. He has never used smokeless tobacco. He reports that he does not drink alcohol and does not use drugs.  Physical Exam: BP 136/66   Pulse 76   Ht 6' (1.829 m)   Wt 178 lb (80.7 kg)   BMI 24.14 kg/m   Constitutional:  Alert and oriented, no acute distress, nontoxic appearing HEENT: [place], AT Cardiovascular: No clubbing, cyanosis, or edema Respiratory: Normal respiratory effort, no increased work of breathing Skin: No rashes, bruises or suspicious lesions Neurologic: Grossly intact, no focal  deficits, moving all 4 extremities Psychiatric: Normal mood and affect  Laboratory Data: Results for orders placed or performed in visit on 03/08/24  Microscopic Examination   Collection Time: 03/08/24  9:42 AM   Urine  Result Value Ref Range   WBC, UA WILL FOLLOW    RBC, Urine WILL FOLLOW    Epithelial Cells (non renal) WILL FOLLOW    Renal Epithel, UA WILL FOLLOW    Casts WILL FOLLOW    Cast Type WILL FOLLOW    Crystals WILL FOLLOW    Crystal Type WILL FOLLOW    Mucus, UA WILL FOLLOW    Bacteria, UA WILL FOLLOW    Yeast, UA WILL FOLLOW    Trichomonas, UA WILL FOLLOW    Urinalysis Comments WILL FOLLOW   Urinalysis, Complete   Collection Time: 03/08/24  9:42 AM  Result Value Ref Range   Specific Gravity, UA 1.010 1.005 - 1.030   pH, UA 6.0 5.0 - 7.5   Color, UA Yellow Yellow   Appearance Ur Clear Clear   Leukocytes,UA Negative Negative   Protein,UA Negative Negative/Trace   Glucose, UA Negative Negative   Ketones, UA Negative Negative   RBC, UA Trace (A) Negative   Bilirubin, UA Negative Negative   Urobilinogen, Ur 0.2 0.2 - 1.0 mg/dL   Nitrite, UA Negative Negative   Microscopic Examination See below:   BLADDER SCAN AMB NON-IMAGING   Collection Time: 03/08/24 10:12 AM  Result Value Ref Range   Scan Result    Assessment & Plan:   1. Incomplete bladder emptying (Primary) Residual is stably elevated after stopping Gemtesa .  He is having overflow incontinence and intermittent bladder discomfort.  I recommended cystoscopy for evaluation of possible bladder neck contracture.  I offered him attempted Foley catheter placement, however he prefers to defer this, which is reasonable.  I did ask him to start timed voiding every 2 hours during his waking hours to optimize his emptying. - Urinalysis, Complete - BLADDER SCAN AMB NON-IMAGING  2. Microscopic hematuria Possibly secondary to #1 above.  Will pursue cystoscopy.  Return in about 4 weeks (around 04/05/2024) for  Cysto with Dr. Cherylene Corrente.  Kathreen Pare, PA-C  [facility] [place] [address] [place], [place] [address] [phone]

## 2024-03-09 ENCOUNTER — Other Ambulatory Visit: Payer: Self-pay | Admitting: Physician Assistant

## 2024-03-09 DIAGNOSIS — R339 Retention of urine, unspecified: Secondary | ICD-10-CM

## 2024-03-19 DIAGNOSIS — H353211 Exudative age-related macular degeneration, right eye, with active choroidal neovascularization: Secondary | ICD-10-CM | POA: Diagnosis not present

## 2024-03-21 ENCOUNTER — Ambulatory Visit: Payer: Self-pay | Admitting: Physician Assistant

## 2024-04-11 ENCOUNTER — Ambulatory Visit: Admitting: Urology

## 2024-04-11 ENCOUNTER — Encounter: Payer: Self-pay | Admitting: Urology

## 2024-04-11 ENCOUNTER — Other Ambulatory Visit: Payer: Self-pay | Admitting: Physician Assistant

## 2024-04-11 VITALS — BP 108/80 | HR 84 | Ht 72.0 in | Wt 175.0 lb

## 2024-04-11 DIAGNOSIS — R339 Retention of urine, unspecified: Secondary | ICD-10-CM

## 2024-04-11 LAB — MICROSCOPIC EXAMINATION

## 2024-04-11 LAB — URINALYSIS, COMPLETE
Bilirubin, UA: NEGATIVE
Glucose, UA: NEGATIVE
Ketones, UA: NEGATIVE
Leukocytes,UA: NEGATIVE
Nitrite, UA: NEGATIVE
Protein,UA: NEGATIVE
Specific Gravity, UA: 1.025 (ref 1.005–1.030)
Urobilinogen, Ur: 0.2 mg/dL (ref 0.2–1.0)
pH, UA: 6 (ref 5.0–7.5)

## 2024-04-11 NOTE — Progress Notes (Signed)
   04/11/24  CC:  Chief Complaint  Patient presents with   Cysto    HPI: Refer to Travis Schultz's prior note 03/08/2024.  He feels his voiding symptoms have slightly improved  Blood pressure 108/80, pulse 84, height 6' (1.829 m), weight 175 lb (79.4 kg).   Cystoscopy Procedure Note  Patient identification was confirmed, informed consent was obtained, and patient was prepped using Betadine solution.  Lidocaine  jelly was administered per urethral meatus.     Pre-Procedure: - Inspection reveals a normal caliber urethral meatus.  Procedure: The flexible cystoscope was introduced without difficulty - No urethral strictures/lesions are present; slightly tight just proximal to the sphincter but the scope easily advances and no stricture present - Surgically absent prostate  - Normal bladder neck - Bilateral ureteral orifices identified - Bladder mucosa  reveals no ulcers, tumors, or lesions - No bladder stones - No trabeculation  Retroflexion shows no abnormalities   Post-Procedure: - Patient tolerated the procedure well  Assessment/ Plan: No urethral stricture or bladder neck contracture noted It is possible passage of the cystoscope may improve his voiding symptoms 1 month follow-up for repeat PVR    Travis JAYSON Barba, MD

## 2024-04-24 DIAGNOSIS — Z0000 Encounter for general adult medical examination without abnormal findings: Secondary | ICD-10-CM | POA: Diagnosis not present

## 2024-04-24 DIAGNOSIS — I110 Hypertensive heart disease with heart failure: Secondary | ICD-10-CM | POA: Diagnosis not present

## 2024-04-24 DIAGNOSIS — I2510 Atherosclerotic heart disease of native coronary artery without angina pectoris: Secondary | ICD-10-CM | POA: Diagnosis not present

## 2024-04-24 DIAGNOSIS — Z1331 Encounter for screening for depression: Secondary | ICD-10-CM | POA: Diagnosis not present

## 2024-04-24 DIAGNOSIS — M48061 Spinal stenosis, lumbar region without neurogenic claudication: Secondary | ICD-10-CM | POA: Diagnosis not present

## 2024-04-24 DIAGNOSIS — I6523 Occlusion and stenosis of bilateral carotid arteries: Secondary | ICD-10-CM | POA: Diagnosis not present

## 2024-04-24 DIAGNOSIS — R7303 Prediabetes: Secondary | ICD-10-CM | POA: Diagnosis not present

## 2024-04-24 DIAGNOSIS — I5022 Chronic systolic (congestive) heart failure: Secondary | ICD-10-CM | POA: Diagnosis not present

## 2024-04-29 DIAGNOSIS — H353211 Exudative age-related macular degeneration, right eye, with active choroidal neovascularization: Secondary | ICD-10-CM | POA: Diagnosis not present

## 2024-05-13 ENCOUNTER — Ambulatory Visit: Admitting: Physician Assistant

## 2024-05-13 ENCOUNTER — Encounter: Payer: Self-pay | Admitting: Physician Assistant

## 2024-05-13 VITALS — BP 91/47 | HR 78 | Ht 72.0 in | Wt 175.0 lb

## 2024-05-13 DIAGNOSIS — R351 Nocturia: Secondary | ICD-10-CM | POA: Diagnosis not present

## 2024-05-13 DIAGNOSIS — R339 Retention of urine, unspecified: Secondary | ICD-10-CM | POA: Diagnosis not present

## 2024-05-13 DIAGNOSIS — R1030 Lower abdominal pain, unspecified: Secondary | ICD-10-CM | POA: Diagnosis not present

## 2024-05-13 LAB — BLADDER SCAN AMB NON-IMAGING

## 2024-05-13 NOTE — Progress Notes (Signed)
 05/13/2024 9:19 AM   Nancyann JINNY Rands Dec 05, 1925 [id number]  CC: Chief Complaint  Patient presents with   Follow-up   HPI: HRIDHAAN YOHN is a 89 y.o. male with PMH prostate cancer s/p radical prostatectomy at [facility] in 1993, incomplete bladder emptying on Rapaflo , mixed urge and stress incontinence who failed Gemtesa , nocturia, and constipation who presents today for symptom recheck after cystoscopy with Dr. Twylla.  He is accompanied today by Bruna, who contributes to HPI.  Notably, there was no urethral strictures noted on cystoscopy, however he did have some slight tightness proximal to the sphincter.  Today he reports intermittent low back and bilateral lower abdominal pain.  He is unsure if these 2 pains are related.  He has been taking prunes for his constipation and states his bowels are well-controlled.  He also describes bothersome nocturia every 1-2 hours.  He exercises about an hour each day and notes that he tends to feel much better after he works out.  PVR , previously 467 mL.  PMH: Past Medical History:  Diagnosis Date   Actinic keratosis    Lymphoma (HCC) 1991   Myocardial infarction (HCC) 06/17/2009   5 bypass's    Surgical History: Past Surgical History:  Procedure Laterality Date   BACK SURGERY     after injury to back    CARDIAC SURGERY     CAROTID ARTERY ANGIOPLASTY N/A 02/24/2010   CATARACT EXTRACTION Bilateral 2012   EYE SURGERY Bilateral 2012   PROSTATE SURGERY     ROTATOR CUFF REPAIR Right 2008    Home Medications:  Allergies as of 05/13/2024       Reactions   Phenergan [promethazine Hcl]    Altered mental status   Promethazine Hcl         Medication List        Accurate as of May 13, 2024  9:19 AM. If you have any questions, ask your nurse or doctor.          aspirin  81 MG tablet Take 81 mg by mouth at bedtime.   docusate sodium  100 MG capsule Commonly known as: COLACE Take 1 capsule by mouth as needed.    Ferrex 150 150 MG capsule Generic drug: iron  polysaccharides Take 1 capsule (150 mg total) by mouth daily.   ICaps Areds 2 Caps Take 1 capsule by mouth 2 (two) times daily.   isosorbide  mononitrate 30 MG 24 hr tablet Commonly known as: IMDUR  Take 30 mg by mouth daily.   nitroGLYCERIN 0.4 MG SL tablet Commonly known as: NITROSTAT Place 0.4 mg under the tongue every 5 (five) minutes as needed for chest pain.   pantoprazole  40 MG tablet Commonly known as: PROTONIX  Take 1 tablet (40 mg total) by mouth 2 (two) times daily.   pravastatin  40 MG tablet Commonly known as: PRAVACHOL  Take 40 mg by mouth at bedtime.   silodosin  4 MG Caps capsule Commonly known as: RAPAFLO  TAKE 1 CAPSULE BY MOUTH DAILY WITH BREAKFAST.        Allergies:  Allergies  Allergen Reactions   Phenergan [Promethazine Hcl]     Altered mental status   Promethazine Hcl     Family History: Family History  Problem Relation Age of Onset   Heart disease Father    Heart disease Brother     Social History:   reports that he has never smoked. He has never used smokeless tobacco. He reports that he does not drink alcohol and does not use drugs.  Physical Exam: BP (!) 91/47   Pulse 78   Ht 6' (1.829 m)   Wt 175 lb (79.4 kg)   SpO2 98%   BMI 23.73 kg/m   Constitutional:  Alert and oriented, no acute distress, nontoxic appearing HEENT: [place], AT Cardiovascular: No clubbing, cyanosis, or edema Respiratory: Normal respiratory effort, no increased work of breathing GI: Abdomen is soft, nontender, nonpalpable bladder Skin: No rashes, bruises or suspicious lesions Neurologic: Grossly intact, no focal deficits, moving all 4 extremities Psychiatric: Normal mood and affect  Laboratory Data: Results for orders placed or performed in visit on 05/13/24  BLADDER SCAN AMB NON-IMAGING   Collection Time: 05/13/24  9:18 AM  Result Value Ref Range   Scan Result    Assessment & Plan:   1. Incomplete bladder  emptying (Primary) PVR significantly improved over prior.  If his residuals rises again, may consider CIC in the future to see if this helps with his abdominal pain, see below.  He does not wish to pursue CIC at this time, and based on today's PVR, I do not think this is necessary. - BLADDER SCAN AMB NON-IMAGING  2. Lower abdominal pain Question radicular back pain.  Our conversation today was a bit circular, but ultimately I suggested that if he addresses the back pain and the abdominal pain gets better, then we can be confident that these 2 things are linked.  I will reach out to Dr. Lenon about optimizing his pain regimen.  3. Nocturia No BLE edema on exam today.  I explained that I am hesitant to put him on OAB meds, as this will worsen his incomplete emptying.  Desmopressin contraindicated due to age and history of hyponatremia.  Unfortunately, I do not have anything to offer at this time.  Return in about 6 months (around 11/13/2024) for UA, PVR.  Lucie Hones, PA-C  [facility]  [address], [address] [place], [place] [address] [phone]

## 2024-06-13 DIAGNOSIS — E782 Mixed hyperlipidemia: Secondary | ICD-10-CM | POA: Diagnosis not present

## 2024-06-13 DIAGNOSIS — I10 Essential (primary) hypertension: Secondary | ICD-10-CM | POA: Diagnosis not present

## 2024-06-13 DIAGNOSIS — I5022 Chronic systolic (congestive) heart failure: Secondary | ICD-10-CM | POA: Diagnosis not present

## 2024-06-13 DIAGNOSIS — I25118 Atherosclerotic heart disease of native coronary artery with other forms of angina pectoris: Secondary | ICD-10-CM | POA: Diagnosis not present

## 2024-06-25 DIAGNOSIS — H353211 Exudative age-related macular degeneration, right eye, with active choroidal neovascularization: Secondary | ICD-10-CM | POA: Diagnosis not present

## 2024-08-05 DIAGNOSIS — Z961 Presence of intraocular lens: Secondary | ICD-10-CM | POA: Diagnosis not present

## 2024-08-05 DIAGNOSIS — H353211 Exudative age-related macular degeneration, right eye, with active choroidal neovascularization: Secondary | ICD-10-CM | POA: Diagnosis not present

## 2024-08-22 DIAGNOSIS — H353211 Exudative age-related macular degeneration, right eye, with active choroidal neovascularization: Secondary | ICD-10-CM | POA: Diagnosis not present

## 2024-11-13 ENCOUNTER — Ambulatory Visit: Admitting: Physician Assistant
